# Patient Record
Sex: Female | Born: 1970
Health system: Southern US, Community
[De-identification: ages and names within clinical notes are randomized; demographics above are authoritative.]

## PROBLEM LIST (undated history)

## (undated) DIAGNOSIS — F32A Depression, unspecified: Secondary | ICD-10-CM

## (undated) DIAGNOSIS — E039 Hypothyroidism, unspecified: Secondary | ICD-10-CM

## (undated) DIAGNOSIS — N760 Acute vaginitis: Secondary | ICD-10-CM

## (undated) DIAGNOSIS — E079 Disorder of thyroid, unspecified: Secondary | ICD-10-CM

## (undated) DIAGNOSIS — Z803 Family history of malignant neoplasm of breast: Secondary | ICD-10-CM

## (undated) DIAGNOSIS — I10 Essential (primary) hypertension: Secondary | ICD-10-CM

## (undated) DIAGNOSIS — B9689 Other specified bacterial agents as the cause of diseases classified elsewhere: Secondary | ICD-10-CM

## (undated) DIAGNOSIS — T7840XA Allergy, unspecified, initial encounter: Secondary | ICD-10-CM

## (undated) DIAGNOSIS — G709 Myoneural disorder, unspecified: Secondary | ICD-10-CM

## (undated) DIAGNOSIS — K589 Irritable bowel syndrome without diarrhea: Secondary | ICD-10-CM

## (undated) DIAGNOSIS — E785 Hyperlipidemia, unspecified: Secondary | ICD-10-CM

## (undated) DIAGNOSIS — C801 Malignant (primary) neoplasm, unspecified: Principal | ICD-10-CM

## (undated) DIAGNOSIS — R011 Cardiac murmur, unspecified: Secondary | ICD-10-CM

## (undated) DIAGNOSIS — E119 Type 2 diabetes mellitus without complications: Secondary | ICD-10-CM

## (undated) DIAGNOSIS — F419 Anxiety disorder, unspecified: Secondary | ICD-10-CM

## (undated) DIAGNOSIS — M1990 Unspecified osteoarthritis, unspecified site: Secondary | ICD-10-CM

## (undated) DIAGNOSIS — H814 Vertigo of central origin: Secondary | ICD-10-CM

## (undated) HISTORY — DX: Acute vaginitis: B96.89

## (undated) HISTORY — DX: Allergy, unspecified, initial encounter: T78.40XA

## (undated) HISTORY — DX: Unspecified osteoarthritis, unspecified site: M19.90

## (undated) HISTORY — DX: Type 2 diabetes mellitus without complications: E11.9

## (undated) HISTORY — DX: Malignant (primary) neoplasm, unspecified: C80.1

## (undated) HISTORY — DX: Myoneural disorder, unspecified: G70.9

## (undated) HISTORY — PX: THYROIDECTOMY: SHX17

## (undated) HISTORY — DX: Cardiac murmur, unspecified: R01.1

## (undated) HISTORY — DX: Other specified bacterial agents as the cause of diseases classified elsewhere: B96.89

## (undated) HISTORY — DX: Essential (primary) hypertension: I10

## (undated) HISTORY — DX: Other specified bacterial agents as the cause of diseases classified elsewhere: N76.0

## (undated) HISTORY — PX: CARPAL TUNNEL RELEASE: SHX101

## (undated) HISTORY — DX: Family history of malignant neoplasm of breast: Z80.3

## (undated) HISTORY — DX: Hyperlipidemia, unspecified: E78.5

## (undated) HISTORY — PX: ABLATION: SHX5711

## (undated) HISTORY — DX: Disorder of thyroid, unspecified: E07.9

## (undated) HISTORY — DX: Irritable bowel syndrome, unspecified: K58.9

## (undated) HISTORY — PX: OTHER SURGICAL HISTORY: SHX169

## (undated) HISTORY — PX: WISDOM TOOTH EXTRACTION: SHX21

---

## 1998-07-09 ENCOUNTER — Emergency Department (HOSPITAL_COMMUNITY): Admission: EM | Admit: 1998-07-09 | Discharge: 1998-07-09 | Payer: Self-pay

## 2001-03-18 ENCOUNTER — Emergency Department (HOSPITAL_COMMUNITY): Admission: EM | Admit: 2001-03-18 | Discharge: 2001-03-18 | Payer: Self-pay | Admitting: Emergency Medicine

## 2003-02-11 ENCOUNTER — Encounter: Payer: Self-pay | Admitting: Family Medicine

## 2003-02-11 ENCOUNTER — Ambulatory Visit (HOSPITAL_COMMUNITY): Admission: RE | Admit: 2003-02-11 | Discharge: 2003-02-11 | Payer: Self-pay | Admitting: Family Medicine

## 2007-01-18 ENCOUNTER — Ambulatory Visit (HOSPITAL_COMMUNITY): Admission: AD | Admit: 2007-01-18 | Discharge: 2007-01-18 | Payer: Self-pay | Admitting: Obstetrics and Gynecology

## 2007-01-26 ENCOUNTER — Encounter (INDEPENDENT_AMBULATORY_CARE_PROVIDER_SITE_OTHER): Payer: Self-pay | Admitting: *Deleted

## 2007-01-26 ENCOUNTER — Inpatient Hospital Stay (HOSPITAL_COMMUNITY): Admission: RE | Admit: 2007-01-26 | Discharge: 2007-01-28 | Payer: Self-pay | Admitting: Obstetrics & Gynecology

## 2007-12-06 DIAGNOSIS — C801 Malignant (primary) neoplasm, unspecified: Secondary | ICD-10-CM

## 2007-12-06 HISTORY — DX: Malignant (primary) neoplasm, unspecified: C80.1

## 2007-12-31 ENCOUNTER — Encounter (HOSPITAL_COMMUNITY): Admission: RE | Admit: 2007-12-31 | Discharge: 2008-01-30 | Payer: Self-pay | Admitting: Internal Medicine

## 2008-01-08 ENCOUNTER — Ambulatory Visit (HOSPITAL_COMMUNITY): Admission: RE | Admit: 2008-01-08 | Discharge: 2008-01-08 | Payer: Self-pay | Admitting: Family Medicine

## 2008-01-09 ENCOUNTER — Encounter (INDEPENDENT_AMBULATORY_CARE_PROVIDER_SITE_OTHER): Payer: Self-pay | Admitting: Diagnostic Radiology

## 2008-05-01 ENCOUNTER — Other Ambulatory Visit: Admission: RE | Admit: 2008-05-01 | Discharge: 2008-05-01 | Payer: Self-pay | Admitting: Obstetrics & Gynecology

## 2008-05-23 ENCOUNTER — Ambulatory Visit (HOSPITAL_COMMUNITY): Admission: RE | Admit: 2008-05-23 | Discharge: 2008-05-23 | Payer: Self-pay | Admitting: Obstetrics & Gynecology

## 2008-05-23 ENCOUNTER — Encounter: Payer: Self-pay | Admitting: Obstetrics & Gynecology

## 2008-07-21 ENCOUNTER — Ambulatory Visit (HOSPITAL_COMMUNITY): Admission: RE | Admit: 2008-07-21 | Discharge: 2008-07-22 | Payer: Self-pay | Admitting: General Surgery

## 2008-07-21 ENCOUNTER — Encounter (HOSPITAL_BASED_OUTPATIENT_CLINIC_OR_DEPARTMENT_OTHER): Payer: Self-pay | Admitting: General Surgery

## 2008-09-16 ENCOUNTER — Encounter (HOSPITAL_BASED_OUTPATIENT_CLINIC_OR_DEPARTMENT_OTHER): Payer: Self-pay | Admitting: General Surgery

## 2008-09-16 ENCOUNTER — Ambulatory Visit (HOSPITAL_COMMUNITY): Admission: RE | Admit: 2008-09-16 | Discharge: 2008-09-17 | Payer: Self-pay | Admitting: General Surgery

## 2008-10-10 ENCOUNTER — Ambulatory Visit (HOSPITAL_COMMUNITY): Admission: RE | Admit: 2008-10-10 | Discharge: 2008-10-10 | Payer: Self-pay | Admitting: Endocrinology

## 2008-10-15 ENCOUNTER — Encounter (HOSPITAL_COMMUNITY): Admission: RE | Admit: 2008-10-15 | Discharge: 2008-12-02 | Payer: Self-pay | Admitting: Internal Medicine

## 2008-10-17 ENCOUNTER — Encounter (HOSPITAL_COMMUNITY): Admission: RE | Admit: 2008-10-17 | Discharge: 2008-12-02 | Payer: Self-pay | Admitting: Endocrinology

## 2009-04-16 ENCOUNTER — Ambulatory Visit (HOSPITAL_COMMUNITY): Admission: RE | Admit: 2009-04-16 | Discharge: 2009-04-16 | Payer: Self-pay | Admitting: Obstetrics & Gynecology

## 2009-05-07 ENCOUNTER — Other Ambulatory Visit: Admission: RE | Admit: 2009-05-07 | Discharge: 2009-05-07 | Payer: Self-pay | Admitting: Obstetrics & Gynecology

## 2009-09-14 ENCOUNTER — Ambulatory Visit (HOSPITAL_COMMUNITY): Admission: RE | Admit: 2009-09-14 | Discharge: 2009-09-14 | Payer: Self-pay | Admitting: Family Medicine

## 2009-12-21 ENCOUNTER — Encounter (HOSPITAL_COMMUNITY): Admission: RE | Admit: 2009-12-21 | Discharge: 2010-03-17 | Payer: Self-pay | Admitting: Endocrinology

## 2010-07-01 ENCOUNTER — Other Ambulatory Visit: Admission: RE | Admit: 2010-07-01 | Discharge: 2010-07-01 | Payer: Self-pay | Admitting: Obstetrics & Gynecology

## 2010-11-22 ENCOUNTER — Encounter (HOSPITAL_COMMUNITY)
Admission: RE | Admit: 2010-11-22 | Discharge: 2011-01-04 | Payer: Self-pay | Source: Home / Self Care | Attending: Endocrinology | Admitting: Endocrinology

## 2010-12-26 ENCOUNTER — Encounter: Payer: Self-pay | Admitting: Obstetrics and Gynecology

## 2010-12-26 ENCOUNTER — Encounter: Payer: Self-pay | Admitting: Internal Medicine

## 2011-01-10 ENCOUNTER — Other Ambulatory Visit (HOSPITAL_COMMUNITY): Payer: Self-pay | Admitting: Internal Medicine

## 2011-01-10 ENCOUNTER — Ambulatory Visit (HOSPITAL_COMMUNITY)
Admission: RE | Admit: 2011-01-10 | Discharge: 2011-01-10 | Disposition: A | Discharge status: Home / Self Care | Payer: 59 | Source: Ambulatory Visit | Attending: Internal Medicine | Admitting: Internal Medicine

## 2011-01-10 DIAGNOSIS — W19XXXA Unspecified fall, initial encounter: Secondary | ICD-10-CM | POA: Insufficient documentation

## 2011-01-10 DIAGNOSIS — M79644 Pain in right finger(s): Secondary | ICD-10-CM

## 2011-01-10 DIAGNOSIS — S6990XA Unspecified injury of unspecified wrist, hand and finger(s), initial encounter: Secondary | ICD-10-CM | POA: Insufficient documentation

## 2011-01-10 DIAGNOSIS — M79609 Pain in unspecified limb: Secondary | ICD-10-CM | POA: Insufficient documentation

## 2011-01-10 DIAGNOSIS — S6980XA Other specified injuries of unspecified wrist, hand and finger(s), initial encounter: Secondary | ICD-10-CM | POA: Insufficient documentation

## 2011-02-14 LAB — HCG, SERUM, QUALITATIVE

## 2011-03-31 ENCOUNTER — Other Ambulatory Visit: Payer: Self-pay | Admitting: Obstetrics & Gynecology

## 2011-03-31 DIAGNOSIS — Z139 Encounter for screening, unspecified: Secondary | ICD-10-CM

## 2011-04-07 ENCOUNTER — Ambulatory Visit (HOSPITAL_COMMUNITY)
Admission: RE | Admit: 2011-04-07 | Discharge: 2011-04-07 | Disposition: A | Discharge status: Home / Self Care | Payer: 59 | Source: Ambulatory Visit | Attending: Obstetrics & Gynecology | Admitting: Obstetrics & Gynecology

## 2011-04-07 DIAGNOSIS — Z1231 Encounter for screening mammogram for malignant neoplasm of breast: Secondary | ICD-10-CM | POA: Insufficient documentation

## 2011-04-07 DIAGNOSIS — Z139 Encounter for screening, unspecified: Secondary | ICD-10-CM

## 2011-04-19 NOTE — Op Note (Signed)
[name], [name]                 ACCOUNT NO.:  [id number]   MEDICAL RECORD NO.:  [id number]          PATIENT TYPE:  AMB   LOCATION:  DAY                          FACILITY:  [facility]   PHYSICIAN:  Leonie Man, M.D.   DATE OF BIRTH:  Oct 06, 1971   DATE OF PROCEDURE:  09/16/2008  DATE OF DISCHARGE:                               OPERATIVE REPORT   PREOPERATIVE DIAGNOSIS:  Papillary carcinoma of the thyroid.   POSTOPERATIVE DIAGNOSIS:  Papillary carcinoma of the thyroid.   PROCEDURE:  Completion left hemithyroidectomy.   SURGEON:  Leonie Man, M.D.   ASSISTANT:  Ollen Gross. Vernell Morgans, M.D.   ANESTHESIA:  General.   INDICATIONS:  The patient is a 40 year old female presenting originally  with a follicular lesion of the right thyroid gland.  This patient also  had a history of a hyperactive thyroid gland which had been controlled  on methimazole.  Associated with this hyperactive gland was a cold  nodule in the lower pole.  Biopsy of the nodule showed this to be a  follicular lesion with uniform cells and without any specific evidence  suggestive of carcinoma.  The patient consequently underwent right-sided  hemithyroidectomy.  The resulting pathology report showed this to be a  papillary carcinoma of the thyroid with a follicular variant with two  nodules within the right thyroid measuring 1.8 and 0.3 cm respectively.  It was noted that the tumor extended to the margins of resection.  Consequently the patient is now brought back to the operating room for  completion left-sided hemithyroidectomy.  The risks and potential  benefits of surgery have been fully discussed with her.  She understands  and gives her consent to same.   PROCEDURE IN DETAIL:  The patient is positioned supinely following  induction of satisfactory general anesthesia.  The head and neck were  hyperextended and the neck is prepped and draped to be included in a  sterile operative field.  The patient is identified  as [name] and  the procedure to be done as left hemithyroidectomy.   A transverse collar incision made through the old incision line was  deepened through the skin and subcutaneous tissues down to the platysma  muscles.  The platysma muscles are transected and a superior  myocutaneous flap was raised to the thyroid cartilage and an inferior  myocutaneous flap carried down to the sternal notch.  The strap muscles  are opened in their midline.  There was significant scarring under the  flaps.  However, on dissection of the strap muscles laterally onto the  left side there was significantly less scarring.  The left thyroid gland  was mobilized from the sulcus of the neck and dissection then carried up  toward the superior pole.  The superior pole of the thyroid was  isolated, mobilized and doubly tied with 2-0 silk.  A medium clip was  placed above this staying silk suture and the upper pole vessels were  transected using the Harmonic scalpel.  The remainder of the thyroid  gland was carefully dissected out of the neck.  I think  we identified  both the upper, the superior and inferior parathyroid glands and spared  these.  The recurrent laryngeal nerve was positively identified and  protected throughout the course of the dissection.  Dissection was  carried from lateral to medially and the dissection carried down to the  trachea where the thyroid gland was dissected free from the trachea  carrying it medially over and involving the isthmus.  This was removed  and forwarded for pathologic evaluation with a suture marking the region  of the superior pole of the left thyroid gland.  The neck was then  thoroughly irrigated with saline.  Additional bleeding points were  treated with electrocautery and sponge and instrument counts were  verified.  Surgicel pads were placed over all areas of dissection.  The  strap muscles were closed in the midline with interrupted 3-0 Vicryl  sutures and  the platysma muscle were closed with 3-0 Vicryl sutures and  the skin was closed with running 5-0 Monocryl suture.  This was  reinforced with Steri-Strips and a sterile dressing was applied.  The  anesthetic reversed.  The patient removed from the operating room to the  recovery room in stable condition.  She tolerated the procedure well.      Leonie Man, M.D.  Electronically Signed     PB/MEDQ  D:  09/16/2008  T:  09/16/2008  Job:  [id number]

## 2011-04-19 NOTE — Op Note (Signed)
NAMEFREDI, HURTADO                 ACCOUNT NO.:  [id number]   MEDICAL RECORD NO.:  [id number]          PATIENT TYPE:  AMB   LOCATION:  DAY                          FACILITY:  [facility]   PHYSICIAN:  Leonie Man, M.D.   DATE OF BIRTH:  20-Sep-1971   DATE OF PROCEDURE:  07/21/2008  DATE OF DISCHARGE:                               OPERATIVE REPORT   PREOPERATIVE DIAGNOSIS:  Follicular lesion right thyroid.   POSTOPERATIVE DIAGNOSIS:  Follicular lesion right thyroid.   PROCEDURE:  Right hemithyroidectomy.   SURGEON:  Leonie Man, M.D.   ASSISTANT:  Juanetta Gosling, MD   ANESTHESIA:  General.   SPECIMENS TO LAB:  Right thyroid lobe.   ESTIMATED BLOOD LOSS:  50 mL.   COMPLICATIONS:  None apparent.  The patient returned to the PACU in  excellent condition.   INDICATIONS FOR PROCEDURE:  The patient is a 41 year old female with a  hot nodule in the  right lower pole of the thyroid gland.  She had been  on Methimazole.  Biopsies of this lesion did not show any evidence  suggestive of carcinoma.  The patient comes to the operating room now  for right-sided thyroid lobectomy after risks and potential benefits of  surgery had been fully discussed with her.  These include the risks of  hypoparathyroidism and recurrent laryngeal nerve injury as well as  bleeding and infection.   DESCRIPTION OF PROCEDURE:  Following induction of satisfactory general  anesthesia, the patient was positioned supinely and her neck is prepped  and draped to be included in a sterile operative field.  Positive  identification of the patient and the procedure to be done as right  thyroid lobectomy..  The usual surgical precautions and all requirements  have been met and varified..  I then made an incision in the neck approximately two fingerbreadths  above the sternal notch, deepened this through skin and subcutaneous  tissues down across the platysma muscle.  The superior myocutaneous flap  was raised to  the thyroid cartilage and an inferior myocutaneous flap  raised to the sternal notch.  The midline strap muscles are divided  vertically and dissection carried over the right side; with isolation of  the thyroid nodule the right thyroid lobe is then dissected free up to  the upper pole and the superior pole vessels were taken between clamps  and secured with ties of 2-0 silk and with clips.  The right upper pole  parathyroid was not positively identified.  Dissection was carried down  keeping close to the thyroid capsule.  The lower pole parathyroid was  positively identified as was the recurrent laryngeal nerve.  All of  these were protected throughout the course of dissection.  Dissection  was carried medially across the anterior trachea.  The thyroid isthmus  was then transected with the harmonic scalpel and the right thyroid lobe  removed and forwarded for pathologic evaluation.  Superior pole was  marked with a stitch.  All areas of dissection were then checked for  hemostasis.  Sponge and instrument counts were verified.  Additional  bleeding  points treated with electrocautery.  All areas of dissection  were then covered with Surgicel gauze and the wound closed in layers as  follows.  Midline strap muscles closed with interrupted 3-0 Vicryl  sutures.  Platysma muscle closed with interrupted 3-0 Vicryl sutures.  Skin closed with running 5-0 Monocryl suture and reinforced with Steri-  Strips.  A sterile dressings applied.  The anesthetic reversed.  The  patient removed from the operating room to the recovery room in stable  condition.  She tolerated the procedure well.      Leonie Man, M.D.  Electronically Signed     PB/MEDQ  D:  07/21/2008  T:  07/21/2008  Job:  [id number]   cc:   Leonie Man, M.D.  [address]  [address]  [place]  [place] [address]

## 2011-04-19 NOTE — Op Note (Signed)
NAMECARMELA, [name]                 ACCOUNT NO.:  [id number]   MEDICAL RECORD NO.:  [id number]          PATIENT TYPE:  AMB   LOCATION:  DAY                           FACILITY:  [facility]   PHYSICIAN:  Lazaro Arms, M.D.   DATE OF BIRTH:  Oct 24, 1971   DATE OF PROCEDURE:  05/23/2008  DATE OF DISCHARGE:                               OPERATIVE REPORT   PREOPERATIVE DIAGNOSES:  1. Menometrorrhagia.  2. Dysmenorrhea.   POSTOPERATIVE DIAGNOSES:  1. Menometrorrhagia.  2. Dysmenorrhea.   PROCEDURE:  Hysteroscopy, D&C, and endometrial ablation.   SURGEON:  Lazaro Arms, MD   ANESTHESIA:  General endotracheal.   FINDINGS:  The patient had normal endometrial cavity.  No polyps, no  fibroids, no abnormalities.   DESCRIPTION OF OPERATION:  The patient was taken to the operating room,  placed in supine position where she underwent general endotracheal  tracheal anesthesia, placed in lithotomy position, prepped and draped in  usual sterile fashion.  A Graves speculum was placed, cervix was  grasped.  The cervix was dilated serially to allow passage of the  hysteroscope.  Hysteroscopy was performed and again there was no  fibroids.  No polyps, no endometrial abnormalities whatsoever.  She had  been on Megace preoperatively.  Vigorous uterine curettage was then  performed.  Again uterine cryo was obtained in all areas.  ThermaChoice  III endometrial ablation balloon was used, 19 mL of D5W was required to  maintain a pressure between 192 mmHg throughout the procedure.  It was  heated to 87 degrees Celsius.  Total therapy time was 13.5 minutes.  All  the fluid was returned at the end of procedure.  The patient tolerated  the procedure well.  She experienced minimal blood loss and was taken to  the recovery room in good/stable condition.  All counts were correct x3.      Lazaro Arms, M.D.  Electronically Signed     LHE/MEDQ  D:  05/23/2008  T:  05/23/2008  Job:  [id number]

## 2011-04-22 NOTE — Group Therapy Note (Signed)
NAMETORRA, PALA                 ACCOUNT NO.:  [id number]   MEDICAL RECORD NO.:  [id number]          PATIENT TYPE:  OIB   LOCATION:  LDR1                          FACILITY:  [facility]   PHYSICIAN:  Tilda Burrow, M.D. DATE OF BIRTH:  [birth year]   DATE OF PROCEDURE:  DATE OF DISCHARGE:                                 PROGRESS NOTE   [name] is about 38-and-a-half weeks pregnant with her second baby.  Came  in with complaints of prodromal contractions that she has been having  since yesterday, stating that they are a little bit more comfortable  today.  She was checked in the office yesterday and her outer os was 5  and the inner os was 2 (sic. -2 station).  She is pretty much the same  today.  She is having some mild irregular contractions so we will keep  her for a little while and observe her for labor.  Fetal heart rate is  reactive without decelerations.      [name], C.N.M.      Tilda Burrow, M.D.  Electronically Signed    FC/MEDQ  D:  01/18/2007  T:  01/18/2007  Job:  [id number]

## 2011-04-22 NOTE — Discharge Summary (Signed)
[name], [name]                 ACCOUNT NO.:  [id number]   MEDICAL RECORD NO.:  [id number]          PATIENT TYPE:  INP   LOCATION:  A413                          FACILITY:  [facility]   PHYSICIAN:  Lazaro Arms, M.D.   DATE OF BIRTH:  06-Sep-1971   DATE OF ADMISSION:  01/26/2007  DATE OF DISCHARGE:  02/24/2008LH                               DISCHARGE SUMMARY   DISCHARGE DIAGNOSES:  1. Status post a primary cesarean section with bilateral tubal      ligation.  2. Class A-II diabetes mellitus.  3. Unremarkable postoperative course.   PROCEDURE:  Primary cesarean section with bilateral tubal ligation.   SURGEON:  Lazaro Arms, M.D.   ANESTHESIA:  Spinal.   Please refer to the history and physical and antepartum chart for  details of admission to the hospital.   HOSPITAL COURSE:  Patient underwent a primary c-section because of the  fetal vertex being out of the pelvis, and she is class A-II diabetic,  and the estimated fetal weight was close to 4,000 grams.  Intraoperative  course was unremarkable.  Please see the op note for details.  Postoperatively, the patient did well.  She tolerated clear liquids and  a regular diet, voided without symptoms, was ambulatory.  She had  progression with normal flatus and tolerated __________  in her diet.  She has taken oral pain medicine.  Her incision is clean, dry, intact.  Hemoglobin and hematocrit are stable.  She is discharged to home on the  afternoon of postoperative day #2 in good stable condition.  Followup at  the office next week as scheduled for incision check.      Lazaro Arms, M.D.  Electronically Signed     LHE/MEDQ  D:  01/28/2007  T:  01/28/2007  Job:  [id number]

## 2011-04-22 NOTE — H&P (Signed)
[name], [name]                 ACCOUNT NO.:  [id number]   MEDICAL RECORD NO.:  [id number]          PATIENT TYPE:  AMB   LOCATION:  DAY                           FACILITY:  [facility]   PHYSICIAN:  Tilda Burrow, M.D. DATE OF BIRTH:  September 10, 1971   DATE OF ADMISSION:  DATE OF DISCHARGE:  LH                              HISTORY & PHYSICAL   ADMITTING DIAGNOSES:  1. Pregnancy, 40 weeks' gestation.  2. Gestational diabetes.  3. Suspected fetal macrosomia.   HISTORY OF PRESENT ILLNESS:  This 40 year old female, gravida 2, para 1,  Ab0, LMP Apr 22, 2006, has menstrual EDC of January 27, 2007.  She has  ultrasounds which correspond to that date, February 27 and February 18  at 7 and 20 weeks, respectively.  She is admitted for primary cesarean  section due to suspected fetal macrosomia after a pregnancy followed  through our office since July with excellent diabetic control with  hemoglobin A1c documented at 5.3 in November with blood sugars  acceptable on serial reviewed at office appointments.  Her most recent  insulin doses are 8 units of regular and 20 units of NPH q.a.m., 10  units of regular in the afternoon and 20 units of NPH at bedtime.  She  has a 43-cm fundal height and is admitted for primary cesarean section  by Dr. Turner Daniels.   PRENATAL LABORATORY DATA:  Blood type A positive.  UDS negative.  Rubella immune at present.  Hemoglobin 12, hematocrit 36.  Hepatitis,  HIV, RPR, GC and Chlamydia all negative.  MSAFP was declined.  Glucose  tolerance test was performed at 28 weeks and was 176 with abnormal 3-  hour glucose tolerance test and insulin was begun in November with 20  units of NPH and 10 of regular and has gradually been increased.  She  plans to breast-feed, desires circumcision and will take the baby to Dr.  Phillips Odor at [facility] after the hospital care.   PAST MEDICAL HISTORY:  Headaches.   SURGICAL HISTORY:  1. Carpal tunnel on the right.  2. Dental  extraction.   ALLERGIES:  SULFA and ASPIRIN.   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  Her  1st child was delivered by Dr. Gilford Silvius.   PHYSICAL EXAMINATION:  VITAL SIGNS:  Weight 228, which is a 14-pound  weight gain.  Blood pressure 160/90, increased over the last 2 visits.  Urine protein is negative.  ABDOMEN:  Fundal height is 43 cm; singleton vertex presentation is  confirmed.   PLAN:  Primary cesarean section by Dr. Turner Daniels on January 27, 2007 at  noon.      Tilda Burrow, M.D.  Electronically Signed     Tilda Burrow, M.D.  Electronically Signed    JVF/MEDQ  D:  01/25/2007  T:  01/26/2007  Job:  [id number]   cc:   Francoise Schaumann. Raynelle Highland  Fax: [phone]   Corrie Mckusick, M.D.  Fax: [phone]

## 2011-04-22 NOTE — Op Note (Signed)
NAMEMIZUKI, HOEL                 ACCOUNT NO.:  [id number]   MEDICAL RECORD NO.:  [id number]          PATIENT TYPE:  AMB   LOCATION:  DAY                           FACILITY:  [facility]   PHYSICIAN:  Lazaro Arms, M.D.   DATE OF BIRTH:  05/03/1971   DATE OF PROCEDURE:  01/26/2007  DATE OF DISCHARGE:                               OPERATIVE REPORT   PREOPERATIVE DIAGNOSIS:  1. Intrauterine pregnancy at [redacted] weeks gestation.  2. Class A2 diabetes mellitus.  3. Desires sterilization.  4. Fetal vertex floating out of the pelvis.  5. Left abdominal mole.   POSTOPERATIVE DIAGNOSIS:  1. Intrauterine pregnancy at [redacted] weeks gestation.  2. Class A2 diabetes mellitus.  3. Desires sterilization.  4. Fetal vertex floating out of the pelvis.  5. Left abdominal mole.   PROCEDURE:  1. Primary low transverse cesarean section with bilateral tubal      ligation.  2. Removal of abdominal mole.   SURGEON:  Lazaro Arms, M.D.   ANESTHESIA:  Spinal.   FINDINGS:  Over a low transverse hysterotomy incision was delivered a  viable female infant at 61 with Apgars of 8 and 9 weighing 8 pounds 12  ounces.  There was a 3 vessel cord.  Cord blood and cord gas were sent.  The placenta was sent routinely.  The uterus, tubes, and ovaries were  normal.   DESCRIPTION OF PROCEDURE:  The patient was taken to the operating room  and placed in the sitting position where she underwent a spinal  anesthetic.  She was  placed in a supine position with a roll under her  right hip.  She was prepped and draped in the usual sterile fashion.  A  Pfannenstiel skin incision was made and carried down sharply to the  rectus fascia which was scored in the midline and extended laterally.  The fascia was taken off the muscles superiorly and inferiorly without  difficulty.  The muscles were divided, the peritoneal cavity was  entered.  A large protractor was placed.  A vesicouterine serosal flap  was created.  A low transverse  hysterotomy incision was made.  Over this  incision was delivered a viable female infant at 69 with Apgars of 8 and  9.  There was a three vessel cord.  Cord blood and cord gas were sent.  The infant underwent routine neonatal resuscitation.  Dr. Milford Cage was  present.  The placenta was delivered spontaneously and sent to pathology  per routine.  The uterus was exteriorized and wiped clean with a clean  lap pad.  It was closed in two layers, the first being a running  interlocking layer, the second being an imbricating layer.  A modified  Pomeroy bilateral tubal ligation was performed in the usual fashion  using 2-0 plain gut suture.  An approximately 2 cm segment was removed  from either side and sent to pathology for evaluation.  There was good  hemostasis.  The uterus was replaced in the peritoneal cavity.  The  protractor was removed.  The peritoneal cavity was irrigated and  hemostasis was confirmed.  The muscles and peritoneum were  reapproximated loosely.  The fascia was closed using 0 Vicryl running,  subcutaneous tissues were made hemostatic and irrigated.  The  subcutaneous tissue was then reapproximated using 2-0 plain gut.  The  skin was closed using 3-0 Vicryl in a subcuticular fashion and Dermabond  was placed for additional skin reapproximated as well as infection  barrier.  A small mole was taken off of her left abdomen sharply with a  15 blade in an elliptical fashion. One single interrupted suture was  placed and Dermabond was then placed.  The patient tolerated the  procedure well.  She experienced 750 mL of blood loss.  She was taken to  the recovery room in good, stable condition.  All counts were correct  x3.      Lazaro Arms, M.D.  Electronically Signed     LHE/MEDQ  D:  01/26/2007  T:  01/26/2007  Job:  [id number]

## 2011-05-18 ENCOUNTER — Encounter (HOSPITAL_BASED_OUTPATIENT_CLINIC_OR_DEPARTMENT_OTHER)
Admission: RE | Admit: 2011-05-18 | Discharge: 2011-05-18 | Disposition: A | Discharge status: Home / Self Care | Payer: Worker's Compensation | Source: Ambulatory Visit | Attending: Orthopedic Surgery | Admitting: Orthopedic Surgery

## 2011-05-18 LAB — BASIC METABOLIC PANEL
CO2: 25 mEq/L (ref 19–32)
Calcium: 8.1 mg/dL — ABNORMAL LOW (ref 8.4–10.5)
Chloride: 105 mEq/L (ref 96–112)
Creatinine, Ser: 0.67 mg/dL (ref 0.4–1.2)
GFR calc non Af Amer: >60 mL/min (ref >60)
Glucose, Bld: 134 mg/dL — ABNORMAL HIGH (ref 70–99)
Potassium: 4 mEq/L (ref 3.5–5.1)

## 2011-05-20 ENCOUNTER — Ambulatory Visit (HOSPITAL_BASED_OUTPATIENT_CLINIC_OR_DEPARTMENT_OTHER)
Admission: RE | Admit: 2011-05-20 | Discharge: 2011-05-20 | Disposition: A | Discharge status: Home / Self Care | Payer: Worker's Compensation | Source: Ambulatory Visit | Attending: Orthopedic Surgery | Admitting: Orthopedic Surgery

## 2011-05-20 DIAGNOSIS — Z01812 Encounter for preprocedural laboratory examination: Secondary | ICD-10-CM | POA: Insufficient documentation

## 2011-05-20 DIAGNOSIS — G5610 Other lesions of median nerve, unspecified upper limb: Secondary | ICD-10-CM | POA: Insufficient documentation

## 2011-05-20 DIAGNOSIS — Z87891 Personal history of nicotine dependence: Secondary | ICD-10-CM | POA: Insufficient documentation

## 2011-05-20 DIAGNOSIS — E119 Type 2 diabetes mellitus without complications: Secondary | ICD-10-CM | POA: Insufficient documentation

## 2011-05-20 DIAGNOSIS — E669 Obesity, unspecified: Secondary | ICD-10-CM | POA: Insufficient documentation

## 2011-05-24 NOTE — Op Note (Signed)
NAMEYUNUEN, MORDAN                 ACCOUNT NO.:  [id number]  MEDICAL RECORD NO.:  [id number]  LOCATION:                                 FACILITY:  PHYSICIAN:  Katy Fitch. Nawaal Alling, M.D. DATE OF BIRTH:  1971/07/29  DATE OF PROCEDURE:  05/20/2011 DATE OF DISCHARGE:                              OPERATIVE REPORT   PREOPERATIVE DIAGNOSES:  Chronic neuropathic pain with residual median neuropathy, possible entrapment neuropathy symptoms right hand status post carpal tunnel release in 2006.  POSTOPERATIVE DIAGNOSES:  Chronic neuropathic pain with residual median neuropathy, possible entrapment neuropathy symptoms right hand status post carpal tunnel release in 2006.  OPERATION:  Re-exploration of carpal tunnel with external neurolysis of right median nerve at wrist and mid palm, hypothenar fat pedicle transfer for coverage of median nerve and prevention of recurrent neurodesis.  OPERATING SURGEON:  Katy Fitch. Evie Croston, MD  ASSISTANT:  Marveen Reeks Dasnoit, PA-C  ANESTHESIA:  General by LMA.  SUPERVISING ANESTHESIOLOGIST:  Achille Rich, MD  INDICATIONS:  [name] is a 40 year old right hand dominant woman referred for an independent medical evaluation several years ago following a complicated right carpal tunnel release.  She had undergone a release of her right transverse carpal ligament for median entrapment neuropathy symptoms at [facility] in October 2006.  Immediately following surgery her general sense of numbness improved, however, over time she began to experience neuropathic pain and recurrent episodes of numbness in the median distribution.  She was referred for an independent medical evaluation at which time we noted that she had a rather radial incision with her scar directly superficial to the location of the median nerve.  She appeared to have some nerve traction symptoms.  We have treated her conservatively for more than 2 years with the use of Neurontin and  nerve mobilization technique.  She was sent for followup electrodiagnostic study with Dr. Vela Prose, an independent neurologist, who made the diagnosis of recurrent carpal tunnel syndrome and probable neuropathic symptoms.  After a lengthy period of use of Neurontin and after detailed informed consent during which she had a third orthopedic opinion with Dr. Amanda Pea, she ultimately elected to proceed with re-exploration of her carpal canal and anticipated isolation of the nerve and placement of a hypothenar fat pad pedicle to prevent recurrent neurodesis.  She presents for that procedure at this time.  Preoperatively, her past medical history was updated.  She is allergic to SULFA and ASPIRIN.  She has a history of type 2 diabetes and is treated with metformin.  She has discontinued her Neurontin at this time.  She also has a history of hypothyroidism and is on Synthroid replacement therapy.  Preoperatively questions were invited and answered in detail with her husband present.  Preoperative glucose was noted to be 134 random.  PROCEDURE:  [name] was brought to room one of the [facility] and placed in supine position upon the operating table.  Following an anesthesia, informed consent with Dr. Chaney Malling, general anesthesia by LMA technique was recommended and accepted.  Under Dr. Seward Meth direct supervision general anesthesia by LMA technique was induced followed by routine Betadine scrub and paint of the right  upper extremity.  A pneumatic tourniquet was applied to the proximal right brachium.  Following exsanguination of the right arm with an Esmarch bandage, arterial tourniquet was inflated to 220 mmHg.  Routine surgical time-out was accomplished.  The procedure began with a careful resection of the previous surgical scar.  This was excised and passed off to be discarded.  The incision was extended ulnarly in the distal palmar crease creating a hypothenar  flap that could be elevated.  With great care, the common sensory branch of the median nerve were identified at mid palm and with the aid of a Penfield four elevator, the median nerve separated from the deep surface of the scarred transverse carpal ligament.  We released the scar on its ulnar aspect so as to not directly create an incision directly over the median nerve.  With great care, an external neurolysis of the median nerve was accomplished releasing dense adhesions to the radial/thenar flap of the prior transverse carpal ligament release followed by neurolysis of the median nerve into the distal forearm facilitated by use of a Senn retractor elevating the forearm skin.  The nerve was fully decompressed 4 cm above the wrist and wrist range of motion exercises were accomplished to ensure that the nerve had satisfactory gliding after complete external neurolysis.  The motor branch was inspected and found to be normal.  The common sensory branches were invested in scar, but otherwise normal.  No internal neurolysis nor release of the perineurium was accomplished.  Hemostasis was achieved with bipolar electrocautery followed by elevation of a hypothenar fat pad pedicle with undermining of the dermis, taking care to avoid the palmaris brevis muscle.  A pedicle approximately 2.5 cm in width was ultimately elevated, rotated to 180 degrees and sutured to the deep surface of the radial flap of the prior transverse carpal ligament scar.  This created a connective tissue and adipose tissue pad that sequestered the nerve from the overlying dermis.  Interrupted sutures were used to tack this in position followed by running suture to the more superficial aspect of the ligament to ensure that did not shift.  The skin was then repaired with subcutaneous 4-0 Vicryl and intradermal 3-0 Prolene segmental sutures.  There were no apparent complications.  For aftercare, 2% lidocaine was  infiltrated into the margin of the wound as well as over the proximal aspect of the median nerve at the distal wrist.  The wound was dressed with Steri-Strips, sterile gauze, sterile Webril, and a volar plaster splint maintaining the wrist in 20 degrees of dorsiflexion.  There were apparent complications.  [name] tolerated surgery and anesthesia well.  She was transferred to the recovery room with stable signs.  She will be discharged to the care of her husband with prescription for Percocet 5 mg one p.o. q.4-6 h. p.r.n. pain, also due to her history of diabetes and a second procedure, she is placed on Keflex 500 mg one p.o. q.8 h. x3 days as prophylactic antibiotic.     Katy Fitch [name], M.D.     RVS/MEDQ  D:  05/20/2011  T:  05/20/2011  Job:  [id number]  Electronically Signed by Josephine Igo M.D. on 05/24/2011 08:29:32 AM

## 2011-08-22 ENCOUNTER — Other Ambulatory Visit: Payer: Self-pay | Admitting: Obstetrics & Gynecology

## 2011-08-22 ENCOUNTER — Other Ambulatory Visit (HOSPITAL_COMMUNITY)
Admission: RE | Admit: 2011-08-22 | Discharge: 2011-08-22 | Disposition: A | Discharge status: Home / Self Care | Payer: 59 | Source: Ambulatory Visit | Attending: Obstetrics & Gynecology | Admitting: Obstetrics & Gynecology

## 2011-08-22 DIAGNOSIS — Z01419 Encounter for gynecological examination (general) (routine) without abnormal findings: Secondary | ICD-10-CM | POA: Insufficient documentation

## 2011-09-01 LAB — URINALYSIS, ROUTINE W REFLEX MICROSCOPIC
Hgb urine dipstick: NEGATIVE
Specific Gravity, Urine: >1.03 — ABNORMAL HIGH
Urobilinogen, UA: 0.2

## 2011-09-01 LAB — COMPREHENSIVE METABOLIC PANEL
Albumin: 4.4
BUN: 12
Chloride: 111
Creatinine, Ser: 0.97
Glucose, Bld: 144 — ABNORMAL HIGH
Total Bilirubin: 0.6
Total Protein: 7.1

## 2011-09-01 LAB — HCG, QUANTITATIVE, PREGNANCY: hCG, Beta Chain, Quant, S: <2

## 2011-09-01 LAB — CBC
HCT: 38
MCV: 86.6
Platelets: 186
RDW: 14.6

## 2011-09-02 LAB — CBC
Hemoglobin: 13.8
MCHC: 34.4
Platelets: 170
RDW: 13.9

## 2011-09-02 LAB — DIFFERENTIAL
Basophils Relative: 1
Eosinophils Absolute: 0.2
Lymphs Abs: 2.4
Monocytes Absolute: 0.5
Monocytes Relative: 6

## 2011-09-02 LAB — URINALYSIS, ROUTINE W REFLEX MICROSCOPIC
Bilirubin Urine: NEGATIVE
Ketones, ur: NEGATIVE
Nitrite: NEGATIVE
Protein, ur: NEGATIVE
Urobilinogen, UA: 0.2
pH: 5.5

## 2011-09-02 LAB — COMPREHENSIVE METABOLIC PANEL
ALT: 26
Albumin: 4.1
Alkaline Phosphatase: 54
Calcium: 9.9
GFR calc Af Amer: >60
Potassium: 4.1
Sodium: 141
Total Protein: 6.8

## 2011-09-02 LAB — PROTIME-INR: INR: 1

## 2011-09-06 LAB — CALCIUM
Calcium: 7.9 — ABNORMAL LOW
Calcium: 8.2 — ABNORMAL LOW

## 2011-09-06 LAB — HCG, SERUM, QUALITATIVE: Preg, Serum: NEGATIVE

## 2011-09-06 LAB — HEMOGLOBIN AND HEMATOCRIT, BLOOD: HCT: 37.6

## 2011-09-06 LAB — PREGNANCY, URINE: Preg Test, Ur: NEGATIVE

## 2012-04-17 ENCOUNTER — Other Ambulatory Visit: Payer: Self-pay | Admitting: Obstetrics & Gynecology

## 2012-04-17 DIAGNOSIS — Z139 Encounter for screening, unspecified: Secondary | ICD-10-CM

## 2012-04-23 ENCOUNTER — Ambulatory Visit (HOSPITAL_COMMUNITY)
Admission: RE | Admit: 2012-04-23 | Discharge: 2012-04-23 | Disposition: A | Discharge status: Home / Self Care | Payer: 59 | Source: Ambulatory Visit | Attending: Obstetrics & Gynecology | Admitting: Obstetrics & Gynecology

## 2012-04-23 DIAGNOSIS — Z139 Encounter for screening, unspecified: Secondary | ICD-10-CM

## 2012-04-23 DIAGNOSIS — Z1231 Encounter for screening mammogram for malignant neoplasm of breast: Secondary | ICD-10-CM | POA: Insufficient documentation

## 2012-05-24 ENCOUNTER — Ambulatory Visit (HOSPITAL_COMMUNITY)
Admission: RE | Admit: 2012-05-24 | Discharge: 2012-05-24 | Disposition: A | Discharge status: Home / Self Care | Payer: 59 | Source: Ambulatory Visit | Attending: Physician Assistant | Admitting: Physician Assistant

## 2012-05-24 ENCOUNTER — Other Ambulatory Visit (HOSPITAL_COMMUNITY): Payer: Self-pay | Admitting: Physician Assistant

## 2012-05-24 DIAGNOSIS — M7750 Other enthesopathy of unspecified foot: Secondary | ICD-10-CM | POA: Insufficient documentation

## 2012-05-24 DIAGNOSIS — M19079 Primary osteoarthritis, unspecified ankle and foot: Secondary | ICD-10-CM | POA: Insufficient documentation

## 2012-05-24 DIAGNOSIS — M7730 Calcaneal spur, unspecified foot: Secondary | ICD-10-CM | POA: Insufficient documentation

## 2012-05-24 DIAGNOSIS — M79609 Pain in unspecified limb: Secondary | ICD-10-CM | POA: Insufficient documentation

## 2012-10-23 ENCOUNTER — Other Ambulatory Visit: Payer: Self-pay | Admitting: Obstetrics & Gynecology

## 2012-10-23 ENCOUNTER — Other Ambulatory Visit (HOSPITAL_COMMUNITY)
Admission: RE | Admit: 2012-10-23 | Discharge: 2012-10-23 | Disposition: A | Discharge status: Home / Self Care | Payer: 59 | Source: Ambulatory Visit | Attending: Obstetrics & Gynecology | Admitting: Obstetrics & Gynecology

## 2012-10-23 DIAGNOSIS — Z1151 Encounter for screening for human papillomavirus (HPV): Secondary | ICD-10-CM | POA: Insufficient documentation

## 2012-10-23 DIAGNOSIS — Z01419 Encounter for gynecological examination (general) (routine) without abnormal findings: Secondary | ICD-10-CM | POA: Insufficient documentation

## 2013-06-26 ENCOUNTER — Other Ambulatory Visit: Payer: Self-pay | Admitting: Obstetrics & Gynecology

## 2013-06-26 NOTE — Telephone Encounter (Signed)
Rx  for Fioricet didn't go through [facility]. Rx printed off, and Dr got gone before Rx was signed. I called in Rx to [facility] in [place], Fioricet 50-325 40mg  #30 0 refills ok per Dr. Despina Hidden. JSY

## 2013-09-19 ENCOUNTER — Telehealth: Payer: Self-pay | Admitting: *Deleted

## 2013-09-19 ENCOUNTER — Other Ambulatory Visit: Payer: Self-pay | Admitting: Obstetrics & Gynecology

## 2013-09-19 NOTE — Telephone Encounter (Signed)
Pt aware she has to come by office and pick up Fioricet Rx. JSY

## 2013-09-19 NOTE — Telephone Encounter (Signed)
Pt will have to come to office to pick up due to drug type

## 2013-10-01 ENCOUNTER — Ambulatory Visit (INDEPENDENT_AMBULATORY_CARE_PROVIDER_SITE_OTHER): Payer: BC Managed Care – PPO | Admitting: Obstetrics & Gynecology

## 2013-10-01 ENCOUNTER — Encounter (INDEPENDENT_AMBULATORY_CARE_PROVIDER_SITE_OTHER): Payer: Self-pay

## 2013-10-01 ENCOUNTER — Encounter: Payer: Self-pay | Admitting: Obstetrics & Gynecology

## 2013-10-01 VITALS — BP 128/80 | Ht 64.0 in | Wt 220.0 lb

## 2013-10-01 DIAGNOSIS — N760 Acute vaginitis: Secondary | ICD-10-CM

## 2013-10-01 MED ORDER — METRONIDAZOLE 0.75 % VA GEL: VAGINAL | Status: DC

## 2013-10-01 NOTE — Progress Notes (Signed)
Patient ID: [name], female   DOB: 03-26-1971, 42 y.o.   MRN: [id number] Pt with vaginal irritation symptoms x 2 weeks Used otc miconazole without symptom relief  Exam Moderate yellowish discharge no odor noted  Wet prep +BV no WBC no yeast no trichomonas  Metro Gel x 5 days Yearly in 2 weeks

## 2013-10-18 ENCOUNTER — Other Ambulatory Visit (HOSPITAL_COMMUNITY)
Admission: RE | Admit: 2013-10-18 | Discharge: 2013-10-18 | Disposition: A | Discharge status: Home / Self Care | Payer: BC Managed Care – PPO | Source: Ambulatory Visit | Attending: Obstetrics & Gynecology | Admitting: Obstetrics & Gynecology

## 2013-10-18 ENCOUNTER — Encounter: Payer: Self-pay | Admitting: Obstetrics & Gynecology

## 2013-10-18 ENCOUNTER — Ambulatory Visit (INDEPENDENT_AMBULATORY_CARE_PROVIDER_SITE_OTHER): Payer: BC Managed Care – PPO | Admitting: Obstetrics & Gynecology

## 2013-10-18 VITALS — BP 120/70 | Ht 65.0 in | Wt 222.0 lb

## 2013-10-18 DIAGNOSIS — Z01419 Encounter for gynecological examination (general) (routine) without abnormal findings: Secondary | ICD-10-CM

## 2013-10-18 MED ORDER — FLUCONAZOLE 150 MG PO TABS: 150.0000 mg | ORAL_TABLET | Freq: Once | ORAL | Status: DC

## 2013-10-18 NOTE — Progress Notes (Signed)
Patient ID: [name], female   DOB: 04-Jul-1971, 42 y.o.   MRN: [id number] Subjective:     [name] is a 42 y.o. female here for a routine exam.  No LMP recorded. Patient has had an ablation. No obstetric history on file. Current complaints: none,?yeast. .   Gynecologic History No LMP recorded. Patient has had an ablation. Contraception: tubal ligation Last Pap: 2013. Results were: normal Last mammogram: 2013 Results were: normal  Past Medical History  Diagnosis Date  . BV (bacterial vaginosis)     BV    Past Surgical History  Procedure Laterality Date  . Neck and chest lesion      OB History   Grav Para Term Preterm Abortions TAB SAB Ect Mult Living                  History   Social History  . Marital Status: Married    Spouse Name: N/A    Number of Children: N/A  . Years of Education: N/A   Social History Main Topics  . Smoking status: Former Games developer  . Smokeless tobacco: None  . Alcohol Use: None  . Drug Use: None  . Sexual Activity: None   Other Topics Concern  . None   Social History Narrative  . None    Family History  Problem Relation Age of Onset  . Breast cancer Mother   . Mental illness Mother   . Cancer Mother   . Hypertension Father   . Hyperlipidemia Father   . Diabetes Father      Review of Systems  Review of Systems  Constitutional: Negative for fever, chills, weight loss, malaise/fatigue and diaphoresis.  HENT: Negative for hearing loss, ear pain, nosebleeds, congestion, sore throat, neck pain, tinnitus and ear discharge.   Eyes: Negative for blurred vision, double vision, photophobia, pain, discharge and redness.  Respiratory: Negative for cough, hemoptysis, sputum production, shortness of breath, wheezing and stridor.   Cardiovascular: Negative for chest pain, palpitations, orthopnea, claudication, leg swelling and PND.  Gastrointestinal: negative for abdominal pain. Negative for heartburn, nausea, vomiting, diarrhea,  constipation, blood in stool and melena.  Genitourinary: Negative for dysuria, urgency, frequency, hematuria and flank pain.  Musculoskeletal: Negative for myalgias, back pain, joint pain and falls.  Skin: Negative for itching and rash.  Neurological: Negative for dizziness, tingling, tremors, sensory change, speech change, focal weakness, seizures, loss of consciousness, weakness and headaches.  Endo/Heme/Allergies: Negative for environmental allergies and polydipsia. Does not bruise/bleed easily.  Psychiatric/Behavioral: Negative for depression, suicidal ideas, hallucinations, memory loss and substance abuse. The patient is not nervous/anxious and does not have insomnia.        Objective:    Physical Exam  Vitals reviewed. Constitutional: She is oriented to person, place, and time. She appears well-developed and well-nourished.  HENT:  Head: Normocephalic and atraumatic.        Right Ear: External ear normal.  Left Ear: External ear normal.  Nose: Nose normal.  Mouth/Throat: Oropharynx is clear and moist.  Eyes: Conjunctivae and EOM are normal. Pupils are equal, round, and reactive to light. Right eye exhibits no discharge. Left eye exhibits no discharge. No scleral icterus.  Neck: Normal range of motion. Neck supple. No tracheal deviation present. No thyromegaly present.  Cardiovascular: Normal rate, regular rhythm, normal heart sounds and intact distal pulses.  Exam reveals no gallop and no friction rub.   No murmur heard. Respiratory: Effort normal and breath sounds normal. No respiratory distress. She  has no wheezes. She has no rales. She exhibits no tenderness.  GI: Soft. Bowel sounds are normal. She exhibits no distension and no mass. There is no tenderness. There is no rebound and no guarding.  Genitourinary:  Breasts no masses skin changes or nipple changes bilaterally      Vulva is normal without lesions Vagina is pink moist without discharge Cervix normal in appearance and  pap is done Uterus is normal size shape and contour Adnexa is negative with normal sized ovaries   Musculoskeletal: Normal range of motion. She exhibits no edema and no tenderness.  Neurological: She is alert and oriented to person, place, and time. She has normal reflexes. She displays normal reflexes. No cranial nerve deficit. She exhibits normal muscle tone. Coordination normal.  Skin: Skin is warm and dry. No rash noted. No erythema. No pallor.  Psychiatric: She has a normal mood and affect. Her behavior is normal. Judgment and thought content normal.       Assessment:    Healthy female exam.    Plan:    Mammogram ordered. Follow up in: 1 year.

## 2013-10-18 NOTE — Addendum Note (Signed)
Addended by: Richardson Chiquito on: 10/18/2013 12:31 PM   Modules accepted: Orders

## 2013-10-18 NOTE — Addendum Note (Signed)
Addended by: Lazaro Arms on: 10/18/2013 11:08 AM   Modules accepted: Orders

## 2013-10-19 LAB — TSH: TSH: 2.639 u[IU]/mL (ref 0.350–4.500)

## 2013-11-14 ENCOUNTER — Telehealth: Payer: Self-pay | Admitting: Obstetrics & Gynecology

## 2013-11-14 NOTE — Telephone Encounter (Signed)
Pt requesting RX for Fioricet. Pt states will pick up RX.

## 2013-11-15 MED ORDER — BUTALBITAL-APAP-CAFFEINE 50-325-40 MG PO TABS: ORAL_TABLET | ORAL | Status: DC

## 2013-11-19 NOTE — Telephone Encounter (Signed)
Pt informed RX for Fioricet left at front desk for pt to pick up.

## 2014-03-04 ENCOUNTER — Other Ambulatory Visit: Payer: Self-pay | Admitting: Obstetrics & Gynecology

## 2014-03-05 ENCOUNTER — Telehealth: Payer: Self-pay

## 2014-03-05 ENCOUNTER — Other Ambulatory Visit: Payer: Self-pay | Admitting: Obstetrics & Gynecology

## 2014-03-05 MED ORDER — BUTALBITAL-APAP-CAFFEINE 50-325-40 MG PO TABS: ORAL_TABLET | ORAL | Status: DC

## 2014-03-05 NOTE — Telephone Encounter (Signed)
Requesting refill on Fioricet.

## 2014-03-05 NOTE — Telephone Encounter (Signed)
I received no refill request prior to this, but have now refilled

## 2014-03-05 NOTE — Telephone Encounter (Signed)
Pt notified [place] faxed to pharmacy

## 2014-04-03 ENCOUNTER — Other Ambulatory Visit: Payer: Self-pay | Admitting: Obstetrics & Gynecology

## 2014-05-28 ENCOUNTER — Other Ambulatory Visit: Payer: Self-pay | Admitting: Obstetrics & Gynecology

## 2014-07-31 ENCOUNTER — Other Ambulatory Visit: Payer: Self-pay | Admitting: Obstetrics & Gynecology

## 2014-08-13 ENCOUNTER — Other Ambulatory Visit: Payer: Self-pay | Admitting: Obstetrics & Gynecology

## 2014-08-26 ENCOUNTER — Other Ambulatory Visit: Payer: Self-pay | Admitting: Obstetrics & Gynecology

## 2014-08-26 DIAGNOSIS — Z139 Encounter for screening, unspecified: Secondary | ICD-10-CM

## 2014-09-11 ENCOUNTER — Other Ambulatory Visit: Payer: Self-pay | Admitting: Obstetrics & Gynecology

## 2014-10-15 ENCOUNTER — Ambulatory Visit (HOSPITAL_COMMUNITY)
Admission: RE | Admit: 2014-10-15 | Discharge: 2014-10-15 | Disposition: A | Discharge status: Home / Self Care | Payer: BC Managed Care – PPO | Source: Ambulatory Visit | Attending: Obstetrics & Gynecology | Admitting: Obstetrics & Gynecology

## 2014-10-15 DIAGNOSIS — R928 Other abnormal and inconclusive findings on diagnostic imaging of breast: Secondary | ICD-10-CM | POA: Diagnosis not present

## 2014-10-15 DIAGNOSIS — Z1231 Encounter for screening mammogram for malignant neoplasm of breast: Secondary | ICD-10-CM | POA: Insufficient documentation

## 2014-10-15 DIAGNOSIS — Z139 Encounter for screening, unspecified: Secondary | ICD-10-CM

## 2014-10-17 ENCOUNTER — Other Ambulatory Visit: Payer: Self-pay | Admitting: Obstetrics & Gynecology

## 2014-10-17 DIAGNOSIS — R928 Other abnormal and inconclusive findings on diagnostic imaging of breast: Secondary | ICD-10-CM

## 2014-10-23 ENCOUNTER — Ambulatory Visit (INDEPENDENT_AMBULATORY_CARE_PROVIDER_SITE_OTHER): Payer: BC Managed Care – PPO | Admitting: Obstetrics & Gynecology

## 2014-10-23 ENCOUNTER — Other Ambulatory Visit (HOSPITAL_COMMUNITY)
Admission: RE | Admit: 2014-10-23 | Discharge: 2014-10-23 | Disposition: A | Discharge status: Home / Self Care | Payer: BC Managed Care – PPO | Source: Ambulatory Visit | Attending: Obstetrics & Gynecology | Admitting: Obstetrics & Gynecology

## 2014-10-23 ENCOUNTER — Encounter: Payer: Self-pay | Admitting: Obstetrics & Gynecology

## 2014-10-23 VITALS — BP 100/60 | Ht 64.2 in | Wt 196.0 lb

## 2014-10-23 DIAGNOSIS — Z01419 Encounter for gynecological examination (general) (routine) without abnormal findings: Secondary | ICD-10-CM | POA: Insufficient documentation

## 2014-10-23 MED ORDER — PROGESTERONE MICRONIZED 200 MG PO CAPS: ORAL_CAPSULE | ORAL | Status: DC

## 2014-10-23 MED ORDER — BUTALBITAL-APAP-CAFFEINE 50-325-40 MG PO TABS: ORAL_TABLET | ORAL | Status: DC

## 2014-10-23 NOTE — Progress Notes (Signed)
Patient ID: [name], female   DOB: July 16, 1971, 43 y.o.   MRN: [id number] Subjective:     [name] is a 43 y.o. female here for a routine exam.  No LMP recorded. Patient has had an ablation. No obstetric history on file. Birth Control Method:  BTL Menstrual Calendar(currently): 2-3 per year  Current complaints: none.   Current acute medical issues:  none   Recent Gynecologic History No LMP recorded. Patient has had an ablation. Last Pap: 2014,  normal Last mammogram: 2015,  Pending diagnostic  Past Medical History  Diagnosis Date  . BV (bacterial vaginosis)     BV    Past Surgical History  Procedure Laterality Date  . Neck and chest lesion      OB History    No data available      History   Social History  . Marital Status: Married    Spouse Name: N/A    Number of Children: N/A  . Years of Education: N/A   Social History Main Topics  . Smoking status: Former Research scientist (life sciences)  . Smokeless tobacco: None  . Alcohol Use: None  . Drug Use: None  . Sexual Activity: None   Other Topics Concern  . None   Social History Narrative    Family History  Problem Relation Age of Onset  . Breast cancer Mother   . Mental illness Mother   . Cancer Mother   . Hypertension Father   . Hyperlipidemia Father   . Diabetes Father     Current outpatient prescriptions: butalbital-acetaminophen-caffeine (FIORICET, ESGIC) 50-325-40 MG per tablet, TAKE 1 TO 2 TABLETS BY MOUTH EVERY 8 HOURS AS NEEDED FOR PAIN., Disp: 30 tablet, Rfl: 0;  hydrOXYzine (VISTARIL) 25 MG capsule, Take 25 mg by mouth 3 (three) times daily as needed for itching., Disp: , Rfl: ;  levothyroxine (SYNTHROID, LEVOTHROID) 300 MCG tablet, Take 300 mcg by mouth daily before breakfast., Disp: , Rfl:  fluconazole (DIFLUCAN) 150 MG tablet, Take 1 tablet (150 mg total) by mouth once. Take the second tablet 3 days after the first one., Disp: 2 tablet, Rfl: 0;  metroNIDAZOLE (METROGEL VAGINAL) 0.75 % vaginal gel, Nightly x 5  nights, Disp: 70 g, Rfl: 0  Review of Systems  Review of Systems  Constitutional: Negative for fever, chills, weight loss, malaise/fatigue and diaphoresis.  HENT: Negative for hearing loss, ear pain, nosebleeds, congestion, sore throat, neck pain, tinnitus and ear discharge.   Eyes: Negative for blurred vision, double vision, photophobia, pain, discharge and redness.  Respiratory: Negative for cough, hemoptysis, sputum production, shortness of breath, wheezing and stridor.   Cardiovascular: Negative for chest pain, palpitations, orthopnea, claudication, leg swelling and PND.  Gastrointestinal: negative for abdominal pain. Negative for heartburn, nausea, vomiting, diarrhea, constipation, blood in stool and melena.  Genitourinary: Negative for dysuria, urgency, frequency, hematuria and flank pain.  Musculoskeletal: Negative for myalgias, back pain, joint pain and falls.  Skin: Negative for itching and rash.  Neurological: Negative for dizziness, tingling, tremors, sensory change, speech change, focal weakness, seizures, loss of consciousness, weakness and headaches.  Endo/Heme/Allergies: Negative for environmental allergies and polydipsia. Does not bruise/bleed easily.  Psychiatric/Behavioral: Negative for depression, suicidal ideas, hallucinations, memory loss and substance abuse. The patient is not nervous/anxious and does not have insomnia.        Objective:  Blood pressure 100/60, height 5' 4.2" (1.631 m), weight 196 lb (88.905 kg).   Physical Exam  Vitals reviewed. Constitutional: She is oriented to person, place,  and time. She appears well-developed and well-nourished.  HENT:  Head: Normocephalic and atraumatic.        Right Ear: External ear normal.  Left Ear: External ear normal.  Nose: Nose normal.  Mouth/Throat: Oropharynx is clear and moist.  Eyes: Conjunctivae and EOM are normal. Pupils are equal, round, and reactive to light. Right eye exhibits no discharge. Left eye  exhibits no discharge. No scleral icterus.  Neck: Normal range of motion. Neck supple. No tracheal deviation present. No thyromegaly present.  Cardiovascular: Normal rate, regular rhythm, normal heart sounds and intact distal pulses.  Exam reveals no gallop and no friction rub.   No murmur heard. Respiratory: Effort normal and breath sounds normal. No respiratory distress. She has no wheezes. She has no rales. She exhibits no tenderness.  GI: Soft. Bowel sounds are normal. She exhibits no distension and no mass. There is no tenderness. There is no rebound and no guarding.  Genitourinary:  Breasts no masses skin changes or nipple changes bilaterally      Vulva is normal without lesions Vagina is pink moist without discharge Cervix normal in appearance and pap is done Uterus is normal size shape and contour Adnexa is negative with normal sized ovaries    Musculoskeletal: Normal range of motion. She exhibits no edema and no tenderness.  Neurological: She is alert and oriented to person, place, and time. She has normal reflexes. She displays normal reflexes. No cranial nerve deficit. She exhibits normal muscle tone. Coordination normal.  Skin: Skin is warm and dry. No rash noted. No erythema. No pallor.  Psychiatric: She has a normal mood and affect. Her behavior is normal. Judgment and thought content normal.       Assessment:    Healthy female exam.    Plan:    Follow up in: 1 year. prometrium 200 qhs to contro bleeding episodes

## 2014-10-27 LAB — CYTOLOGY - PAP

## 2014-11-11 ENCOUNTER — Ambulatory Visit (HOSPITAL_COMMUNITY)
Admission: RE | Admit: 2014-11-11 | Discharge: 2014-11-11 | Disposition: A | Discharge status: Home / Self Care | Payer: BC Managed Care – PPO | Source: Ambulatory Visit | Attending: Internal Medicine | Admitting: Internal Medicine

## 2014-11-11 ENCOUNTER — Ambulatory Visit (HOSPITAL_COMMUNITY)
Admission: RE | Admit: 2014-11-11 | Discharge: 2014-11-11 | Disposition: A | Discharge status: Home / Self Care | Payer: BC Managed Care – PPO | Source: Ambulatory Visit | Attending: Obstetrics & Gynecology | Admitting: Obstetrics & Gynecology

## 2014-11-11 ENCOUNTER — Other Ambulatory Visit (HOSPITAL_COMMUNITY): Payer: Self-pay | Admitting: Internal Medicine

## 2014-11-11 DIAGNOSIS — N6310 Unspecified lump in the right breast, unspecified quadrant: Secondary | ICD-10-CM

## 2014-11-11 DIAGNOSIS — R928 Other abnormal and inconclusive findings on diagnostic imaging of breast: Secondary | ICD-10-CM | POA: Insufficient documentation

## 2014-12-31 ENCOUNTER — Other Ambulatory Visit: Payer: Self-pay | Admitting: Obstetrics & Gynecology

## 2015-03-05 ENCOUNTER — Other Ambulatory Visit: Payer: Self-pay | Admitting: Obstetrics & Gynecology

## 2015-06-18 ENCOUNTER — Other Ambulatory Visit: Payer: Self-pay | Admitting: Obstetrics & Gynecology

## 2015-07-23 ENCOUNTER — Other Ambulatory Visit: Payer: Self-pay | Admitting: Obstetrics & Gynecology

## 2015-08-27 ENCOUNTER — Other Ambulatory Visit: Payer: Self-pay | Admitting: Obstetrics & Gynecology

## 2015-09-21 ENCOUNTER — Ambulatory Visit: Payer: Self-pay | Admitting: Obstetrics & Gynecology

## 2015-10-13 ENCOUNTER — Encounter (HOSPITAL_COMMUNITY): Payer: Self-pay

## 2015-10-26 ENCOUNTER — Other Ambulatory Visit: Payer: Self-pay | Admitting: Obstetrics & Gynecology

## 2015-11-13 ENCOUNTER — Other Ambulatory Visit: Payer: Self-pay | Admitting: Obstetrics & Gynecology

## 2015-11-20 ENCOUNTER — Other Ambulatory Visit (HOSPITAL_COMMUNITY)
Admission: RE | Admit: 2015-11-20 | Discharge: 2015-11-20 | Disposition: A | Discharge status: Home / Self Care | Payer: BLUE CROSS/BLUE SHIELD | Source: Ambulatory Visit | Attending: Obstetrics & Gynecology | Admitting: Obstetrics & Gynecology

## 2015-11-20 ENCOUNTER — Ambulatory Visit (INDEPENDENT_AMBULATORY_CARE_PROVIDER_SITE_OTHER): Payer: BLUE CROSS/BLUE SHIELD | Admitting: Obstetrics & Gynecology

## 2015-11-20 ENCOUNTER — Encounter: Payer: Self-pay | Admitting: Obstetrics & Gynecology

## 2015-11-20 VITALS — BP 140/90 | HR 74 | Ht 64.2 in | Wt 211.4 lb

## 2015-11-20 DIAGNOSIS — Z01419 Encounter for gynecological examination (general) (routine) without abnormal findings: Secondary | ICD-10-CM

## 2015-11-20 DIAGNOSIS — Z1151 Encounter for screening for human papillomavirus (HPV): Secondary | ICD-10-CM | POA: Insufficient documentation

## 2015-11-20 MED ORDER — LEVOTHYROXINE SODIUM 125 MCG PO TABS: ORAL_TABLET | ORAL | Status: DC

## 2015-11-20 MED ORDER — MEDROXYPROGESTERONE ACETATE 10 MG PO TABS: 10.0000 mg | ORAL_TABLET | Freq: Every day | ORAL | Status: DC

## 2015-11-20 NOTE — Progress Notes (Signed)
Patient ID: [name], female   DOB: Apr 26, 1971, 44 y.o.   MRN: [id number] Subjective:     [name] is a 44 y.o. female here for a routine exam.  No LMP recorded. Patient has had an ablation. No obstetric history on file. Birth Control Method:  Tubal ligation Menstrual Calendar(currently): none for 6 months  Current complaints: no gyn, some stress incontinence.   Current acute medical issues:  DM, hypothyroid   Recent Gynecologic History No LMP recorded. Patient has had an ablation. Last Pap: 2015,  normal Last mammogram: 2015,  abnormal  Past Medical History  Diagnosis Date  . BV (bacterial vaginosis)     BV  . Diabetes mellitus without complication [facility])     Past Surgical History  Procedure Laterality Date  . Neck and chest lesion    . Ablation    . Cesarean section    . Carpal tunnel release      OB History    No data available      Social History   Social History  . Marital Status: Married    Spouse Name: N/A  . Number of Children: N/A  . Years of Education: N/A   Social History Main Topics  . Smoking status: Former Research scientist (life sciences)  . Smokeless tobacco: None  . Alcohol Use: None  . Drug Use: None  . Sexual Activity: Not Asked   Other Topics Concern  . None   Social History Narrative    Family History  Problem Relation Age of Onset  . Breast cancer Mother   . Mental illness Mother   . Cancer Mother   . Hypertension Father   . Hyperlipidemia Father   . Diabetes Father      Current outpatient prescriptions:  .  butalbital-acetaminophen-caffeine (FIORICET, ESGIC) 50-325-40 MG tablet, TAKE 1 TO 2 TABLETS BY MOUTH EVERY 8 HOURS AS NEEDED FOR PAIN., Disp: 30 tablet, Rfl: 2 .  hydrOXYzine (VISTARIL) 25 MG capsule, Take 25 mg by mouth 3 (three) times daily as needed for itching., Disp: , Rfl:  .  levothyroxine (SYNTHROID, LEVOTHROID) 200 MCG tablet, Take 200 mcg by mouth daily before breakfast., Disp: , Rfl:  .  sitaGLIPtin (JANUVIA) 100 MG tablet,  Take 100 mg by mouth daily., Disp: , Rfl:  .  fluconazole (DIFLUCAN) 150 MG tablet, Take 1 tablet (150 mg total) by mouth once. Take the second tablet 3 days after the first one., Disp: 2 tablet, Rfl: 0  Review of Systems  Review of Systems  Constitutional: Negative for fever, chills, weight loss, malaise/fatigue and diaphoresis.  HENT: Negative for hearing loss, ear pain, nosebleeds, congestion, sore throat, neck pain, tinnitus and ear discharge.   Eyes: Negative for blurred vision, double vision, photophobia, pain, discharge and redness.  Respiratory: Negative for cough, hemoptysis, sputum production, shortness of breath, wheezing and stridor.   Cardiovascular: Negative for chest pain, palpitations, orthopnea, claudication, leg swelling and PND.  Gastrointestinal: negative for abdominal pain. Negative for heartburn, nausea, vomiting, diarrhea, constipation, blood in stool and melena.  Genitourinary: Negative for dysuria, urgency, frequency, hematuria and flank pain.  Musculoskeletal: Negative for myalgias, back pain, joint pain and falls.  Skin: Negative for itching and rash.  Neurological: Negative for dizziness, tingling, tremors, sensory change, speech change, focal weakness, seizures, loss of consciousness, weakness and headaches.  Endo/Heme/Allergies: Negative for environmental allergies and polydipsia. Does not bruise/bleed easily.  Psychiatric/Behavioral: Negative for depression, suicidal ideas, hallucinations, memory loss and substance abuse. The patient is not nervous/anxious and  does not have insomnia.        Objective:  Blood pressure 140/90, pulse 74, height 5' 4.2" (1.631 m), weight 211 lb 6.4 oz (95.89 kg).   Physical Exam  Vitals reviewed. Constitutional: She is oriented to person, place, and time. She appears well-developed and well-nourished.  HENT:  Head: Normocephalic and atraumatic.        Right Ear: External ear normal.  Left Ear: External ear normal.  Nose: Nose  normal.  Mouth/Throat: Oropharynx is clear and moist.  Eyes: Conjunctivae and EOM are normal. Pupils are equal, round, and reactive to light. Right eye exhibits no discharge. Left eye exhibits no discharge. No scleral icterus.  Neck: Normal range of motion. Neck supple. No tracheal deviation present. No thyromegaly present.  Cardiovascular: Normal rate, regular rhythm, normal heart sounds and intact distal pulses.  Exam reveals no gallop and no friction rub.   No murmur heard. Respiratory: Effort normal and breath sounds normal. No respiratory distress. She has no wheezes. She has no rales. She exhibits no tenderness.  GI: Soft. Bowel sounds are normal. She exhibits no distension and no mass. There is no tenderness. There is no rebound and no guarding.  Genitourinary:  Breasts no masses skin changes or nipple changes bilaterally      Vulva is normal without lesions Vagina is pink moist without discharge Cervix normal in appearance and pap is done Uterus is normal size shape and contour Adnexa is negative with normal sized ovaries   Musculoskeletal: Normal range of motion. She exhibits no edema and no tenderness.  Neurological: She is alert and oriented to person, place, and time. She has normal reflexes. She displays normal reflexes. No cranial nerve deficit. She exhibits normal muscle tone. Coordination normal.  Skin: Skin is warm and dry. No rash noted. No erythema. No pallor.  Psychiatric: She has a normal mood and affect. Her behavior is normal. Judgment and thought content normal.       Assessment:     normal GYN exam .    Plan:    Contraception: tubal ligation. Mammogram ordered. Follow up in: 1 year.    Provera 10 mg daily x 10 days every 3 months

## 2015-11-24 LAB — CYTOLOGY - PAP

## 2016-02-02 ENCOUNTER — Other Ambulatory Visit: Payer: Self-pay | Admitting: Obstetrics & Gynecology

## 2016-05-05 ENCOUNTER — Other Ambulatory Visit: Payer: Self-pay | Admitting: Obstetrics & Gynecology

## 2016-07-28 ENCOUNTER — Other Ambulatory Visit: Payer: Self-pay | Admitting: Obstetrics & Gynecology

## 2016-08-11 ENCOUNTER — Other Ambulatory Visit: Payer: Self-pay | Admitting: Obstetrics & Gynecology

## 2016-08-11 DIAGNOSIS — Z1231 Encounter for screening mammogram for malignant neoplasm of breast: Secondary | ICD-10-CM

## 2016-08-16 ENCOUNTER — Other Ambulatory Visit: Payer: Self-pay | Admitting: Obstetrics & Gynecology

## 2016-08-30 ENCOUNTER — Ambulatory Visit
Admission: RE | Admit: 2016-08-30 | Discharge: 2016-08-30 | Disposition: A | Discharge status: Home / Self Care | Payer: Managed Care, Other (non HMO) | Source: Ambulatory Visit | Attending: Obstetrics & Gynecology | Admitting: Obstetrics & Gynecology

## 2016-08-30 DIAGNOSIS — Z1231 Encounter for screening mammogram for malignant neoplasm of breast: Secondary | ICD-10-CM

## 2016-09-20 ENCOUNTER — Other Ambulatory Visit: Payer: Self-pay | Admitting: Obstetrics & Gynecology

## 2016-10-07 ENCOUNTER — Other Ambulatory Visit: Payer: Self-pay | Admitting: Obstetrics & Gynecology

## 2016-11-04 ENCOUNTER — Other Ambulatory Visit: Payer: Self-pay | Admitting: Obstetrics & Gynecology

## 2016-11-24 ENCOUNTER — Other Ambulatory Visit: Payer: Self-pay | Admitting: Obstetrics & Gynecology

## 2016-11-25 ENCOUNTER — Ambulatory Visit: Payer: BLUE CROSS/BLUE SHIELD | Admitting: Obstetrics & Gynecology

## 2016-11-25 ENCOUNTER — Other Ambulatory Visit: Payer: BLUE CROSS/BLUE SHIELD | Admitting: Obstetrics & Gynecology

## 2016-11-30 ENCOUNTER — Telehealth: Payer: Self-pay | Admitting: Obstetrics & Gynecology

## 2016-11-30 NOTE — Telephone Encounter (Signed)
Patient would like refill on generic Fioricet. Please advise.

## 2016-12-01 ENCOUNTER — Other Ambulatory Visit: Payer: Self-pay | Admitting: Obstetrics & Gynecology

## 2016-12-01 MED ORDER — BUTALBITAL-APAP-CAFFEINE 50-325-40 MG PO TABS: 1.0000 | ORAL_TABLET | Freq: Three times a day (TID) | ORAL | 0 refills | Status: DC | PRN

## 2016-12-01 NOTE — Telephone Encounter (Signed)
Left message on VM that prescription was refilled and sent to pharmacy.

## 2016-12-23 ENCOUNTER — Telehealth: Payer: Self-pay | Admitting: Obstetrics & Gynecology

## 2016-12-23 NOTE — Telephone Encounter (Signed)
Pt would like a call back from the nurse regarding a test and to see if we could pre authorize this test. Please contact pt

## 2016-12-23 NOTE — Telephone Encounter (Signed)
Spoke with pt. Pt wonders if Dr. Elonda Husky will order BRCA test. Pt has family history of breast cancer. She is scheduled to see Dr. Elonda Husky 01/02/17. Pt states insurance requires a prior auth for this test. Please advise. Thanks!! [place]

## 2016-12-26 ENCOUNTER — Other Ambulatory Visit: Payer: Self-pay | Admitting: Obstetrics & Gynecology

## 2016-12-26 NOTE — Telephone Encounter (Signed)
We can get it done at her scheduled appt

## 2016-12-26 NOTE — Telephone Encounter (Signed)
Pt needs to make an appointment before another refill is given, I will refill this time but needs to be seen prior to her next refill

## 2016-12-26 NOTE — Telephone Encounter (Signed)
Informed patient Fioricet was sent to pharmacy.

## 2016-12-27 ENCOUNTER — Telehealth: Payer: Self-pay | Admitting: *Deleted

## 2016-12-27 NOTE — Telephone Encounter (Signed)
Informed patient that note was sent to Amy at the [facility] to schedule an appointment. I will contact patient if further questions arise.

## 2016-12-28 ENCOUNTER — Encounter: Payer: Self-pay | Admitting: Genetic Counselor

## 2016-12-28 ENCOUNTER — Telehealth: Payer: Self-pay | Admitting: *Deleted

## 2016-12-28 NOTE — Telephone Encounter (Signed)
Called patient to inform her of information from Amy at the cancer screening. Patient states she wants to proceed with the genetic process. I sent Amy Nance a message letting her know to contact patient.

## 2017-01-02 ENCOUNTER — Ambulatory Visit (INDEPENDENT_AMBULATORY_CARE_PROVIDER_SITE_OTHER): Payer: Managed Care, Other (non HMO) | Admitting: Obstetrics & Gynecology

## 2017-01-02 ENCOUNTER — Other Ambulatory Visit (HOSPITAL_COMMUNITY)
Admission: RE | Admit: 2017-01-02 | Discharge: 2017-01-02 | Disposition: A | Discharge status: Home / Self Care | Payer: Managed Care, Other (non HMO) | Source: Ambulatory Visit | Attending: Obstetrics & Gynecology | Admitting: Obstetrics & Gynecology

## 2017-01-02 ENCOUNTER — Encounter: Payer: Self-pay | Admitting: Obstetrics & Gynecology

## 2017-01-02 VITALS — BP 140/80 | HR 72 | Wt 207.0 lb

## 2017-01-02 DIAGNOSIS — Z1151 Encounter for screening for human papillomavirus (HPV): Secondary | ICD-10-CM | POA: Insufficient documentation

## 2017-01-02 DIAGNOSIS — Z01419 Encounter for gynecological examination (general) (routine) without abnormal findings: Secondary | ICD-10-CM | POA: Diagnosis present

## 2017-01-02 MED ORDER — TOPIRAMATE 25 MG PO TABS: 25.0000 mg | ORAL_TABLET | Freq: Two times a day (BID) | ORAL | 11 refills | Status: DC

## 2017-01-02 NOTE — Progress Notes (Signed)
Subjective:     [name] is a 46 y.o. female here for a routine exam.  No LMP recorded. Patient has had an ablation. No obstetric history on file. Birth Control Method:  Tubal ligation and endometrial ablation Menstrual Calendar(currently): amenorrheic  Current complaints: continued unilateral headaches now, not responding to fioricet.   Current acute medical issues:  diabetes   Recent Gynecologic History No LMP recorded. Patient has had an ablation. Last Pap: 2017,  normal Last mammogram: 2017,  normal  Past Medical History:  Diagnosis Date  . BV (bacterial vaginosis)    BV  . Diabetes mellitus without complication ([place])   . IBS (irritable bowel syndrome)     Past Surgical History:  Procedure Laterality Date  . ABLATION    . CARPAL TUNNEL RELEASE    . CESAREAN SECTION    . NECK AND CHEST LESION      OB History    No data available      Social History   Social History  . Marital status: Married    Spouse name: N/A  . Number of children: N/A  . Years of education: N/A   Social History Main Topics  . Smoking status: Former Research scientist (life sciences)  . Smokeless tobacco: Former Systems developer  . Alcohol use None  . Drug use: Unknown  . Sexual activity: Not Asked   Other Topics Concern  . None   Social History Narrative  . None    Family History  Problem Relation Age of Onset  . Breast cancer Mother   . Mental illness Mother   . Cancer Mother   . Hypertension Father   . Hyperlipidemia Father   . Diabetes Father      Current Outpatient Prescriptions:  .  butalbital-acetaminophen-caffeine (FIORICET, ESGIC) 50-325-40 MG tablet, TAKE 1 TO 2 TABLETS BY MOUTH EVERY 8 HOURS AS NEEDED FOR PAIN., Disp: 30 tablet, Rfl: 0 .  canagliflozin (INVOKANA) 100 MG TABS tablet, Take by mouth daily before breakfast., Disp: , Rfl:  .  fenofibrate micronized (LOFIBRA) 200 MG capsule, Take 200 mg by mouth daily before breakfast., Disp: , Rfl:  .  hydrOXYzine (VISTARIL) 25 MG capsule, Take 25 mg  by mouth 3 (three) times daily as needed for itching., Disp: , Rfl:  .  levothyroxine (SYNTHROID, LEVOTHROID) 125 MCG tablet, Take 2 before breakfast, Disp: 60 tablet, Rfl: 11 .  medroxyPROGESTERone (PROVERA) 10 MG tablet, Take 1 tablet (10 mg total) by mouth daily., Disp: 30 tablet, Rfl: 1  Review of Systems  Review of Systems  Constitutional: Negative for fever, chills, weight loss, malaise/fatigue and diaphoresis.  HENT: Negative for hearing loss, ear pain, nosebleeds, congestion, sore throat, neck pain, tinnitus and ear discharge.   Eyes: Negative for blurred vision, double vision, photophobia, pain, discharge and redness.  Respiratory: Negative for cough, hemoptysis, sputum production, shortness of breath, wheezing and stridor.   Cardiovascular: Negative for chest pain, palpitations, orthopnea, claudication, leg swelling and PND.  Gastrointestinal: negative for abdominal pain. Negative for heartburn, nausea, vomiting, diarrhea, constipation, blood in stool and melena.  Genitourinary: Negative for dysuria, urgency, frequency, hematuria and flank pain.  Musculoskeletal: Negative for myalgias, back pain, joint pain and falls.  Skin: Negative for itching and rash.  Neurological: Negative for dizziness, tingling, tremors, sensory change, speech change, focal weakness, seizures, loss of consciousness, weakness and headaches.  Endo/Heme/Allergies: Negative for environmental allergies and polydipsia. Does not bruise/bleed easily.  Psychiatric/Behavioral: Negative for depression, suicidal ideas, hallucinations, memory loss and substance abuse. The patient  is not nervous/anxious and does not have insomnia.        Objective:  Blood pressure 140/80, pulse 72, weight 207 lb (93.9 kg).   Physical Exam  Vitals reviewed. Constitutional: She is oriented to person, place, and time. She appears well-developed and well-nourished.  HENT:  Head: Normocephalic and atraumatic.        Right Ear: External  ear normal.  Left Ear: External ear normal.  Nose: Nose normal.  Mouth/Throat: Oropharynx is clear and moist.  Eyes: Conjunctivae and EOM are normal. Pupils are equal, round, and reactive to light. Right eye exhibits no discharge. Left eye exhibits no discharge. No scleral icterus.  Neck: Normal range of motion. Neck supple. No tracheal deviation present. No thyromegaly present.  Cardiovascular: Normal rate, regular rhythm, normal heart sounds and intact distal pulses.  Exam reveals no gallop and no friction rub.   No murmur heard. Respiratory: Effort normal and breath sounds normal. No respiratory distress. She has no wheezes. She has no rales. She exhibits no tenderness.  GI: Soft. Bowel sounds are normal. She exhibits no distension and no mass. There is no tenderness. There is no rebound and no guarding.  Genitourinary:  Breasts no masses skin changes or nipple changes bilaterally      Vulva is normal without lesions Vagina is pink moist without discharge Cervix normal in appearance and pap is done Uterus is normal size shape and contour Adnexa is negative with normal sized ovaries   Musculoskeletal: Normal range of motion. She exhibits no edema and no tenderness.  Neurological: She is alert and oriented to person, place, and time. She has normal reflexes. She displays normal reflexes. No cranial nerve deficit. She exhibits normal muscle tone. Coordination normal.  Skin: Skin is warm and dry. No rash noted. No erythema. No pallor.  Psychiatric: She has a normal mood and affect. Her behavior is normal. Judgment and thought content normal.       Medications Ordered at today's visit: Meds ordered this encounter  Medications  . canagliflozin (INVOKANA) 100 MG TABS tablet    Sig: Take by mouth daily before breakfast.  . fenofibrate micronized (LOFIBRA) 200 MG capsule    Sig: Take 200 mg by mouth daily before breakfast.    Other orders placed at today's visit: No orders of the defined  types were placed in this encounter.     Assessment:    Healthy female exam.    Plan:    Mammogram ordered. Follow up in: 3 weeks. check on response to topamax     No Follow-up on file.

## 2017-01-05 LAB — CYTOLOGY - PAP
Diagnosis: NEGATIVE
HPV: NOT DETECTED

## 2017-01-23 ENCOUNTER — Ambulatory Visit: Payer: Managed Care, Other (non HMO) | Admitting: Obstetrics & Gynecology

## 2017-01-26 ENCOUNTER — Ambulatory Visit (INDEPENDENT_AMBULATORY_CARE_PROVIDER_SITE_OTHER): Payer: Managed Care, Other (non HMO) | Admitting: Obstetrics & Gynecology

## 2017-01-26 ENCOUNTER — Encounter: Payer: Self-pay | Admitting: Obstetrics & Gynecology

## 2017-01-26 VITALS — BP 130/80 | HR 78 | Temp 98.6°F | Wt 205.4 lb

## 2017-01-26 DIAGNOSIS — R519 Headache, unspecified: Secondary | ICD-10-CM

## 2017-01-26 DIAGNOSIS — H6501 Acute serous otitis media, right ear: Secondary | ICD-10-CM | POA: Diagnosis not present

## 2017-01-26 DIAGNOSIS — R51 Headache: Secondary | ICD-10-CM | POA: Diagnosis not present

## 2017-01-26 MED ORDER — AMOXICILLIN-POT CLAVULANATE 875-125 MG PO TABS: 1.0000 | ORAL_TABLET | Freq: Two times a day (BID) | ORAL | 0 refills | Status: DC

## 2017-01-26 NOTE — Progress Notes (Signed)
Chief Complaint  Patient presents with  . Follow-up    c/o loe grade fever/ sore throat and pressure behind lt ear. room# 10    Blood pressure 130/80, pulse 78, temperature 98.6 F (37 C), weight 205 lb 6.4 oz (93.2 kg).  46 y.o. No obstetric history on file. No LMP recorded. Patient has had an ablation. The current method of family planning is tubal ligation.  Outpatient Encounter Prescriptions as of 01/26/2017  Medication Sig  . canagliflozin (INVOKANA) 100 MG TABS tablet Take by mouth daily before breakfast.  . fenofibrate micronized (LOFIBRA) 200 MG capsule Take 200 mg by mouth daily before breakfast.  . hydrOXYzine (VISTARIL) 25 MG capsule Take 25 mg by mouth 3 (three) times daily as needed for itching.  . levothyroxine (SYNTHROID, LEVOTHROID) 125 MCG tablet Take 2 before breakfast  . medroxyPROGESTERone (PROVERA) 10 MG tablet Take 1 tablet (10 mg total) by mouth daily.  Marland Kitchen topiramate (TOPAMAX) 25 MG tablet Take 1 tablet (25 mg total) by mouth 2 (two) times daily.  Marland Kitchen amoxicillin-clavulanate (AUGMENTIN) 875-125 MG tablet Take 1 tablet by mouth 2 (two) times daily.  . [DISCONTINUED] butalbital-acetaminophen-caffeine (FIORICET, ESGIC) 50-325-40 MG tablet TAKE 1 TO 2 TABLETS BY MOUTH EVERY 8 HOURS AS NEEDED FOR PAIN.   No facility-administered encounter medications on file as of 01/26/2017.     Subjective Pt states she is having significant right ear pain congestion some cough and low grade fevers, coughing is definitely not a dominant part of her symptoms   Additionally pt is 50% better with her headaches Objective Lungs clear Throat clear Right TM is injected and bulging  Left TM is clear Bilateral lymphadenopathy  Pertinent ROS No burning with urination, frequency or urgency No nausea, vomiting or diarrhea Nor fever chills or other constitutional symptoms   Labs or studies     Impression Diagnoses this Encounter::   ICD-9-CM ICD-10-CM   1. Right acute  serous otitis media, recurrence not specified 381.01 H65.01   2. Headache disorder 784.0 R51     Established relevant diagnosis(es):   Plan/Recommendations: Meds ordered this encounter  Medications  . amoxicillin-clavulanate (AUGMENTIN) 875-125 MG tablet    Sig: Take 1 tablet by mouth 2 (two) times daily.    Dispense:  20 tablet    Refill:  0    Labs or Scans Ordered: No orders of the defined types were placed in this encounter.   Management:: Will [facility] has decent response to topamax augmentin for 10 days for OM  Follow up Return if symptoms worsen or fail to improve.        All questions were answered.  Past Medical History:  Diagnosis Date  . BV (bacterial vaginosis)    BV  . Diabetes mellitus without complication ([place])   . IBS (irritable bowel syndrome)     Past Surgical History:  Procedure Laterality Date  . ABLATION    . CARPAL TUNNEL RELEASE    . CESAREAN SECTION    . NECK AND CHEST LESION      OB History    No data available      Allergies  Allergen Reactions  . Asa [Aspirin]     Up set stomach  . Sulfur Hives    Social History   Social History  . Marital status: Married    Spouse name: N/A  . Number of children: N/A  . Years of education: N/A   Social History Main Topics  .  Smoking status: Former Research scientist (life sciences)  . Smokeless tobacco: Former Systems developer  . Alcohol use None  . Drug use: Unknown  . Sexual activity: Not Asked   Other Topics Concern  . None   Social History Narrative  . None    Family History  Problem Relation Age of Onset  . Breast cancer Mother   . Mental illness Mother   . Cancer Mother   . Hypertension Father   . Hyperlipidemia Father   . Diabetes Father

## 2017-02-03 ENCOUNTER — Other Ambulatory Visit: Payer: Self-pay | Admitting: *Deleted

## 2017-02-03 MED ORDER — TOPIRAMATE 25 MG PO TABS: 25.0000 mg | ORAL_TABLET | Freq: Two times a day (BID) | ORAL | 4 refills | Status: DC

## 2017-02-03 NOTE — Telephone Encounter (Signed)
Needs 90 day supply per insurance

## 2017-02-06 ENCOUNTER — Encounter: Payer: Self-pay | Admitting: Genetic Counselor

## 2017-02-09 ENCOUNTER — Encounter (HOSPITAL_COMMUNITY): Payer: Managed Care, Other (non HMO)

## 2017-02-09 ENCOUNTER — Encounter (HOSPITAL_COMMUNITY): Payer: Managed Care, Other (non HMO) | Attending: Genetic Counselor | Admitting: Genetic Counselor

## 2017-02-09 ENCOUNTER — Encounter (HOSPITAL_COMMUNITY): Payer: Self-pay | Admitting: Genetic Counselor

## 2017-02-09 DIAGNOSIS — Z803 Family history of malignant neoplasm of breast: Secondary | ICD-10-CM | POA: Diagnosis not present

## 2017-02-09 DIAGNOSIS — Z85850 Personal history of malignant neoplasm of thyroid: Secondary | ICD-10-CM

## 2017-02-09 DIAGNOSIS — Z801 Family history of malignant neoplasm of trachea, bronchus and lung: Secondary | ICD-10-CM

## 2017-02-09 DIAGNOSIS — Z315 Encounter for genetic counseling: Secondary | ICD-10-CM

## 2017-02-09 DIAGNOSIS — C801 Malignant (primary) neoplasm, unspecified: Secondary | ICD-10-CM

## 2017-02-09 NOTE — Progress Notes (Signed)
REFERRING PROVIDER: Redmond School, MD [place], [place] [address]   Tania Ade, MD  PRIMARY PROVIDER:  Glo Herring., MD  PRIMARY REASON FOR VISIT:  1. Cancer ([place])   2. Family history of breast cancer      HISTORY OF PRESENT ILLNESS:   [name], a 46 y.o. female, was seen for a [facility] cancer genetics consultation at the request of Dr. Elonda Husky due to a personal and family history of cancer.  [name] presents to clinic today to discuss the possibility of a hereditary predisposition to cancer, genetic testing, and to further clarify her future cancer risks, as well as potential cancer risks for family members.   In 2009, at the age of 36, [name] was diagnosed with Papillary thyroid cancer.  She has had mammograms that have revealed dense breast tissue, category C.  Based on this and her maternal family history of breast cancer, [name] is interested in pursuing genetic testing.     CANCER HISTORY:   No history exists.     HORMONAL RISK FACTORS:  Menarche was at age 66.  First live birth at age 41.  OCP use for approximately 3-4 years.  Ovaries intact: yes.  Hysterectomy: no.  Menopausal status: premenopausal.  HRT use: 0 years. Colonoscopy: no; not examined. Mammogram within the last year: yes. Number of breast biopsies: 0. Up to date with pelvic exams:  yes. Any excessive radiation exposure in the past:  no  Past Medical History:  Diagnosis Date  . BV (bacterial vaginosis)    BV  . Cancer [facility]) 2009   papillary thyroid cancer  . Diabetes mellitus without complication ([place])   . Family history of breast cancer   . IBS (irritable bowel syndrome)     Past Surgical History:  Procedure Laterality Date  . ABLATION    . CARPAL TUNNEL RELEASE    . CESAREAN SECTION    . NECK AND CHEST LESION      Social History   Social History  . Marital status: Married    Spouse name: N/A  . Number of children: N/A  . Years of education: N/A    Social History Main Topics  . Smoking status: Former Research scientist (life sciences)  . Smokeless tobacco: Former Systems developer  . Alcohol use None  . Drug use: Unknown  . Sexual activity: Not Asked   Other Topics Concern  . None   Social History Narrative  . None     FAMILY HISTORY:  We obtained a detailed, 4-generation family history.  Significant diagnoses are listed below: Family History  Problem Relation Age of Onset  . Breast cancer Mother     died at age 43  . Mental illness Mother   . Hypertension Father   . Hyperlipidemia Father   . Diabetes Father   . Breast cancer Maternal Grandmother     died in her late 51s  . Lung cancer Maternal Uncle     The patient has two children who are cancer free.  She has a paternal half brother who is healthy and cancer free.  Her father is alive and has never had cancer.  He has three brothers and a sister who do not have cancer. One brother has a son who had leukemia at age 21. There is no reported family history of cancer on the paternal side.    The patient was estranged from her mother as a child.  She knows that her mother had breast cancer diagnosed in  her early 14's and died of unrelated causes at age 58.  She had two brothers, one who died of a heart attack and the other who was a smoker and now has lung cancer.  Her maternal grandmother was diagnosed with breast cancer at 54 and died of CHF at 67.  There is no other known family history of cancer.  [name] is unaware of previous family history of genetic testing for hereditary cancer risks. Patient's maternal ancestors are of Caucasian descent, and paternal ancestors are of Caucasian descent. There is no reported Ashkenazi Jewish ancestry. There is no known consanguinity.  GENETIC COUNSELING ASSESSMENT: [name] is a 46 y.o. female with a personal and family history of cancer which is somewhat suggestive of a hereditary cancer syndrome and predisposition to cancer. We, therefore, discussed and recommended  the following at today's visit.   DISCUSSION: We reviewed thyroid cancer, and that about 80% of cases of thyroid cancer are papillary, about 36-46% are follicular and the remaining cases are due to more rare forms of thyroid cancer.  Medullary thyroid cancer and follicular thyroid cancer are most commonly associated with hereditary cancer syndromes.  The vast majority of papillary thyroid cancer are sporadic and not associated with hereditary cancer syndromes.  We disucssed that about 5-10% of breast cancer is hereditary with most cases due to BRCA mutations. Other genes associated with hereditary breast cancer syndromes include ATM, CHEK2 and PALB2. We reviewed the characteristics, features and inheritance patterns of hereditary cancer syndromes. We also discussed genetic testing, including the appropriate family members to test, the process of testing, insurance coverage and turn-around-time for results. We discussed the implications of a negative, positive and/or variant of uncertain significant result. We recommended [name] pursue genetic testing for the Common Hereditary cancer gene panel. The Hereditary Gene Panel offered by [facility] includes sequencing and/or deletion duplication testing of the following 43 genes: APC, ATM, AXIN2, BARD1, BMPR1A, BRCA1, BRCA2, BRIP1, CDH1, CDKN2A (p14ARF), CDKN2A (p16INK4a), CHEK2, DICER1, EPCAM (Deletion/duplication testing only), GREM1 (promoter region deletion/duplication testing only), KIT, MEN1, MLH1, MSH2, MSH6, MUTYH, NBN, NF1, PALB2, PDGFRA, PMS2, POLD1, POLE, PTEN, RAD50, RAD51C, RAD51D, SDHB, SDHC, SDHD, SMAD4, SMARCA4. STK11, TP53, TSC1, TSC2, and VHL.  The following gene was evaluated for sequence changes only: SDHA and HOXB13 c.251G>A variant only.   Based on [name]'s personal and family history of cancer, she meets medical criteria for genetic testing. Despite that she meets criteria, she may still have an out of pocket cost. We discussed that if her  out of pocket cost for testing is over $100, the laboratory will call and confirm whether she wants to proceed with testing.  If the out of pocket cost of testing is less than $100 she will be billed by the genetic testing laboratory.   In order to estimate her chance of having a BRCA mutation, we used statistical models (BRACAPro, [employer] and [employer]) and laboratory data that take into account her personal medical history, family history and ancestry.  Because each model is different, there can be a lot of variability in the risks they give.  Therefore, these numbers must be considered a rough range and not a precise risk of having a BRCA mutation.  These models estimate that she has approximately a 1.48-7% chance of having a mutation.   Based on the patient's personal and family history, statistical models (Tyrer Cusik and [place])  and literature data were used to estimate her risk of developing breast cancer.  These estimate her lifetime risk of developing breast cancer to be approximately 10% to 38.8%. This estimation does not take into account any genetic testing results.  The patient's lifetime breast cancer risk is a preliminary estimate based on available information using one of several models endorsed by the [place] (ACS). The ACS recommends consideration of breast MRI screening as an adjunct to mammography for patients at high risk (defined as 20% or greater lifetime risk). A more detailed breast cancer risk assessment can be considered, if clinically indicated.   [name] has been determined to be at high risk for breast cancer.  Therefore, we recommend that annual screening with mammography and breast MRI begin at age 3, or 10 years prior to the age of breast cancer diagnosis in a relative (whichever is earlier).  We discussed that [name] should discuss her individual situation with her referring physician and determine a breast cancer screening plan with which they are  both comfortable.    PLAN: After considering the risks, benefits, and limitations, [name]  provided informed consent to pursue genetic testing and the blood sample was sent to [facility] for analysis of the Common Hereditary cancer panel. Results should be available within approximately 2-3 weeks' time, at which point they will be disclosed by telephone to [name], as will any additional recommendations warranted by these results. [name] will receive a summary of her genetic counseling visit and a copy of her results once available. This information will also be available in Epic. We encouraged [name] to remain in contact with cancer genetics annually so that we can continuously update the family history and inform her of any changes in cancer genetics and testing that may be of benefit for her family. [name] questions were answered to her satisfaction today. Our contact information was provided should additional questions or concerns arise.  Lastly, we encouraged [name] to remain in contact with cancer genetics annually so that we can continuously update the family history and inform her of any changes in cancer genetics and testing that may be of benefit for this family.   Ms.  [name] questions were answered to her satisfaction today. Our contact information was provided should additional questions or concerns arise. Thank you for the referral and allowing [place] to share in the care of your patient.   Diar Berkel P. Florene Glen, [place], [facility] Certified Genetic Counselor [email or web address] phone: [phone]  The patient was seen for a total of 60 minutes in face-to-face genetic counseling.  This patient was discussed with Drs. Magrinat, Lindi Adie and/or Burr Medico who agrees with the above.    _______________________________________________________________________ For Office Staff:  Number of people involved in session: 1 Was an Intern/ student involved with case: no

## 2017-02-21 ENCOUNTER — Encounter: Payer: Self-pay | Admitting: Genetic Counselor

## 2017-02-21 ENCOUNTER — Telehealth: Payer: Self-pay | Admitting: Genetic Counselor

## 2017-02-21 DIAGNOSIS — Z1379 Encounter for other screening for genetic and chromosomal anomalies: Secondary | ICD-10-CM | POA: Insufficient documentation

## 2017-02-21 NOTE — Telephone Encounter (Signed)
Revealed negative genetic testing.  Discussed that we do not know why she has a family history of breast cancer. It could be due to a different gene that we are not testing, or maybe our current technology may not be able to pick something up.  It will be important for her to keep in contact with genetics to keep up with whether additional testing may be needed.

## 2017-02-22 ENCOUNTER — Ambulatory Visit: Payer: Self-pay | Admitting: Genetic Counselor

## 2017-02-22 DIAGNOSIS — Z1379 Encounter for other screening for genetic and chromosomal anomalies: Secondary | ICD-10-CM

## 2017-02-22 DIAGNOSIS — Z803 Family history of malignant neoplasm of breast: Secondary | ICD-10-CM

## 2017-02-22 NOTE — Progress Notes (Signed)
HPI: Ms. Magner was previously seen in the [place] clinic due to a personal and family history of cancer and concerns regarding a hereditary predisposition to cancer. Please refer to our prior cancer genetics clinic note for more information regarding Ms. Rathel's medical, social and family histories, and our assessment and recommendations, at the time. Ms. Decoursey recent genetic test results were disclosed to her, as were recommendations warranted by these results. These results and recommendations are discussed in more detail below.  CANCER HISTORY:    Cancer ([place])   12/06/2007 Initial Diagnosis    Cancer ([place])     02/18/2017 Genetic Testing    Negative genetic testing on the Common Hereditary Cancer panel.  The Hereditary Gene Panel offered by [facility] includes sequencing and/or deletion duplication testing of the following 43 genes: APC, ATM, AXIN2, BARD1, BMPR1A, BRCA1, BRCA2, BRIP1, CDH1, CDKN2A (p14ARF), CDKN2A (p16INK4a), CHEK2, DICER1, EPCAM (Deletion/duplication testing only), GREM1 (promoter region deletion/duplication testing only), KIT, MEN1, MLH1, MSH2, MSH6, MUTYH, NBN, NF1, PALB2, PDGFRA, PMS2, POLD1, POLE, PTEN, RAD50, RAD51C, RAD51D, SDHB, SDHC, SDHD, SMAD4, SMARCA4. STK11, TP53, TSC1, TSC2, and VHL.  The following gene was evaluated for sequence changes only: SDHA and HOXB13 c.251G>A variant only. The report date is February 18, 2017.        FAMILY HISTORY:  We obtained a detailed, 4-generation family history.  Significant diagnoses are listed below: Family History  Problem Relation Age of Onset  . Breast cancer Mother     died at age 68  . Mental illness Mother   . Hypertension Father   . Hyperlipidemia Father   . Diabetes Father   . Breast cancer Maternal Grandmother     died in her late 66s  . Lung cancer Maternal Uncle     The patient has two children who are cancer free.  She has a paternal half brother who is healthy and cancer free.  Her father is  alive and has never had cancer.  He has three brothers and a sister who do not have cancer. One brother has a son who had leukemia at age 79. There is no reported family history of cancer on the paternal side.    The patient was estranged from her mother as a child.  She knows that her mother had breast cancer diagnosed in her early 84's and died of unrelated causes at age 59.  She had two brothers, one who died of a heart attack and the other who was a smoker and now has lung cancer.  Her maternal grandmother was diagnosed with breast cancer at 38 and died of CHF at 66.  There is no other known family history of cancer.  Ms. Candy is unaware of previous family history of genetic testing for hereditary cancer risks. Patient's maternal ancestors are of Caucasian descent, and paternal ancestors are of Caucasian descent. There is no reported Ashkenazi Jewish ancestry. There is no known consanguinity.  GENETIC TEST RESULTS: Genetic testing reported out on February 18, 2017 through the Common Hereditary cancer panel found no deleterious mutations.  The Hereditary Gene Panel offered by [facility] includes sequencing and/or deletion duplication testing of the following 43 genes: APC, ATM, AXIN2, BARD1, BMPR1A, BRCA1, BRCA2, BRIP1, CDH1, CDKN2A (p14ARF), CDKN2A (p16INK4a), CHEK2, DICER1, EPCAM (Deletion/duplication testing only), GREM1 (promoter region deletion/duplication testing only), KIT, MEN1, MLH1, MSH2, MSH6, MUTYH, NBN, NF1, PALB2, PDGFRA, PMS2, POLD1, POLE, PTEN, RAD50, RAD51C, RAD51D, SDHB, SDHC, SDHD, SMAD4, SMARCA4. STK11, TP53, TSC1, TSC2, and VHL.  The  following gene was evaluated for sequence changes only: SDHA and HOXB13 c.251G>A variant only.  The test report has been scanned into EPIC and is located under the Molecular Pathology section of the Results Review tab.   We discussed with Ms. Burgin that since the current genetic testing is not perfect, it is possible there may be a gene mutation in one of  these genes that current testing cannot detect, but that chance is small. We also discussed, that it is possible that another gene that has not yet been discovered, or that we have not yet tested, is responsible for the cancer diagnoses in the family, and it is, therefore, important to remain in touch with cancer genetics in the future so that we can continue to offer Ms. Balik the most up to date genetic testing.   CANCER SCREENING RECOMMENDATIONS:  This result is reassuring and indicates that Ms. Santillano likely does not have an increased risk for a future cancer due to a mutation in one of these genes. This normal test also suggests that Ms. Epler's cancer was most likely not due to an inherited predisposition associated with one of these genes.  Most cancers happen by chance and this negative test suggests that her cancer falls into this category.  We, therefore, recommended she continue to follow the cancer management and screening guidelines provided by her oncology and primary healthcare provider.   RECOMMENDATIONS FOR FAMILY MEMBERS: Women in this family might be at some increased risk of developing cancer, over the general population risk, simply due to the family history of cancer. We recommended women in this family have a yearly mammogram beginning at age 65, or 44 years younger than the earliest onset of cancer, an annual clinical breast exam, and perform monthly breast self-exams. Women in this family should also have a gynecological exam as recommended by their primary provider. All family members should have a colonoscopy by age 22.  FOLLOW-UP: Lastly, we discussed with Ms. Eisler that cancer genetics is a rapidly advancing field and it is possible that new genetic tests will be appropriate for her and/or her family members in the future. We encouraged her to remain in contact with cancer genetics on an annual basis so we can update her personal and family histories and let her know of advances  in cancer genetics that may benefit this family.   Our contact number was provided. Ms. Gaulin questions were answered to her satisfaction, and she knows she is welcome to call us at anytime with additional questions or concerns.   Roma Kayser, MS, [facility] Certified Genetic Counselor [email or web address]

## 2017-03-08 ENCOUNTER — Encounter (HOSPITAL_COMMUNITY): Payer: Self-pay

## 2017-03-16 ENCOUNTER — Telehealth: Payer: Self-pay | Admitting: Obstetrics & Gynecology

## 2017-03-20 NOTE — Telephone Encounter (Signed)
LMOM regarding pt not needing a referral to headache center. Advised her to call back if that wasn't the case.

## 2017-03-20 NOTE — Telephone Encounter (Signed)
There must have been a misunderstanding I recommended the headache center in [place] for evaluation and management, she can call them directly, as far as I know she does not need a referral, if you find out differently please let me now

## 2017-10-17 ENCOUNTER — Other Ambulatory Visit: Payer: Self-pay | Admitting: Obstetrics & Gynecology

## 2018-10-04 ENCOUNTER — Other Ambulatory Visit: Payer: Managed Care, Other (non HMO) | Admitting: Obstetrics & Gynecology

## 2018-10-22 ENCOUNTER — Ambulatory Visit (HOSPITAL_COMMUNITY)
Admission: RE | Admit: 2018-10-22 | Discharge: 2018-10-22 | Disposition: A | Discharge status: Home / Self Care | Payer: Managed Care, Other (non HMO) | Source: Ambulatory Visit | Attending: Internal Medicine | Admitting: Internal Medicine

## 2018-10-22 ENCOUNTER — Other Ambulatory Visit (HOSPITAL_COMMUNITY): Payer: Self-pay | Admitting: Internal Medicine

## 2018-10-22 DIAGNOSIS — Z1231 Encounter for screening mammogram for malignant neoplasm of breast: Secondary | ICD-10-CM

## 2018-10-26 ENCOUNTER — Encounter: Payer: Self-pay | Admitting: Obstetrics & Gynecology

## 2018-10-26 ENCOUNTER — Ambulatory Visit (INDEPENDENT_AMBULATORY_CARE_PROVIDER_SITE_OTHER): Payer: Managed Care, Other (non HMO) | Admitting: Obstetrics & Gynecology

## 2018-10-26 VITALS — Ht 64.5 in | Wt 212.5 lb

## 2018-10-26 DIAGNOSIS — Z01419 Encounter for gynecological examination (general) (routine) without abnormal findings: Secondary | ICD-10-CM

## 2018-10-26 DIAGNOSIS — R739 Hyperglycemia, unspecified: Secondary | ICD-10-CM

## 2018-10-26 NOTE — Progress Notes (Signed)
Subjective:     [name] is a 47 y.o. female here for a routine exam.  No LMP recorded. Patient has had an ablation. [id number] Birth Control Method:  Tubal ligation Menstrual Calendar(currently):   Current complaints: none.   Current acute medical issues:  hyperglycemia   Recent Gynecologic History No LMP recorded. Patient has had an ablation. Last Pap: 2018,  normal Last mammogram: 2019,  normal  Past Medical History:  Diagnosis Date  . BV (bacterial vaginosis)    BV  . Cancer [facility]) 2009   papillary thyroid cancer  . Diabetes mellitus without complication ([place])   . Family history of breast cancer   . IBS (irritable bowel syndrome)     Past Surgical History:  Procedure Laterality Date  . ABLATION    . CARPAL TUNNEL RELEASE    . CESAREAN SECTION    . NECK AND CHEST LESION      OB History    Gravida  2   Para  2   Term  2   Preterm      AB      Living  2     SAB      TAB      Ectopic      Multiple      Live Births  2           Social History   Socioeconomic History  . Marital status: Married    Spouse name: Not on file  . Number of children: Not on file  . Years of education: Not on file  . Highest education level: Not on file  Occupational History  . Not on file  Social Needs  . Financial resource strain: Not on file  . Food insecurity:    Worry: Not on file    Inability: Not on file  . Transportation needs:    Medical: Not on file    Non-medical: Not on file  Tobacco Use  . Smoking status: Former Research scientist (life sciences)  . Smokeless tobacco: Never Used  Substance and Sexual Activity  . Alcohol use: Yes    Comment: occ  . Drug use: Never  . Sexual activity: Yes    Comment: ablation  Lifestyle  . Physical activity:    Days per week: Not on file    Minutes per session: Not on file  . Stress: Not on file  Relationships  . Social connections:    Talks on phone: Not on file    Gets together: Not on file    Attends religious service: Not on  file    Active member of club or organization: Not on file    Attends meetings of clubs or organizations: Not on file    Relationship status: Not on file  Other Topics Concern  . Not on file  Social History Narrative  . Not on file    Family History  Problem Relation Age of Onset  . Breast cancer Mother        died at age 74  . Mental illness Mother   . Hypertension Father   . Hyperlipidemia Father   . Diabetes Father   . Breast cancer Maternal Grandmother        died in her late 60s  . Lung cancer Maternal Uncle      Current Outpatient Medications:  .  citalopram (CELEXA) 20 MG tablet, Take 20 mg by mouth daily. , Disp: , Rfl:  .  fenofibrate micronized (LOFIBRA) 200 MG capsule,  Take 200 mg by mouth daily before breakfast., Disp: , Rfl:  .  hydrOXYzine (ATARAX/VISTARIL) 25 MG tablet, Take 25 mg by mouth at bedtime. , Disp: , Rfl:  .  SYNTHROID 175 MCG tablet, Take 175 mcg by mouth daily before breakfast. , Disp: , Rfl:  .  topiramate (TOPAMAX) 25 MG tablet, TAKE 1 TABLET TWICE A DAY, Disp: 180 tablet, Rfl: 4  Review of Systems  Review of Systems  Constitutional: Negative for fever, chills, weight loss, malaise/fatigue and diaphoresis.  HENT: Negative for hearing loss, ear pain, nosebleeds, congestion, sore throat, neck pain, tinnitus and ear discharge.   Eyes: Negative for blurred vision, double vision, photophobia, pain, discharge and redness.  Respiratory: Negative for cough, hemoptysis, sputum production, shortness of breath, wheezing and stridor.   Cardiovascular: Negative for chest pain, palpitations, orthopnea, claudication, leg swelling and PND.  Gastrointestinal: negative for abdominal pain. Negative for heartburn, nausea, vomiting, diarrhea, constipation, blood in stool and melena.  Genitourinary: Negative for dysuria, urgency, frequency, hematuria and flank pain.  Musculoskeletal: Negative for myalgias, back pain, joint pain and falls.  Skin: Negative for itching  and rash.  Neurological: Negative for dizziness, tingling, tremors, sensory change, speech change, focal weakness, seizures, loss of consciousness, weakness and headaches.  Endo/Heme/Allergies: Negative for environmental allergies and polydipsia. Does not bruise/bleed easily.  Psychiatric/Behavioral: Negative for depression, suicidal ideas, hallucinations, memory loss and substance abuse. The patient is not nervous/anxious and does not have insomnia.        Objective:  Height 5' 4.5" (1.638 m), weight 212 lb 8 oz (96.4 kg).   Physical Exam  Vitals reviewed. Constitutional: She is oriented to person, place, and time. She appears well-developed and well-nourished.  HENT:  Head: Normocephalic and atraumatic.        Right Ear: External ear normal.  Left Ear: External ear normal.  Nose: Nose normal.  Mouth/Throat: Oropharynx is clear and moist.  Eyes: Conjunctivae and EOM are normal. Pupils are equal, round, and reactive to light. Right eye exhibits no discharge. Left eye exhibits no discharge. No scleral icterus.  Neck: Normal range of motion. Neck supple. No tracheal deviation present. No thyromegaly present.  Cardiovascular: Normal rate, regular rhythm, normal heart sounds and intact distal pulses.  Exam reveals no gallop and no friction rub.   No murmur heard. Respiratory: Effort normal and breath sounds normal. No respiratory distress. She has no wheezes. She has no rales. She exhibits no tenderness.  GI: Soft. Bowel sounds are normal. She exhibits no distension and no mass. There is no tenderness. There is no rebound and no guarding.  Genitourinary:  Breasts no masses skin changes or nipple changes bilaterally      Vulva is normal without lesions Vagina is pink moist without discharge Cervix normal in appearance and pap is done Uterus is normal size shape and contour Adnexa is negative with normal sized ovaries   Musculoskeletal: Normal range of motion. She exhibits no edema and no  tenderness.  Neurological: She is alert and oriented to person, place, and time. She has normal reflexes. She displays normal reflexes. No cranial nerve deficit. She exhibits normal muscle tone. Coordination normal.  Skin: Skin is warm and dry. No rash noted. No erythema. No pallor.  Psychiatric: She has a normal mood and affect. Her behavior is normal. Judgment and thought content normal.       Medications Ordered at today's visit: No orders of the defined types were placed in this encounter.   Other orders placed  at today's visit: Orders Placed This Encounter  Procedures  . Hemoglobin A1C      Assessment:    Healthy female exam.    Plan:    Contraception: tubal ligation. Mammogram ordered. Follow up in: 1 year.   "The End of Diabetes" is recommended and pulled up for pt on [place]  Return in about 1 year (around 10/27/2019) for yearly.

## 2018-10-27 LAB — HEMOGLOBIN A1C
Est. average glucose Bld gHb Est-mCnc: 229 mg/dL
Hgb A1c MFr Bld: 9.6 % — ABNORMAL HIGH (ref 4.8–5.6)

## 2018-10-28 ENCOUNTER — Encounter: Payer: Self-pay | Admitting: Obstetrics & Gynecology

## 2018-12-19 ENCOUNTER — Other Ambulatory Visit: Payer: Self-pay | Admitting: *Deleted

## 2018-12-19 MED ORDER — TOPIRAMATE 25 MG PO TABS: 25.0000 mg | ORAL_TABLET | Freq: Two times a day (BID) | ORAL | 4 refills | Status: DC

## 2019-03-28 DIAGNOSIS — L0100 Impetigo, unspecified: Secondary | ICD-10-CM | POA: Diagnosis not present

## 2019-03-28 DIAGNOSIS — L080 Pyoderma: Secondary | ICD-10-CM | POA: Diagnosis not present

## 2019-03-28 DIAGNOSIS — D485 Neoplasm of uncertain behavior of skin: Secondary | ICD-10-CM | POA: Diagnosis not present

## 2019-05-01 DIAGNOSIS — L723 Sebaceous cyst: Secondary | ICD-10-CM | POA: Diagnosis not present

## 2019-05-06 DIAGNOSIS — E890 Postprocedural hypothyroidism: Secondary | ICD-10-CM | POA: Diagnosis not present

## 2019-05-06 DIAGNOSIS — E1165 Type 2 diabetes mellitus with hyperglycemia: Secondary | ICD-10-CM | POA: Diagnosis not present

## 2019-05-09 DIAGNOSIS — E1165 Type 2 diabetes mellitus with hyperglycemia: Secondary | ICD-10-CM | POA: Diagnosis not present

## 2019-05-09 DIAGNOSIS — E890 Postprocedural hypothyroidism: Secondary | ICD-10-CM | POA: Diagnosis not present

## 2019-05-09 DIAGNOSIS — C73 Malignant neoplasm of thyroid gland: Secondary | ICD-10-CM | POA: Diagnosis not present

## 2019-05-09 DIAGNOSIS — E7800 Pure hypercholesterolemia, unspecified: Secondary | ICD-10-CM | POA: Diagnosis not present

## 2019-05-16 DIAGNOSIS — L720 Epidermal cyst: Secondary | ICD-10-CM | POA: Diagnosis not present

## 2019-05-16 DIAGNOSIS — L905 Scar conditions and fibrosis of skin: Secondary | ICD-10-CM | POA: Diagnosis not present

## 2019-05-16 DIAGNOSIS — D485 Neoplasm of uncertain behavior of skin: Secondary | ICD-10-CM | POA: Diagnosis not present

## 2019-06-17 DIAGNOSIS — K589 Irritable bowel syndrome without diarrhea: Secondary | ICD-10-CM | POA: Diagnosis not present

## 2019-06-17 DIAGNOSIS — J309 Allergic rhinitis, unspecified: Secondary | ICD-10-CM | POA: Diagnosis not present

## 2019-06-17 DIAGNOSIS — Z1389 Encounter for screening for other disorder: Secondary | ICD-10-CM | POA: Diagnosis not present

## 2019-06-17 DIAGNOSIS — Z6832 Body mass index (BMI) 32.0-32.9, adult: Secondary | ICD-10-CM | POA: Diagnosis not present

## 2019-06-17 DIAGNOSIS — E6609 Other obesity due to excess calories: Secondary | ICD-10-CM | POA: Diagnosis not present

## 2019-06-17 DIAGNOSIS — E063 Autoimmune thyroiditis: Secondary | ICD-10-CM | POA: Diagnosis not present

## 2019-06-17 DIAGNOSIS — E1165 Type 2 diabetes mellitus with hyperglycemia: Secondary | ICD-10-CM | POA: Diagnosis not present

## 2019-06-17 DIAGNOSIS — E669 Obesity, unspecified: Secondary | ICD-10-CM | POA: Diagnosis not present

## 2019-06-17 DIAGNOSIS — Z0001 Encounter for general adult medical examination with abnormal findings: Secondary | ICD-10-CM | POA: Diagnosis not present

## 2019-06-17 DIAGNOSIS — E039 Hypothyroidism, unspecified: Secondary | ICD-10-CM | POA: Diagnosis not present

## 2019-06-17 DIAGNOSIS — C73 Malignant neoplasm of thyroid gland: Secondary | ICD-10-CM | POA: Diagnosis not present

## 2019-08-20 DIAGNOSIS — J329 Chronic sinusitis, unspecified: Secondary | ICD-10-CM | POA: Diagnosis not present

## 2019-08-20 DIAGNOSIS — R42 Dizziness and giddiness: Secondary | ICD-10-CM | POA: Diagnosis not present

## 2019-08-20 DIAGNOSIS — H8113 Benign paroxysmal vertigo, bilateral: Secondary | ICD-10-CM | POA: Diagnosis not present

## 2019-08-20 DIAGNOSIS — Z681 Body mass index (BMI) 19 or less, adult: Secondary | ICD-10-CM | POA: Diagnosis not present

## 2019-10-24 ENCOUNTER — Other Ambulatory Visit (HOSPITAL_COMMUNITY): Payer: Self-pay | Admitting: Obstetrics & Gynecology

## 2019-10-24 DIAGNOSIS — Z1231 Encounter for screening mammogram for malignant neoplasm of breast: Secondary | ICD-10-CM

## 2019-10-30 ENCOUNTER — Ambulatory Visit (HOSPITAL_COMMUNITY)
Admission: RE | Admit: 2019-10-30 | Discharge: 2019-10-30 | Disposition: A | Discharge status: Home / Self Care | Payer: BC Managed Care – PPO | Source: Ambulatory Visit | Attending: Obstetrics & Gynecology | Admitting: Obstetrics & Gynecology

## 2019-10-30 ENCOUNTER — Other Ambulatory Visit: Payer: Self-pay

## 2019-10-30 DIAGNOSIS — Z1231 Encounter for screening mammogram for malignant neoplasm of breast: Secondary | ICD-10-CM | POA: Diagnosis not present

## 2019-11-04 ENCOUNTER — Other Ambulatory Visit (HOSPITAL_COMMUNITY): Payer: Self-pay | Admitting: Obstetrics & Gynecology

## 2019-11-04 DIAGNOSIS — R928 Other abnormal and inconclusive findings on diagnostic imaging of breast: Secondary | ICD-10-CM

## 2019-11-12 ENCOUNTER — Ambulatory Visit (HOSPITAL_COMMUNITY)
Admission: RE | Admit: 2019-11-12 | Discharge: 2019-11-12 | Disposition: A | Discharge status: Home / Self Care | Payer: BC Managed Care – PPO | Source: Ambulatory Visit | Attending: Obstetrics & Gynecology | Admitting: Obstetrics & Gynecology

## 2019-11-12 ENCOUNTER — Other Ambulatory Visit: Payer: Self-pay

## 2019-11-12 ENCOUNTER — Ambulatory Visit (HOSPITAL_COMMUNITY): Admission: RE | Admit: 2019-11-12 | Payer: BC Managed Care – PPO | Source: Ambulatory Visit

## 2019-11-12 DIAGNOSIS — R928 Other abnormal and inconclusive findings on diagnostic imaging of breast: Secondary | ICD-10-CM | POA: Insufficient documentation

## 2019-11-26 DIAGNOSIS — E6609 Other obesity due to excess calories: Secondary | ICD-10-CM | POA: Diagnosis not present

## 2019-11-26 DIAGNOSIS — R5383 Other fatigue: Secondary | ICD-10-CM | POA: Diagnosis not present

## 2019-11-26 DIAGNOSIS — Z6833 Body mass index (BMI) 33.0-33.9, adult: Secondary | ICD-10-CM | POA: Diagnosis not present

## 2019-11-26 DIAGNOSIS — E1165 Type 2 diabetes mellitus with hyperglycemia: Secondary | ICD-10-CM | POA: Diagnosis not present

## 2019-11-26 DIAGNOSIS — E7849 Other hyperlipidemia: Secondary | ICD-10-CM | POA: Diagnosis not present

## 2019-11-26 DIAGNOSIS — E039 Hypothyroidism, unspecified: Secondary | ICD-10-CM | POA: Diagnosis not present

## 2020-01-31 ENCOUNTER — Encounter: Payer: Self-pay | Admitting: Internal Medicine

## 2020-03-05 ENCOUNTER — Other Ambulatory Visit: Payer: Self-pay | Admitting: Obstetrics & Gynecology

## 2020-03-26 DIAGNOSIS — E6609 Other obesity due to excess calories: Secondary | ICD-10-CM | POA: Diagnosis not present

## 2020-03-26 DIAGNOSIS — E119 Type 2 diabetes mellitus without complications: Secondary | ICD-10-CM | POA: Diagnosis not present

## 2020-03-26 DIAGNOSIS — E063 Autoimmune thyroiditis: Secondary | ICD-10-CM | POA: Diagnosis not present

## 2020-03-26 DIAGNOSIS — Z6832 Body mass index (BMI) 32.0-32.9, adult: Secondary | ICD-10-CM | POA: Diagnosis not present

## 2020-03-26 DIAGNOSIS — Z1389 Encounter for screening for other disorder: Secondary | ICD-10-CM | POA: Diagnosis not present

## 2020-03-26 DIAGNOSIS — E1165 Type 2 diabetes mellitus with hyperglycemia: Secondary | ICD-10-CM | POA: Diagnosis not present

## 2020-08-13 DIAGNOSIS — E063 Autoimmune thyroiditis: Secondary | ICD-10-CM | POA: Diagnosis not present

## 2020-08-13 DIAGNOSIS — J329 Chronic sinusitis, unspecified: Secondary | ICD-10-CM | POA: Diagnosis not present

## 2020-08-13 DIAGNOSIS — Z681 Body mass index (BMI) 19 or less, adult: Secondary | ICD-10-CM | POA: Diagnosis not present

## 2020-08-27 ENCOUNTER — Other Ambulatory Visit (HOSPITAL_COMMUNITY): Payer: Self-pay | Admitting: Internal Medicine

## 2020-08-27 ENCOUNTER — Other Ambulatory Visit: Payer: Self-pay | Admitting: Internal Medicine

## 2020-08-27 DIAGNOSIS — R42 Dizziness and giddiness: Secondary | ICD-10-CM

## 2020-08-27 DIAGNOSIS — R519 Headache, unspecified: Secondary | ICD-10-CM

## 2020-09-10 DIAGNOSIS — G43909 Migraine, unspecified, not intractable, without status migrainosus: Secondary | ICD-10-CM | POA: Diagnosis not present

## 2020-09-10 DIAGNOSIS — R269 Unspecified abnormalities of gait and mobility: Secondary | ICD-10-CM | POA: Diagnosis not present

## 2020-09-10 DIAGNOSIS — R42 Dizziness and giddiness: Secondary | ICD-10-CM | POA: Diagnosis not present

## 2020-09-10 DIAGNOSIS — I10 Essential (primary) hypertension: Secondary | ICD-10-CM | POA: Diagnosis not present

## 2020-09-11 ENCOUNTER — Other Ambulatory Visit: Payer: Self-pay | Admitting: Neurology

## 2020-09-11 ENCOUNTER — Other Ambulatory Visit (HOSPITAL_COMMUNITY): Payer: Self-pay | Admitting: Neurology

## 2020-09-11 DIAGNOSIS — R269 Unspecified abnormalities of gait and mobility: Secondary | ICD-10-CM

## 2020-09-18 ENCOUNTER — Ambulatory Visit (HOSPITAL_COMMUNITY)
Admission: RE | Admit: 2020-09-18 | Discharge: 2020-09-18 | Disposition: A | Discharge status: Home / Self Care | Payer: BC Managed Care – PPO | Source: Ambulatory Visit | Attending: Neurology | Admitting: Neurology

## 2020-09-18 ENCOUNTER — Other Ambulatory Visit: Payer: Self-pay

## 2020-09-18 DIAGNOSIS — I6782 Cerebral ischemia: Secondary | ICD-10-CM | POA: Diagnosis not present

## 2020-09-18 DIAGNOSIS — R269 Unspecified abnormalities of gait and mobility: Secondary | ICD-10-CM | POA: Insufficient documentation

## 2020-09-18 DIAGNOSIS — J341 Cyst and mucocele of nose and nasal sinus: Secondary | ICD-10-CM | POA: Diagnosis not present

## 2020-09-18 DIAGNOSIS — R9082 White matter disease, unspecified: Secondary | ICD-10-CM | POA: Diagnosis not present

## 2020-10-08 DIAGNOSIS — R42 Dizziness and giddiness: Secondary | ICD-10-CM | POA: Diagnosis not present

## 2020-10-08 DIAGNOSIS — R269 Unspecified abnormalities of gait and mobility: Secondary | ICD-10-CM | POA: Diagnosis not present

## 2020-10-08 DIAGNOSIS — I10 Essential (primary) hypertension: Secondary | ICD-10-CM | POA: Diagnosis not present

## 2020-10-08 DIAGNOSIS — G43909 Migraine, unspecified, not intractable, without status migrainosus: Secondary | ICD-10-CM | POA: Diagnosis not present

## 2020-10-15 DIAGNOSIS — H8102 Meniere's disease, left ear: Secondary | ICD-10-CM | POA: Diagnosis not present

## 2020-10-15 DIAGNOSIS — H9042 Sensorineural hearing loss, unilateral, left ear, with unrestricted hearing on the contralateral side: Secondary | ICD-10-CM | POA: Diagnosis not present

## 2020-10-15 DIAGNOSIS — R42 Dizziness and giddiness: Secondary | ICD-10-CM | POA: Diagnosis not present

## 2020-10-21 DIAGNOSIS — R42 Dizziness and giddiness: Secondary | ICD-10-CM | POA: Diagnosis not present

## 2020-10-26 DIAGNOSIS — H9042 Sensorineural hearing loss, unilateral, left ear, with unrestricted hearing on the contralateral side: Secondary | ICD-10-CM | POA: Diagnosis not present

## 2020-10-26 DIAGNOSIS — R42 Dizziness and giddiness: Secondary | ICD-10-CM | POA: Diagnosis not present

## 2020-11-11 ENCOUNTER — Other Ambulatory Visit (HOSPITAL_COMMUNITY): Payer: Self-pay | Admitting: Neurology

## 2020-11-11 ENCOUNTER — Other Ambulatory Visit: Payer: Self-pay | Admitting: Neurology

## 2020-11-11 DIAGNOSIS — R42 Dizziness and giddiness: Secondary | ICD-10-CM | POA: Diagnosis not present

## 2020-11-11 DIAGNOSIS — R269 Unspecified abnormalities of gait and mobility: Secondary | ICD-10-CM | POA: Diagnosis not present

## 2020-11-11 DIAGNOSIS — G932 Benign intracranial hypertension: Secondary | ICD-10-CM | POA: Diagnosis not present

## 2020-11-11 DIAGNOSIS — G43909 Migraine, unspecified, not intractable, without status migrainosus: Secondary | ICD-10-CM | POA: Diagnosis not present

## 2020-11-20 ENCOUNTER — Encounter (HOSPITAL_COMMUNITY): Payer: Self-pay

## 2020-11-20 ENCOUNTER — Ambulatory Visit (HOSPITAL_COMMUNITY)
Admission: RE | Admit: 2020-11-20 | Discharge: 2020-11-20 | Disposition: A | Discharge status: Home / Self Care | Payer: BC Managed Care – PPO | Source: Ambulatory Visit | Attending: Neurology | Admitting: Neurology

## 2020-11-20 ENCOUNTER — Other Ambulatory Visit: Payer: Self-pay

## 2020-11-20 DIAGNOSIS — G932 Benign intracranial hypertension: Secondary | ICD-10-CM

## 2020-11-20 LAB — CSF CELL COUNT WITH DIFFERENTIAL
RBC Count, CSF: 68 /mm3 — ABNORMAL HIGH
Tube #: 1
WBC, CSF: 2 /mm3 (ref 0–5)

## 2020-11-20 LAB — GLUCOSE, CSF: Glucose, CSF: 202 mg/dL — ABNORMAL HIGH (ref 40–70)

## 2020-11-20 LAB — PROTEIN, CSF: Total  Protein, CSF: 52 mg/dL — ABNORMAL HIGH (ref 15–45)

## 2020-11-20 MED ORDER — ACETAMINOPHEN 325 MG PO TABS
ORAL_TABLET | ORAL | Status: AC
  Filled 2020-11-20: qty 2

## 2020-11-20 MED ORDER — ACETAMINOPHEN 325 MG PO TABS
650.0000 mg | ORAL_TABLET | ORAL | Status: DC | PRN
  Administered 2020-11-20: 650 mg via ORAL

## 2020-11-20 NOTE — Progress Notes (Signed)
Patient arrived from Radiology via stretcher lying supine, flat.

## 2020-11-20 NOTE — Progress Notes (Signed)
Patient ambulated to bathroom with assistance without difficulty. Patient able to start and stop urine stream without difficulty.

## 2020-11-20 NOTE — Progress Notes (Signed)
Patient discharged to home with husband driving, patient ambulating without difficulty.

## 2020-11-20 NOTE — Discharge Instructions (Signed)
Please remain flat for the remainder of the day as much as possible.  Follow up with your referring physician for further recommendations.    Lumbar Puncture, Care After This sheet gives you information about how to care for yourself after your procedure. Your health care provider may also give you more specific instructions. If you have problems or questions, contact your health care provider. What can I expect after the procedure? After the procedure, it is common to have:  Mild discomfort or pain at the puncture site.  A mild headache that is relieved with pain medicines. Follow these instructions at home: Activity   Lie down flat or rest for as long as directed by your health care provider.  Return to your normal activities as told by your health care provider. Ask your health care provider what activities are safe for you.  Avoid lifting anything heavier than 10 lb (4.5 kg) for at least 12 hours after the procedure.  Do not drive for 24 hours if you were given a medicine to help you relax (sedative) during your procedure.  Do not drive or use heavy machinery while taking prescription pain medicine. Puncture site care  Remove or change your bandage (dressing) as told by your health care provider.  Check your puncture area every day for signs of infection. Check for: ? More pain. ? Redness or swelling. ? Fluid or blood leaking from the puncture site. ? Warmth. ? Pus or a bad smell. General instructions  Take over-the-counter and prescription medicines only as told by your health care provider.  Drink enough fluids to keep your urine clear or pale yellow. Your health care provider may recommend drinking caffeine to prevent a headache.  Keep all follow-up visits as told by your health care provider. This is important. Contact a health care provider if:  You have fever or chills.  You have nausea or vomiting.  You have a headache that lasts for more than 2 days or  does not get better with medicine. Get help right away if:  You develop any of the following in your legs: ? Weakness. ? Numbness. ? Tingling.  You are unable to control when you urinate or have a bowel movement (incontinence).  You have signs of infection around your puncture site, such as: ? More pain. ? Redness or swelling. ? Fluid or blood leakage. ? Warmth. ? Pus or a bad smell.  You are dizzy or you feel like you might faint.  You have a severe headache, especially when you sit or stand. Summary  A lumbar puncture is a procedure in which a small needle is inserted into the lower back to remove fluid that surrounds the brain and spinal cord.  After this procedure, it is common to have a headache and pain around the needle insertion area.  Lying flat, staying hydrated, and drinking caffeine can help prevent headaches.  Monitor your needle insertion site for signs of infection, including warmth, fluid, or more pain.  Get help right away if you develop leg weakness, leg numbness, incontinence, or severe headaches. This information is not intended to replace advice given to you by your health care provider. Make sure you discuss any questions you have with your health care provider. Document Revised: 01/04/2017 Document Reviewed: 01/04/2017 Elsevier Patient Education  2020 [employer].

## 2020-11-20 NOTE — Progress Notes (Signed)
Husband at bedside and patient tolerating lunch brought in from husband without difficulty.  Pressure in back is "better lying flat on my side"

## 2020-11-20 NOTE — Discharge Instructions (Signed)
Lumbar Puncture, Care After This sheet gives you information about how to care for yourself after your procedure. Your health care provider may also give you more specific instructions. If you have problems or questions, contact your health care provider. What can I expect after the procedure? After the procedure, it is common to have:  Mild discomfort or pain at the puncture site.  A mild headache that is relieved with pain medicines. Follow these instructions at home: Activity   Lie down flat or rest for as long as directed by your health care provider.  Return to your normal activities as told by your health care provider. Ask your health care provider what activities are safe for you.  Avoid lifting anything heavier than 10 lb (4.5 kg) for at least 12 hours after the procedure.  Do not drive for 24 hours if you were given a medicine to help you relax (sedative) during your procedure.  Do not drive or use heavy machinery while taking prescription pain medicine. Puncture site care  Remove or change your bandage (dressing) as told by your health care provider.  Check your puncture area every day for signs of infection. Check for: ? More pain. ? Redness or swelling. ? Fluid or blood leaking from the puncture site. ? Warmth. ? Pus or a bad smell. General instructions  Take over-the-counter and prescription medicines only as told by your health care provider.  Drink enough fluids to keep your urine clear or pale yellow. Your health care provider may recommend drinking caffeine to prevent a headache.  Keep all follow-up visits as told by your health care provider. This is important. Contact a health care provider if:  You have fever or chills.  You have nausea or vomiting.  You have a headache that lasts for more than 2 days or does not get better with medicine. Get help right away if:  You develop any of the following in your  legs: ? Weakness. ? Numbness. ? Tingling.  You are unable to control when you urinate or have a bowel movement (incontinence).  You have signs of infection around your puncture site, such as: ? More pain. ? Redness or swelling. ? Fluid or blood leakage. ? Warmth. ? Pus or a bad smell.  You are dizzy or you feel like you might faint.  You have a severe headache, especially when you sit or stand. Summary  A lumbar puncture is a procedure in which a small needle is inserted into the lower back to remove fluid that surrounds the brain and spinal cord.  After this procedure, it is common to have a headache and pain around the needle insertion area.  Lying flat, staying hydrated, and drinking caffeine can help prevent headaches.  Monitor your needle insertion site for signs of infection, including warmth, fluid, or more pain.  Get help right away if you develop leg weakness, leg numbness, incontinence, or severe headaches. This information is not intended to replace advice given to you by your health care provider. Make sure you discuss any questions you have with your health care provider. Document Revised: 01/04/2017 Document Reviewed: 01/04/2017 Elsevier Patient Education  2020 [employer] Inc.  

## 2020-11-30 DIAGNOSIS — E1142 Type 2 diabetes mellitus with diabetic polyneuropathy: Secondary | ICD-10-CM | POA: Insufficient documentation

## 2020-12-03 DIAGNOSIS — E1140 Type 2 diabetes mellitus with diabetic neuropathy, unspecified: Secondary | ICD-10-CM | POA: Diagnosis not present

## 2020-12-03 DIAGNOSIS — I951 Orthostatic hypotension: Secondary | ICD-10-CM | POA: Diagnosis not present

## 2020-12-03 DIAGNOSIS — R42 Dizziness and giddiness: Secondary | ICD-10-CM | POA: Diagnosis not present

## 2020-12-03 DIAGNOSIS — G43909 Migraine, unspecified, not intractable, without status migrainosus: Secondary | ICD-10-CM | POA: Diagnosis not present

## 2020-12-04 DIAGNOSIS — I951 Orthostatic hypotension: Secondary | ICD-10-CM | POA: Insufficient documentation

## 2020-12-10 ENCOUNTER — Ambulatory Visit (HOSPITAL_COMMUNITY): Payer: BC Managed Care – PPO | Attending: Otolaryngology | Admitting: Physical Therapy

## 2020-12-10 ENCOUNTER — Other Ambulatory Visit: Payer: Self-pay

## 2020-12-10 ENCOUNTER — Encounter (HOSPITAL_COMMUNITY): Payer: Self-pay | Admitting: Physical Therapy

## 2020-12-10 DIAGNOSIS — R42 Dizziness and giddiness: Secondary | ICD-10-CM

## 2020-12-10 NOTE — Patient Instructions (Signed)
  Eye Motion    Stand with hands out in front of head. Move eyes from one hand to the other as quickly as possible. Stop if you become dizzy or nauseous. Repeat _10___ times. Do _4-6___ sessions per day. Repeat looking up and down as well as diagonal. Upper Cervical Rotation    Rotate head slowly from side to side as if saying "no". Do not turn head completely to either side. Keep motion small. Repeat __10__ times per set. Do __1__ sets per session. Do _3___ sessions per day.  http://orth.exer.us/375   Copyright  VHI. All rights reserved.    http://gt2.exer.us/530   Copyright  VHI. All rights reserved.  Gaze Stabilization: Tip Card  1.Target must remain in focus, not blurry, and appear stationary while head is in motion. 2.Perform exercises with small head movements (45 to either side of midline). 3.Increase speed of head motion so long as target is in focus. 4.If you wear eyeglasses, be sure you can see target through lens (therapist will give specific instructions for bifocal / progressive lenses). 5.These exercises may provoke dizziness or nausea. Work through these symptoms. If too dizzy, slow head movement slightly. Rest between each exercise. 6.Exercises demand concentration; avoid distractions. 7.For safety, perform standing exercises close to a counter, wall, corner, or next to someone.  Copyright  VHI. All rights reserved.

## 2020-12-10 NOTE — Therapy (Signed)
[facility] [address] [place], [place], [address] Phone: [phone]   Fax:  [phone]  Physical Therapy Evaluation  Patient Details  Name: [name] MRN: [id number] Date of Birth: 1971-11-29 Referring Provider (PT): Newman Pies   Encounter Date: 12/10/2020   PT End of Session - 12/10/20 1715    Number of Visits 8    Date for PT Re-Evaluation 01/09/21    Authorization Type BCBS    Progress Note Due on Visit 8    PT Start Time 1615    PT Stop Time 1655    PT Time Calculation (min) 40 min    Activity Tolerance Patient tolerated treatment well    Behavior During Therapy [facility] for tasks assessed/performed           Past Medical History:  Diagnosis Date  . BV (bacterial vaginosis)    BV  . Cancer [facility]) 2009   papillary thyroid cancer  . Diabetes mellitus without complication (HCC)   . Family history of breast cancer   . IBS (irritable bowel syndrome)     Past Surgical History:  Procedure Laterality Date  . ABLATION    . CARPAL TUNNEL RELEASE    . CESAREAN SECTION    . NECK AND CHEST LESION      There were no vitals filed for this visit.        [facility] PT Assessment - 12/10/20 0001      Assessment   Medical Diagnosis Dizziness    Referring Provider (PT) Su Teoh    Prior Therapy none      Precautions   Precautions None      Restrictions   Weight Bearing Restrictions No      Balance Screen   Has the patient fallen in the past 6 months Yes    How many times? 2    Has the patient had a decrease in activity level because of a fear of falling?  Yes    Is the patient reluctant to leave their home because of a fear of falling?  No      Prior Function   Level of Independence Independent      Cognition   Overall Cognitive Status Within Functional Limits for tasks assessed      Functional Tests   Functional tests Single leg stance      Single Leg Stance   Comments Rt: 0; Lt 10seconds      ROM / Strength    AROM / PROM / Strength AROM      AROM   AROM Assessment Site Cervical    Cervical - Right Rotation 45    Cervical - Left Rotation 30                  Vestibular Assessment - 12/10/20 0001      Symptom Behavior   Subjective history of current problem [name] states that she has had a lot of dizziness; she has dizziness every time she stands, when she looks up and going from sit to stand.  Some days are better than others.  This has been going on since September.  She was dx with central vestibular issues and is now being referred to skilled therapy.    Type of Dizziness  Imbalance;Unsteady with head/body turns    Frequency of Dizziness 20-30 x a day    Duration of Dizziness normally last for an hour.    Symptom Nature Motion provoked  Aggravating Factors Activity in general    Relieving Factors Lying supine    Progression of Symptoms Worse    History of similar episodes none      Oculomotor Exam   Head shaking Horizontal R beating nystagmus   moderate sx   Smooth Pursuits Comment   pt begins to feel nauseated with this activity     Vestibulo-Ocular Reflex   VOR 1 Head Only (x 1 viewing) double vision with Rt      Positional Testing   Dix-Hallpike Dix-Hallpike Right;Dix-Hallpike Left      Dix-Hallpike Right   Dix-Hallpike Right Duration 30 seconds    Dix-Hallpike Right Symptoms Upbeat, right rotatory nystagmus      Dix-Hallpike Left   Dix-Hallpike Left Symptoms No nystagmus      Cognition   Cognition Orientation Level Appropriate for developmental age      Positional Sensitivities   Right Hallpike Mild dizziness    Up from Right Hallpike Lightheadedness    Up from Left Hallpike Mild dizziness              Objective measurements completed on examination: See above findings.       [facility] PT Treatment/Exercise - 12/10/20 0001      Exercises   Exercises Neck      Neck Exercises: Seated   Cervical Rotation Both;5 reps    Other Seated Exercise  eye exercises all direction x 5 each           Vestibular Treatment/Exercise - 12/10/20 0001      Vestibular Treatment/Exercise   Vestibular Treatment Provided Canalith Repositioning    Canalith Repositioning Epley Manuever Right       EPLEY MANUEVER RIGHT   Number of Reps  1                 PT Education - 12/10/20 1637    Education Details HEP    Person(s) Educated Patient    Methods Explanation;Handout    Comprehension Verbalized understanding;Returned demonstration            PT Short Term Goals - 12/10/20 1709      PT SHORT TERM GOAL #1   Title Pt to be dizzy 10 or less times a day    Time 2    Period Weeks    Status New    Target Date 12/24/20      PT SHORT TERM GOAL #2   Title Pt cervical rotation to increase to 55 degrees B to assist in safer driving    Time 2    Period Weeks    Status New             PT Long Term Goals - 12/10/20 1711      PT LONG TERM GOAL #1   Title Pt to be having 5 or less bouts of dizziness a day    Time 4    Period Weeks    Status New    Target Date 01/07/21      PT LONG TERM GOAL #2   Title Pt to be able to turn her head 65 degrees in both directions for safer driving    Time 4    Period Weeks      PT LONG TERM GOAL #3   Title Pt to be I in her HEP and confident in continuing them on her own until no dizziness    Time 4    Status New  PT LONG TERM GOAL #4   Title PT to be able to single leg stance for 30 seconds B for decrease risk of falling    Time 4    Period Weeks    Status New                  Plan - 12/10/20 1659    Clinical Impression Statement [name] is a 50 yo female who states that she has been dizzy since September.  The dizziness was progressing, however, the medication that her MD gave her seems to have helped a slight bit.  She has been referred to skilled PT.  Evaluation demonstrates positive Rt Dixhalpike; decreased cervical ROM, decreased balance and decreased activity  tolerance.  [name] will benefit from skilled PT to address these issues and improve her functional ability and decrease her risk of falling    Personal Factors and Comorbidities Past/Current Experience;Time since onset of injury/illness/exacerbation    Examination-Activity Limitations Bed Mobility;Bend;Bathing;Carry;Dressing;Locomotion Level;Stairs;Stand    Examination-Participation Restrictions Cleaning;Community Activity;Driving;Laundry;Meal Prep;Occupation    Stability/Clinical Decision Making Evolving/Moderate complexity    Clinical Decision Making Moderate    Rehab Potential Good    PT Frequency 2x / week    PT Duration 4 weeks    PT Treatment/Interventions Manual techniques;Manual lymph drainage;Patient/family education;Therapeutic exercise;Balance training;Neuromuscular re-education    PT Next Visit Plan Complete Eply manuever, try nose to knee habituation,    PT Home Exercise Plan cervical ROM and eye exercises.           Patient will benefit from skilled therapeutic intervention in order to improve the following deficits and impairments:  Decreased activity tolerance,Decreased balance,Decreased range of motion,Dizziness,Postural dysfunction  Visit Diagnosis: Dizziness and giddiness     Problem List Patient Active Problem List   Diagnosis Date Noted  . Genetic testing 02/21/2017  . Family history of breast cancer   . Cancer [facility]) 12/06/2007    [name], PT CLT [phone] 12/10/2020, 5:18 PM  [place] [address] [place], [place], [address] Phone: [phone]   Fax:  [phone]  Name: [name] MRN: [id number] Date of Birth: 12-25-1970

## 2020-12-11 ENCOUNTER — Ambulatory Visit (HOSPITAL_COMMUNITY): Payer: BC Managed Care – PPO

## 2020-12-11 ENCOUNTER — Telehealth (HOSPITAL_COMMUNITY): Payer: Self-pay

## 2020-12-11 NOTE — Telephone Encounter (Signed)
pt cancelled appt for today because she does not have a ride

## 2020-12-15 ENCOUNTER — Encounter (HOSPITAL_COMMUNITY): Payer: Self-pay | Admitting: Physical Therapy

## 2020-12-15 ENCOUNTER — Ambulatory Visit (HOSPITAL_COMMUNITY): Payer: BC Managed Care – PPO | Admitting: Physical Therapy

## 2020-12-15 ENCOUNTER — Other Ambulatory Visit: Payer: Self-pay

## 2020-12-15 DIAGNOSIS — R42 Dizziness and giddiness: Secondary | ICD-10-CM

## 2020-12-15 NOTE — Therapy (Signed)
[place], [place], [address] Phone: [phone]   Fax:  [phone]  Physical Therapy Treatment  Patient Details  Name: ANUJA MANKA MRN: [id number] Date of Birth: 1971-04-06 Referring Provider (PT): Leta Baptist   Encounter Date: 12/15/2020   PT End of Session - 12/15/20 1519    Visit Number 2    Number of Visits 8    Date for PT Re-Evaluation 01/09/21    Authorization Type BCBS    Progress Note Due on Visit 8    PT Start Time 1430    PT Stop Time 1515    PT Time Calculation (min) 45 min    Activity Tolerance Patient tolerated treatment well    Behavior During Therapy [facility] for tasks assessed/performed           Past Medical History:  Diagnosis Date  . BV (bacterial vaginosis)    BV  . Cancer [facility]) 2009   papillary thyroid cancer  . Diabetes mellitus without complication ([place])   . Family history of breast cancer   . IBS (irritable bowel syndrome)     Past Surgical History:  Procedure Laterality Date  . ABLATION    . CARPAL TUNNEL RELEASE    . CESAREAN SECTION    . NECK AND CHEST LESION      There were no vitals filed for this visit.   Subjective Assessment - 12/15/20 1517    Subjective Patient says she has been compliant with HEP. Still has bouts of dizziness lasting the better part of the day.    Currently in Pain? No/denies                              Vestibular Treatment/Exercise - 12/15/20 0001      Vestibular Treatment/Exercise   Canalith Repositioning Epley Manuever Right    Gaze Exercises X1 Viewing Horizontal   2 x 10      EPLEY MANUEVER RIGHT   Number of Reps  1    Response Details  Increased "swimmy headedness" no vertigo, no nystagmus, did note diplopia in supine with head rotated toward LT, resolved when moved from position to sidelying                   PT Short Term Goals - 12/10/20 1709      PT SHORT TERM GOAL #1   Title Pt to be dizzy 10  or less times a day    Time 2    Period Weeks    Status New    Target Date 12/24/20      PT SHORT TERM GOAL #2   Title Pt cervical rotation to increase to 55 degrees B to assist in safer driving    Time 2    Period Weeks    Status New             PT Long Term Goals - 12/10/20 1711      PT LONG TERM GOAL #1   Title Pt to be having 5 or less bouts of dizziness a day    Time 4    Period Weeks    Status New    Target Date 01/07/21      PT LONG TERM GOAL #2   Title Pt to be able to turn her head 65 degrees in both directions for safer driving    Time 4    Period  Weeks      PT LONG TERM GOAL #3   Title Pt to be I in her HEP and confident in continuing them on her own until no dizziness    Time 4    Status New      PT LONG TERM GOAL #4   Title PT to be able to single leg stance for 30 seconds B for decrease risk of falling    Time 4    Period Weeks    Status New                 Plan - 12/15/20 1602    Clinical Impression Statement Performed further assessment and patient education today. Patient does not describe symptoms as spinning in nature. She reports dizziness is often lightheadedness or feeling "swimmy headed". Patient does report recently being diagnosed with orthostatic hypotension. Performed Eppley maneuver as continuation of plan from last session. Patient with no visible nystagmus or report of vertigo, but does describe "swimmy headed" feeling and transient diplopia. Symptoms resolved once sitting upright and patient returned to baseline status. Assessed and added VOR to HEP. Patient reports similar onset of dizzy symptoms with about 10 reps. She required cued for proper cadence. Educated patient on what BPPV is and findings from evaluation that prompted Eppley maneuver today. Discussed purpose of VOR and issued HEP handout.    Personal Factors and Comorbidities Past/Current Experience;Time since onset of injury/illness/exacerbation    Examination-Activity  Limitations Bed Mobility;Bend;Bathing;Carry;Dressing;Locomotion Level;Stairs;Stand    Examination-Participation Restrictions Cleaning;Community Activity;Driving;Laundry;Meal Prep;Occupation    Stability/Clinical Decision Making Evolving/Moderate complexity    Rehab Potential Good    PT Frequency 2x / week    PT Duration 4 weeks    PT Treatment/Interventions Manual techniques;Manual lymph drainage;Patient/family education;Therapeutic exercise;Balance training;Neuromuscular re-education    PT Next Visit Plan Continue to progress habituation exercises as tolerated. Sit to supine, knee to nose. Try standing head turns and nods when ready    PT Home Exercise Plan cervical ROM and eye exercises, 12/15/20: VOR           Patient will benefit from skilled therapeutic intervention in order to improve the following deficits and impairments:  Decreased activity tolerance,Decreased balance,Decreased range of motion,Dizziness,Postural dysfunction  Visit Diagnosis: Dizziness and giddiness     Problem List Patient Active Problem List   Diagnosis Date Noted  . Genetic testing 02/21/2017  . Family history of breast cancer   . Cancer ([place]) 12/06/2007    4:12 PM, 12/15/20 Josue Hector PT DPT  Physical Therapist with [facility]  [phone] [place] [address] [place], [place], [address] Phone: [phone]   Fax:  [phone]  Name: LOETTA CONNELLEY MRN: [id number] Date of Birth: May 17, 1971

## 2020-12-15 NOTE — Patient Instructions (Signed)
Access Code: [email or web address] URL: [email or web address] Date: 12/15/2020 Prepared by: Josue Hector  Exercises Seated Gaze Stabilization with Head Rotation - 4 x daily - 7 x weekly - 2 sets - 10 reps

## 2020-12-18 ENCOUNTER — Ambulatory Visit (HOSPITAL_COMMUNITY): Payer: BC Managed Care – PPO

## 2020-12-18 ENCOUNTER — Other Ambulatory Visit: Payer: Self-pay

## 2020-12-18 ENCOUNTER — Encounter (HOSPITAL_COMMUNITY): Payer: Self-pay

## 2020-12-18 DIAGNOSIS — R42 Dizziness and giddiness: Secondary | ICD-10-CM

## 2020-12-18 NOTE — Therapy (Signed)
[place], [place], [address] Phone: [phone]   Fax:  [phone]  Physical Therapy Treatment  Patient Details  Name: [name] MRN: [id number] Date of Birth: Mar 24, 1971 Referring Provider (PT): Leta Baptist   Encounter Date: 12/18/2020   PT End of Session - 12/18/20 0838    Visit Number 3    Number of Visits 8    Date for PT Re-Evaluation 01/09/21    Authorization Type BCBS    Progress Note Due on Visit 8    PT Start Time 0832    PT Stop Time 0913    PT Time Calculation (min) 41 min    Activity Tolerance Patient tolerated treatment well   swimmy headedness with transition of position   Behavior During Therapy [facility] for tasks assessed/performed           Past Medical History:  Diagnosis Date  . BV (bacterial vaginosis)    BV  . Cancer [facility]) 2009   papillary thyroid cancer  . Diabetes mellitus without complication ([place])   . Family history of breast cancer   . IBS (irritable bowel syndrome)     Past Surgical History:  Procedure Laterality Date  . ABLATION    . CARPAL TUNNEL RELEASE    . CESAREAN SECTION    . NECK AND CHEST LESION      Vitals:     Subjective Assessment - 12/18/20 0835    Subjective Pt reports dizziness worse at night vs morning.  Reports swimmy headed.  Stated dizziness resolves in 20-30".  Reports she attended a funeal yesterday and difficult transition from concrete to grass, had to stand by husband for stability.              Vitals:  Supine: 159/102 mmHg 89 bpm 70minutes Sitting: 142/98 mmHg 90 bpm 2 minutes Standing: 161/124 mmHg 99bpm     Vestibular Assessment - 12/18/20 0001      Positional Sensitivities   Supine to Sitting Severe dizziness   5 reps to Lt then Rt; 23" to resolve   Nose to Right Knee Severe dizziness   dizziness resolved in 40"   Nose to Left Knee Moderate dizziness   dizziness resolved in 40"                    Vestibular  Treatment/Exercise - 12/18/20 0001      Vestibular Treatment/Exercise   Habituation Exercises --   nose to knee; sit to supine 5x   Gaze Exercises Eye/Head Exercise Horizontal;Eye/Head Exercise Vertical   Standing WBOS with posterior support.  Reports mild symptoms.  Increased swimmyheadedness when looks to the Rt and up                  PT Short Term Goals - 12/10/20 1709      PT SHORT TERM GOAL #1   Title Pt to be dizzy 10 or less times a day    Time 2    Period Weeks    Status New    Target Date 12/24/20      PT SHORT TERM GOAL #2   Title Pt cervical rotation to increase to 55 degrees B to assist in safer driving    Time 2    Period Weeks    Status New             PT Long Term Goals - 12/10/20 1711      PT LONG TERM  GOAL #1   Title Pt to be having 5 or less bouts of dizziness a day    Time 4    Period Weeks    Status New    Target Date 01/07/21      PT LONG TERM GOAL #2   Title Pt to be able to turn her head 65 degrees in both directions for safer driving    Time 4    Period Weeks      PT LONG TERM GOAL #3   Title Pt to be I in her HEP and confident in continuing them on her own until no dizziness    Time 4    Status New      PT LONG TERM GOAL #4   Title PT to be able to single leg stance for 30 seconds B for decrease risk of falling    Time 4    Period Weeks    Status New                 Plan - 12/18/20 0900    Clinical Impression Statement Assessed vitals with orthostatic hypotension, noted high BP encouraged pt to discuss with MD.  Session focus with habitution and gazing exercises.  Pt reports mild to moderate symptoms that increase when looks to Rt.  No nystagmus noted through session.  Progressed gazing exercises to eye then head movements with mild to moderate symptoms.    Personal Factors and Comorbidities Past/Current Experience;Time since onset of injury/illness/exacerbation    Examination-Activity Limitations Bed  Mobility;Bend;Bathing;Carry;Dressing;Locomotion Level;Stairs;Stand    Examination-Participation Restrictions Cleaning;Community Activity;Driving;Laundry;Meal Prep;Occupation    Stability/Clinical Decision Making Evolving/Moderate complexity    Clinical Decision Making Moderate    Rehab Potential Good    PT Frequency 2x / week    PT Duration 4 weeks    PT Treatment/Interventions Manual techniques;Manual lymph drainage;Patient/family education;Therapeutic exercise;Balance training;Neuromuscular re-education    PT Next Visit Plan Continue to progress habituation exercises as tolerated. Sit to supine, knee to nose. Try standing head turns and nods when ready    PT Home Exercise Plan cervical ROM and eye exercises, 12/15/20: VOR           Patient will benefit from skilled therapeutic intervention in order to improve the following deficits and impairments:  Decreased activity tolerance,Decreased balance,Decreased range of motion,Dizziness,Postural dysfunction  Visit Diagnosis: Dizziness and giddiness     Problem List Patient Active Problem List   Diagnosis Date Noted  . Genetic testing 02/21/2017  . Family history of breast cancer   . Cancer [facility]) 12/06/2007   [name], LPTA/CLT; CBIS [phone]  [name] 12/18/2020, 9:20 AM  [facility] [place] [address] [place], [place], [address] Phone: [phone]   Fax:  [phone]  Name: [name] MRN: [id number] Date of Birth: February 11, 1971

## 2020-12-21 ENCOUNTER — Ambulatory Visit (HOSPITAL_COMMUNITY): Payer: BC Managed Care – PPO | Admitting: Physical Therapy

## 2020-12-23 ENCOUNTER — Ambulatory Visit (HOSPITAL_COMMUNITY): Payer: BC Managed Care – PPO | Admitting: Physical Therapy

## 2020-12-23 ENCOUNTER — Telehealth (HOSPITAL_COMMUNITY): Payer: Self-pay | Admitting: Physical Therapy

## 2020-12-23 NOTE — Telephone Encounter (Signed)
pt cancelled appt because she is not feeling well today °

## 2020-12-25 ENCOUNTER — Ambulatory Visit (HOSPITAL_COMMUNITY): Payer: BC Managed Care – PPO

## 2020-12-25 ENCOUNTER — Other Ambulatory Visit: Payer: Self-pay

## 2020-12-25 VITALS — BP 137/85 | HR 85

## 2020-12-25 DIAGNOSIS — R42 Dizziness and giddiness: Secondary | ICD-10-CM | POA: Diagnosis not present

## 2020-12-25 NOTE — Therapy (Signed)
[place], [place], [address] Phone: [phone]   Fax:  [phone]  Physical Therapy Treatment  Patient Details  Name: [name] MRN: [id number] Date of Birth: 03-09-1971 Referring Provider (PT): Leta Baptist   Encounter Date: 12/25/2020   PT End of Session - 12/25/20 1119    Visit Number 4    Number of Visits 8    Date for PT Re-Evaluation 01/09/21    Authorization Type BCBS    Progress Note Due on Visit 8    PT Start Time 1045    PT Stop Time 1125    PT Time Calculation (min) 40 min    Activity Tolerance Patient tolerated treatment well   c/o mild to moderate dizziness, nausea during gaze exercises   Behavior During Therapy [facility] for tasks assessed/performed           Past Medical History:  Diagnosis Date  . BV (bacterial vaginosis)    BV  . Cancer [facility]) 2009   papillary thyroid cancer  . Diabetes mellitus without complication ([place])   . Family history of breast cancer   . IBS (irritable bowel syndrome)     Past Surgical History:  Procedure Laterality Date  . ABLATION    . CARPAL TUNNEL RELEASE    . CESAREAN SECTION    . NECK AND CHEST LESION      Vitals:   12/25/20 1101 12/25/20 1111  BP: (!) 155/79 137/85  Pulse: 90 85     Subjective Assessment - 12/25/20 1101    Subjective Pt reports day 3 of migraine behind Rt eye, reports dizziness every day.  Dizzy scale 5/10 intermittent today.  Reports increased blood pressure medication    Currently in Pain? Yes    Pain Score 5     Pain Location --   dizziness                  Vestibular Assessment - 12/25/20 0001      Oculomotor Exam   Head shaking Horizontal L beating nystagmus   in supine with head rotation to the left     Vestibulo-Ocular Reflex   VOR 1 Head Only (x 1 viewing) double vision with Rt   in supine     Visual Acuity   Static gaze stabilization with head rotation left, right, flexed, extended x 30 sec each  position      Positional Testing   Dix-Hallpike Dix-Hallpike Right;Dix-Hallpike Left      Dix-Hallpike Right   Dix-Hallpike Right Duration 30 seconds    Dix-Hallpike Right Symptoms No nystagmus                     Vestibular Treatment/Exercise - 12/25/20 0001      Vestibular Treatment/Exercise   Gaze Exercises Eye/Head Exercise Horizontal;Eye/Head Exercise Vertical;X2 Viewing Horizontal;X2 Viewing Vertical       EPLEY MANUEVER RIGHT   Number of Reps  1    Response Details  Increased "swimmy headedness" no vertigo, no nystagmus, did note diplopia in supine with head rotated toward LT, resolved when moved from position to sidelying      X2 Viewing Horizontal   Foot Position NBOS      X2 Viewing Vertical   Foot Position NBOS      Eye/Head Exercise Horizontal   Foot Position NBOS      Eye/Head Exercise Vertical   Foot Position NBOS  PT Education - 12/25/20 1127    Education Details Educated importance of gazing habituation exercises, anatomy with semi-circular canal and purpose with Elpey maneuver.    Person(s) Educated Patient    Methods Explanation    Comprehension Verbalized understanding            PT Short Term Goals - 12/10/20 1709      PT SHORT TERM GOAL #1   Title Pt to be dizzy 10 or less times a day    Time 2    Period Weeks    Status New    Target Date 12/24/20      PT SHORT TERM GOAL #2   Title Pt cervical rotation to increase to 55 degrees B to assist in safer driving    Time 2    Period Weeks    Status New             PT Long Term Goals - 12/10/20 1711      PT LONG TERM GOAL #1   Title Pt to be having 5 or less bouts of dizziness a day    Time 4    Period Weeks    Status New    Target Date 01/07/21      PT LONG TERM GOAL #2   Title Pt to be able to turn her head 65 degrees in both directions for safer driving    Time 4    Period Weeks      PT LONG TERM GOAL #3   Title Pt to be I in her HEP and  confident in continuing them on her own until no dizziness    Time 4    Status New      PT LONG TERM GOAL #4   Title PT to be able to single leg stance for 30 seconds B for decrease risk of falling    Time 4    Period Weeks    Status New                 Plan - 12/25/20 1123    Clinical Impression Statement Session began by Pt with vitals assessed seated and supine.  He noted nystalgus Lt horizontal beating in supine position, epley maneuver complete with wiht no nystalgus noted during maneuver.  Continued gazing and habitation exercises, pt reports mild to moderate dizziness with room spinning and nausea.  Dizziness resolved from 20-75" this session.    Personal Factors and Comorbidities Past/Current Experience;Time since onset of injury/illness/exacerbation    Examination-Activity Limitations Bed Mobility;Bend;Bathing;Carry;Dressing;Locomotion Level;Stairs;Stand    Examination-Participation Restrictions Cleaning;Community Activity;Driving;Laundry;Meal Prep;Occupation    Stability/Clinical Decision Making Evolving/Moderate complexity    Clinical Decision Making Moderate    Rehab Potential Good    PT Frequency 2x / week    PT Duration 4 weeks    PT Treatment/Interventions Manual techniques;Manual lymph drainage;Patient/family education;Therapeutic exercise;Balance training;Neuromuscular re-education    PT Next Visit Plan Continue to progress habituation exercises as tolerated. Sit to supine, knee to nose. Try standing head turns and nods when ready    PT Home Exercise Plan cervical ROM and eye exercises, 12/15/20: VOR; 1/21: nose to knee, gaze exercise           Patient will benefit from skilled therapeutic intervention in order to improve the following deficits and impairments:  Decreased activity tolerance,Decreased balance,Decreased range of motion,Dizziness,Postural dysfunction  Visit Diagnosis: Dizziness and giddiness     Problem List Patient Active Problem List    Diagnosis Date Noted  .  Genetic testing 02/21/2017  . Family history of breast cancer   . Cancer [facility]) 12/06/2007   Ihor Austin, LPTA/CLT; CBIS [phone]  Aldona Lento 12/25/2020, 11:40 AM  Gifford Shave, PT, DPT 12/25/2020, 11:40 AM [place] [address] [place], [place], [address] Phone: [phone]   Fax:  [phone]  Name: [name] MRN: [id number] Date of Birth: 06-25-1971

## 2020-12-29 ENCOUNTER — Ambulatory Visit (HOSPITAL_COMMUNITY): Payer: BC Managed Care – PPO | Admitting: Physical Therapy

## 2020-12-29 ENCOUNTER — Telehealth (HOSPITAL_COMMUNITY): Payer: Self-pay | Admitting: Physical Therapy

## 2020-12-29 NOTE — Telephone Encounter (Signed)
pt cancelled appt for this week because she has COVID exposure

## 2020-12-31 ENCOUNTER — Encounter (HOSPITAL_COMMUNITY): Payer: BC Managed Care – PPO

## 2021-01-06 ENCOUNTER — Ambulatory Visit (HOSPITAL_COMMUNITY): Payer: BC Managed Care – PPO | Attending: Otolaryngology | Admitting: Physical Therapy

## 2021-01-06 ENCOUNTER — Other Ambulatory Visit: Payer: Self-pay

## 2021-01-06 ENCOUNTER — Encounter (HOSPITAL_COMMUNITY): Payer: Self-pay | Admitting: Physical Therapy

## 2021-01-06 DIAGNOSIS — R42 Dizziness and giddiness: Secondary | ICD-10-CM | POA: Diagnosis not present

## 2021-01-06 NOTE — Therapy (Signed)
[place], [place], [address] Phone: [phone]   Fax:  [phone]  Physical Therapy Treatment  Patient Details  Name: [name] MRN: [id number] Date of Birth: [birth year] Referring Provider (PT): Leta Baptist   Encounter Date: 01/06/2021   PT End of Session - 01/06/21 1552    Visit Number 5    Number of Visits 8    Date for PT Re-Evaluation 01/09/21    Authorization Type BCBS    Progress Note Due on Visit 8    PT Start Time 1537    PT Stop Time 1607    PT Time Calculation (min) 30 min    Activity Tolerance Patient tolerated treatment well   c/o mild to moderate dizziness, nausea during gaze exercises   Behavior During Therapy [facility] for tasks assessed/performed           Past Medical History:  Diagnosis Date  . BV (bacterial vaginosis)    BV  . Cancer [facility]) 2009   papillary thyroid cancer  . Diabetes mellitus without complication ([place])   . Family history of breast cancer   . IBS (irritable bowel syndrome)     Past Surgical History:  Procedure Laterality Date  . ABLATION    . CARPAL TUNNEL RELEASE    . CESAREAN SECTION    . NECK AND CHEST LESION      There were no vitals filed for this visit.   Subjective Assessment - 01/06/21 1537    Subjective [name] states that she continues to have dizziness everyday; it occurs sit to stand, stand to sit and getting out of bed.    Currently in Pain? No/denies                   Vestibular Assessment - 01/06/21 0001      Dix-Hallpike Right   Dix-Hallpike Right Duration 30 seconds    Dix-Hallpike Right Symptoms No nystagmus;Upbeat, right rotatory nystagmus      Dix-Hallpike Left   Dix-Hallpike Left Symptoms No nystagmus      Positional Sensitivities   Right Hallpike Moderate dizziness    Up from Right Hallpike Moderate dizziness    Up from Left Hallpike Moderate dizziness    Positional Sensitivities Comments Down Lt hallpike minimal  comming up swimmy headed                    [facility] Adult PT Treatment/Exercise - 01/06/21 0001      Manual Therapy   Manual Therapy Soft tissue mobilization    Manual therapy comments efflurage/pettrisage to improve mm spastm    Soft tissue mobilization decrease tightness improve ROM           Vestibular Treatment/Exercise - 01/06/21 0001      Vestibular Treatment/Exercise   Canalith Repositioning Epley Manuever Right       EPLEY MANUEVER RIGHT   Number of Reps  2    Response Details  improved                   PT Short Term Goals - 12/10/20 1709      PT SHORT TERM GOAL #1   Title Pt to be dizzy 10 or less times a day    Time 2    Period Weeks    Status New    Target Date 12/24/20      PT SHORT TERM GOAL #2   Title Pt cervical rotation to increase  to 55 degrees B to assist in safer driving    Time 2    Period Weeks    Status New             PT Long Term Goals - 12/10/20 1711      PT LONG TERM GOAL #1   Title Pt to be having 5 or less bouts of dizziness a day    Time 4    Period Weeks    Status New    Target Date 01/07/21      PT LONG TERM GOAL #2   Title Pt to be able to turn her head 65 degrees in both directions for safer driving    Time 4    Period Weeks      PT LONG TERM GOAL #3   Title Pt to be I in her HEP and confident in continuing them on her own until no dizziness    Time 4    Status New      PT LONG TERM GOAL #4   Title PT to be able to single leg stance for 30 seconds B for decrease risk of falling    Time 4    Period Weeks    Status New                 Plan - 01/06/21 1607    Clinical Impression Statement Completed Eply's x 2 to right followed by soft tissue massage with noted mm  spasms and decreased cervical ROM.  Pt instructed in cervical ROM as well as upper trap stretches    Personal Factors and Comorbidities Past/Current Experience;Time since onset of injury/illness/exacerbation     Examination-Activity Limitations Bed Mobility;Bend;Bathing;Carry;Dressing;Locomotion Level;Stairs;Stand    Examination-Participation Restrictions Cleaning;Community Activity;Driving;Laundry;Meal Prep;Occupation    Stability/Clinical Decision Making Evolving/Moderate complexity    Rehab Potential Good    PT Frequency 2x / week    PT Duration 4 weeks    PT Treatment/Interventions Manual techniques;Manual lymph drainage;Patient/family education;Therapeutic exercise;Balance training;Neuromuscular re-education    PT Next Visit Plan Assess if cervical stretches helps sx of dizziness. Continue to progress habituation exercises as tolerated. Sit to supine, knee to nose. Try standing head turns and nods when ready    PT Home Exercise Plan cervical ROM and eye exercises, 12/15/20: VOR; 1/21: nose to knee, gaze exercise;2/2 upper trap stretch           Patient will benefit from skilled therapeutic intervention in order to improve the following deficits and impairments:  Decreased activity tolerance,Decreased balance,Decreased range of motion,Dizziness,Postural dysfunction  Visit Diagnosis: Dizziness and giddiness     Problem List Patient Active Problem List   Diagnosis Date Noted  . Genetic testing 02/21/2017  . Family history of breast cancer   . Cancer [facility]) 12/06/2007   [name], PT CLT [phone] 01/06/2021, 4:11 PM  [place] [address] [place], [place], [address] Phone: [phone]   Fax:  [phone]  Name: [name] MRN: [id number] Date of Birth: 09/29/1971

## 2021-01-08 ENCOUNTER — Encounter (HOSPITAL_COMMUNITY): Payer: Self-pay | Admitting: Physical Therapy

## 2021-01-08 ENCOUNTER — Ambulatory Visit (HOSPITAL_COMMUNITY): Payer: BC Managed Care – PPO | Admitting: Physical Therapy

## 2021-01-08 ENCOUNTER — Other Ambulatory Visit: Payer: Self-pay

## 2021-01-08 DIAGNOSIS — R42 Dizziness and giddiness: Secondary | ICD-10-CM

## 2021-01-08 NOTE — Therapy (Signed)
[place], [place], [address] Phone: [phone]   Fax:  [phone]  Physical Therapy Treatment  Patient Details  Name: [name] MRN: [id number] Date of Birth: [birth year] Referring Provider (PT): Leta Baptist   Encounter Date: 01/08/2021   PT End of Session - 01/08/21 1224    Visit Number 6    Number of Visits 10    Date for PT Re-Evaluation 01/22/21    Authorization Type BCBS    Progress Note Due on Visit 10    PT Start Time 1123    PT Stop Time 1220    PT Time Calculation (min) 57 min    Activity Tolerance Patient tolerated treatment well   c/o mild to moderate dizziness, nausea during gaze exercises   Behavior During Therapy [facility] for tasks assessed/performed           Past Medical History:  Diagnosis Date  . BV (bacterial vaginosis)    BV  . Cancer [facility]) 2009   papillary thyroid cancer  . Diabetes mellitus without complication ([place])   . Family history of breast cancer   . IBS (irritable bowel syndrome)     Past Surgical History:  Procedure Laterality Date  . ABLATION    . CARPAL TUNNEL RELEASE    . CESAREAN SECTION    . NECK AND CHEST LESION      There were no vitals filed for this visit.   Subjective Assessment - 01/08/21 1140    Subjective Ms. Starke sees the neurologist mid month but does not see Dr. Benjamine Mola until March.    Currently in Pain? No/denies              [facility] PT Assessment - 01/08/21 0001      Assessment   Medical Diagnosis Dizziness    Referring Provider (PT) Su Teoh    Prior Therapy none      Precautions   Precautions None      Restrictions   Weight Bearing Restrictions No      Prior Function   Level of Independence Independent      Cognition   Overall Cognitive Status Within Functional Limits for tasks assessed      Functional Tests   Functional tests Single leg stance      Single Leg Stance   Comments Rt 10: Lt 15   Rt was 0 LT 10     AROM    Cervical - Right Rotation 50   was 45   Cervical - Left Rotation 45   was 30                        [facility] Adult PT Treatment/Exercise - 01/08/21 0001      Manual Therapy   Manual Therapy Soft tissue mobilization    Manual therapy comments efflurage/pettrisage to improve mm spastm    Soft tissue mobilization decrease tightness improve ROM           Vestibular Treatment/Exercise - 01/08/21 0001      Vestibular Treatment/Exercise   Vestibular Treatment Provided Habituation    Habituation Exercises [name];Horizontal Roll      [name]   Number of Reps  5    Symptom Description  scale of 1-10 8 dizziness; rested in between      Horizontal Roll   Number of Reps  5    Symptom Description  LT 1.5; RT  Balance Exercises - 01/08/21 0001      Balance Exercises: Standing   Tandem Stance Eyes open;5 reps;Other reps (comment)   head turns   Tandem Gait Forward;2 reps    Retro Gait 2 reps             PT Education - 01/08/21 1222    Education Details Brandt=Daroff; tandem stance with  head turns    Person(s) Educated Patient    Methods Explanation;Demonstration;Handout    Comprehension Verbalized understanding;Returned demonstration            PT Short Term Goals - 01/08/21 1228      PT SHORT TERM GOAL #1   Title Pt to be dizzy 10 or less times a day    Time 2    Period Weeks    Status On-going    Target Date 12/24/20      PT SHORT TERM GOAL #2   Title Pt cervical rotation to increase to 55 degrees B to assist in safer driving    Time 2    Period Weeks    Status On-going             PT Long Term Goals - 01/08/21 1229      PT LONG TERM GOAL #1   Title Pt to be having 5 or less bouts of dizziness a day    Time 4    Period Weeks    Status On-going      PT LONG TERM GOAL #2   Title Pt to be able to turn her head 65 degrees in both directions for safer driving    Time 4    Period Weeks    Status On-going       PT LONG TERM GOAL #3   Title Pt to be I in her HEP and confident in continuing them on her own until no dizziness    Time 4    Status On-going      PT LONG TERM GOAL #4   Title PT to be able to single leg stance for 30 seconds B for decrease risk of falling    Time 4    Period Weeks    Status On-going                 Plan - 01/08/21 1225    Clinical Impression Statement PT not respomding ot Eply's;  Pt has 8/10 dizziness with Brandt-Daroff manuever.  Treatment focussed on improving balance as well as manual to improve cervical motion and habituation exercises.  Pt unable to come to several appointments due to weather recommend that pt continues 2x a week for the next 2 weeks to maximize outcomes.    Personal Factors and Comorbidities Past/Current Experience;Time since onset of injury/illness/exacerbation    Examination-Activity Limitations Bed Mobility;Bend;Bathing;Carry;Dressing;Locomotion Level;Stairs;Stand    Examination-Participation Restrictions Cleaning;Community Activity;Driving;Laundry;Meal Prep;Occupation    Stability/Clinical Decision Making Evolving/Moderate complexity    Rehab Potential Good    PT Frequency 2x / week    PT Duration 4 weeks    PT Treatment/Interventions Manual techniques;Manual lymph drainage;Patient/family education;Therapeutic exercise;Balance training;Neuromuscular re-education    PT Next Visit Plan Continue to progress habituation exercises as tolerated.    PT Home Exercise Plan cervical ROM and eye exercises, 12/15/20: VOR; 1/21: nose to knee, gaze exercise;2/2 upper trap stretch; 2/4: Brandt-Daroof; tandem stance with head turns.           Patient will benefit from skilled therapeutic intervention in order to improve the following  deficits and impairments:  Decreased activity tolerance,Decreased balance,Decreased range of motion,Dizziness,Postural dysfunction  Visit Diagnosis: Dizziness and giddiness - Plan: PT plan of care  cert/re-cert     Problem List Patient Active Problem List   Diagnosis Date Noted  . Genetic testing 02/21/2017  . Family history of breast cancer   . Cancer [facility]) 12/06/2007    [name], PT CLT [phone] 01/08/2021, 12:31 PM  [place] [address] [place], [place], [address] Phone: [phone]   Fax:  [phone]  Name: ANNALIS KACZMARCZYK MRN: [id number] Date of Birth: 11/28/1971

## 2021-01-11 ENCOUNTER — Encounter (HOSPITAL_COMMUNITY): Payer: BC Managed Care – PPO | Admitting: Physical Therapy

## 2021-01-13 ENCOUNTER — Ambulatory Visit (HOSPITAL_COMMUNITY): Payer: BC Managed Care – PPO

## 2021-01-14 DIAGNOSIS — G609 Hereditary and idiopathic neuropathy, unspecified: Secondary | ICD-10-CM | POA: Insufficient documentation

## 2021-01-14 DIAGNOSIS — R42 Dizziness and giddiness: Secondary | ICD-10-CM | POA: Diagnosis not present

## 2021-01-14 DIAGNOSIS — G43909 Migraine, unspecified, not intractable, without status migrainosus: Secondary | ICD-10-CM | POA: Diagnosis not present

## 2021-01-14 DIAGNOSIS — R269 Unspecified abnormalities of gait and mobility: Secondary | ICD-10-CM | POA: Diagnosis not present

## 2021-01-20 ENCOUNTER — Other Ambulatory Visit: Payer: Self-pay

## 2021-01-20 ENCOUNTER — Encounter (HOSPITAL_COMMUNITY): Payer: Self-pay

## 2021-01-20 ENCOUNTER — Ambulatory Visit (HOSPITAL_COMMUNITY): Payer: BC Managed Care – PPO

## 2021-01-20 DIAGNOSIS — R42 Dizziness and giddiness: Secondary | ICD-10-CM

## 2021-01-20 NOTE — Therapy (Signed)
[place], [place], [address] Phone: [phone]   Fax:  [phone]  Physical Therapy Treatment  Patient Details  Name: TACORA ATHANAS MRN: [id number] Date of Birth: 27-Apr-1971 Referring Provider (PT): Leta Baptist   Encounter Date: 01/20/2021   PT End of Session - 01/20/21 1541    Visit Number 7    Number of Visits 10    Date for PT Re-Evaluation 01/22/21    Authorization Type BCBS    Progress Note Due on Visit 10    PT Start Time 1534    PT Stop Time 1613    PT Time Calculation (min) 39 min    Activity Tolerance Patient tolerated treatment well    Behavior During Therapy [facility] for tasks assessed/performed           Past Medical History:  Diagnosis Date  . BV (bacterial vaginosis)    BV  . Cancer [facility]) 2009   papillary thyroid cancer  . Diabetes mellitus without complication ([place])   . Family history of breast cancer   . IBS (irritable bowel syndrome)     Past Surgical History:  Procedure Laterality Date  . ABLATION    . CARPAL TUNNEL RELEASE    . CESAREAN SECTION    . NECK AND CHEST LESION      There were no vitals filed for this visit.   Subjective Assessment - 01/20/21 1536    Subjective Pt stated she got her hair done yesterday and increased neuropathy feet and hands following sitting for 3 hours, reoprts increased edema.  Reports dizziness scale 8/10.    Currently in Pain? Yes    Pain Score 8     Pain Location --   hands and feet 10/10 pins and needles, edema, tingling, pain; dizziness 8/10 world is spinning and floating   Pain Frequency Constant                             [facility] Adult PT Treatment/Exercise - 01/20/21 0001      Manual Therapy   Manual Therapy Soft tissue mobilization    Manual therapy comments Manual complete separate than rest of tx: efflurage/pettrisage to improve mm spastm    Soft tissue mobilization decrease tightness improve ROM            Vestibular Treatment/Exercise - 01/20/21 0001      Vestibular Treatment/Exercise   Vestibular Treatment Provided Habituation    Habituation Exercises Brandt Daroff   90 degree turns 5x each dizzy scale 8/10     Nestor Lewandowsky   Number of Reps  5    Symptom Description  scale of 1-10 8-9 dizziness; rested in between              Balance Exercises - 01/20/21 0001      Balance Exercises: Standing   Sit to Stand Other (comment)   STS following laser 10x 2 sets   Other Standing Exercises Comments Habituation 90 degree turns 5x each; dizzy scale 8/10             PT Education - 01/20/21 1548    Education Details Pt educated on benefits with compression garments for edema/pain control with measurements and paperwork given.    Person(s) Educated Patient    Methods Explanation;Handout    Comprehension Verbalized understanding;Returned demonstration            PT Short Term Goals -  01/08/21 1228      PT SHORT TERM GOAL #1   Title Pt to be dizzy 10 or less times a day    Time 2    Period Weeks    Status On-going    Target Date 12/24/20      PT SHORT TERM GOAL #2   Title Pt cervical rotation to increase to 55 degrees B to assist in safer driving    Time 2    Period Weeks    Status On-going             PT Long Term Goals - 01/08/21 1229      PT LONG TERM GOAL #1   Title Pt to be having 5 or less bouts of dizziness a day    Time 4    Period Weeks    Status On-going      PT LONG TERM GOAL #2   Title Pt to be able to turn her head 65 degrees in both directions for safer driving    Time 4    Period Weeks    Status On-going      PT LONG TERM GOAL #3   Title Pt to be I in her HEP and confident in continuing them on her own until no dizziness    Time 4    Status On-going      PT LONG TERM GOAL #4   Title PT to be able to single leg stance for 30 seconds B for decrease risk of falling    Time 4    Period Weeks    Status On-going                 Plan  - 01/20/21 1823    Clinical Impression Statement Pt limited by pain with standing today, noted edema present.  Educated on benefits wiht compression garments to assist with edema/pain, measurements taken and paperwork given for [place] to standing habituation exercises.  Dizziness levels stayed between 8-9/10.  Pt demonstrates depenence on chair back of leg for stability with new standing exercises.    Personal Factors and Comorbidities Past/Current Experience;Time since onset of injury/illness/exacerbation    Examination-Activity Limitations Bed Mobility;Bend;Bathing;Carry;Dressing;Locomotion Level;Stairs;Stand    Examination-Participation Restrictions Cleaning;Community Activity;Driving;Laundry;Meal Prep;Occupation    Stability/Clinical Decision Making Evolving/Moderate complexity    Clinical Decision Making Moderate    Rehab Potential Good    PT Frequency 2x / week    PT Duration 4 weeks    PT Treatment/Interventions Manual techniques;Manual lymph drainage;Patient/family education;Therapeutic exercise;Balance training;Neuromuscular re-education    PT Next Visit Plan Continue to progress habituation exercises as tolerated.    PT Home Exercise Plan cervical ROM and eye exercises, 12/15/20: VOR; 1/21: nose to knee, gaze exercise;2/2 upper trap stretch; 2/4: Brandt-Daroof; tandem stance with head turns.           Patient will benefit from skilled therapeutic intervention in order to improve the following deficits and impairments:  Decreased activity tolerance,Decreased balance,Decreased range of motion,Dizziness,Postural dysfunction  Visit Diagnosis: Dizziness and giddiness     Problem List Patient Active Problem List   Diagnosis Date Noted  . Genetic testing 02/21/2017  . Family history of breast cancer   . Cancer [facility]) 12/06/2007   Ihor Austin, LPTA/CLT; CBIS [phone]  Aldona Lento 01/20/2021, 6:34 PM  [place] [address] [place], [place], [address] Phone: [phone]   Fax:  [phone]  Name: ZULEIKA GALLUS MRN:  [id number] Date of Birth: Aug 11, 1971

## 2021-01-21 DIAGNOSIS — E1142 Type 2 diabetes mellitus with diabetic polyneuropathy: Secondary | ICD-10-CM | POA: Diagnosis not present

## 2021-01-27 ENCOUNTER — Ambulatory Visit (HOSPITAL_COMMUNITY): Payer: BC Managed Care – PPO

## 2021-01-27 ENCOUNTER — Other Ambulatory Visit: Payer: Self-pay

## 2021-01-27 DIAGNOSIS — R42 Dizziness and giddiness: Secondary | ICD-10-CM

## 2021-01-27 NOTE — Therapy (Signed)
[facility] [address] [place], [place], [address] Phone: [phone]   Fax:  [phone]  Physical Therapy Treatment and Recertification and Progress Note  Patient Details  Name: [name] MRN: [id number] Date of Birth: 05-Jan-1971 Referring Provider (PT): Kasandra Knudsen Teoh   Progress Note Reporting Period 01/08/21 to 01/27/21  See note below for Objective Data and Assessment of Progress/Goals.      Encounter Date: 01/27/2021   PT End of Session - 01/27/21 1557    Visit Number 8    Number of Visits 16    Date for PT Re-Evaluation 02/24/21    Authorization Type BCBS, requesting extension x 8 visits with recertification performed 01/27/21    Progress Note Due on Visit 18    PT Start Time 1555    PT Stop Time 1640    PT Time Calculation (min) 45 min    Activity Tolerance Patient tolerated treatment well    Behavior During Therapy [facility] for tasks assessed/performed           Past Medical History:  Diagnosis Date  . BV (bacterial vaginosis)    BV  . Cancer [facility]) 2009   papillary thyroid cancer  . Diabetes mellitus without complication ([facility])   . Family history of breast cancer   . IBS (irritable bowel syndrome)     Past Surgical History:  Procedure Laterality Date  . ABLATION    . CARPAL TUNNEL RELEASE    . CESAREAN SECTION    . NECK AND CHEST LESION      There were no vitals filed for this visit.   Subjective Assessment - 01/27/21 1558    Subjective Reports continued dizziness and visual distrubances with arising from laying down and also when standing up from sitting position. Reports continued episodes throughout the day and with position change.  Recent neurologist appointment which confirmed BLE polyneuropathy    Currently in Pain? Yes    Pain Score 6     Pain Location Neck    Pain Orientation Left    Pain Descriptors / Indicators Aching    Pain Type Chronic pain    Aggravating Factors  head/neck rotation left               [facility] PT Assessment - 01/27/21 0001      Assessment   Medical Diagnosis Dizziness    Referring Provider (PT) Su Teoh      Functional Tests   Functional tests Single leg stance      Single Leg Stance   Comments Rt: 12 sec; Lt 17 sec      AROM   Cervical - Right Rotation 60    Cervical - Left Rotation 45      Ambulation/Gait   Stairs Yes    Stairs Assistance 6: Modified independent (Device/Increase time)    Stair Management Technique No rails    Height of Stairs 6    Gait Comments Two-Minute Step Test: 26 steps   HR at baseline 78 bpm, HR after test 88 bpm     Standardized Balance Assessment   Standardized Balance Assessment Dynamic Gait Index      Dynamic Gait Index   Level Surface Normal    Change in Gait Speed Mild Impairment    Gait with Horizontal Head Turns Normal    Gait with Vertical Head Turns Mild Impairment    Gait and Pivot Turn Mild Impairment    Step Over Obstacle Moderate Impairment  Step Around Obstacles Mild Impairment    Steps Mild Impairment    Total Score 17                          Vestibular Treatment/Exercise - 01/27/21 0001      Vestibular Treatment/Exercise   Vestibular Treatment Provided Habituation    Habituation Exercises Legrand Como Daroff   Number of Reps  5    Symptom Description  scale of 1-10 8-9 dizziness; rested in between      Horizontal Roll   Number of Reps  5                 PT Education - 01/27/21 1653    Education Details Education on STG/LTG and lack of progress with her instances of dizziness and vertigo.  Extensive discussion regarding general health and importance of lifestyle modifications in regards to DM and its impact on body systems and balance.  Education/discussion regarding peripheral neuropathy and balance disturbance    Person(s) Educated Patient    Methods Explanation    Comprehension Verbalized understanding            PT Short Term Goals -  01/27/21 1608      PT SHORT TERM GOAL #1   Title Pt to be dizzy 10 or less times a day    Baseline between 20-30 episodes a day    Time 2    Period Weeks    Status Not Met    Target Date 01/27/21      PT SHORT TERM GOAL #2   Title Pt cervical rotation to increase to 55 degrees B to assist in safer driving    Baseline 45 degrees left rotation, 60 degrees right rotation    Time 2    Period Weeks    Status On-going    Target Date 02/10/21      PT SHORT TERM GOAL #3   Title Patient will decrease risk for falls as evidenced by score of 20/22 Dynamic Gait Index    Baseline 17/22 indicating moderate risk for falls    Time 2    Period Weeks    Status New    Target Date 02/10/21      PT SHORT TERM GOAL #4   Title Demonstrate independence with LE strengthening HEP to reduce risk for falls    Time 2    Period Weeks    Status New    Target Date 02/10/21             PT Long Term Goals - 01/27/21 1703      PT LONG TERM GOAL #1   Title Pt to be having 5 or less bouts of dizziness a day    Baseline 20-30 episodes    Time 4    Period Weeks    Status On-going    Target Date 02/24/21      PT LONG TERM GOAL #2   Title Pt to be able to turn her head 65 degrees in both directions for safer driving    Baseline 45 degrees left rotation, 60 degrees right    Time 4    Period Weeks    Status On-going    Target Date 02/24/21      PT LONG TERM GOAL #3   Title Pt to be I in her HEP and confident in continuing them on her own until no dizziness  Baseline pt reports independent daily performance of habituation exercises    Time 4    Status Achieved      PT LONG TERM GOAL #4   Title PT to be able to single leg stance for 30 seconds B for decrease risk of falling    Baseline Right: 12 sec; left 17 sec    Time 4    Period Weeks    Status On-going    Target Date 02/24/21                 Plan - 01/27/21 1705    Clinical Impression Statement Patient subjective report  indicates no improvement in dizziness episodes which she reports occur every time she rolls in bed or arises from supine to sitting or sitting to standing.  Demonstrates moderate risk for falls per DGI score and demo decreased exercise tolerance as manifest by completing 26 steps during Two Minute Step Test.  Decreased stance control also appreciated during single limb stance with poor control/stability requiring external support to correct LOB.  Patient would benefit from continued tx sessions to focus on developing comprehensive strengthening program that she could safely execute at home despite dizzy episodes    Personal Factors and Comorbidities Past/Current Experience;Time since onset of injury/illness/exacerbation    Examination-Activity Limitations Bed Mobility;Bend;Bathing;Carry;Dressing;Locomotion Level;Stairs;Stand    Examination-Participation Restrictions Cleaning;Community Activity;Driving;Laundry;Meal Prep;Occupation    Stability/Clinical Decision Making Evolving/Moderate complexity    Rehab Potential Good    PT Frequency 2x / week    PT Duration 4 weeks    PT Treatment/Interventions Manual techniques;Manual lymph drainage;Patient/family education;Therapeutic exercise;Balance training;Neuromuscular re-education    PT Next Visit Plan Assess if she can recall habituation exercises.  Develop HEP for strengthening program that she can perform safely from supine/seated/supported standing to accommodate dizziness    PT Home Exercise Plan cervical ROM and eye exercises, 12/15/20: VOR; 1/21: nose to knee, gaze exercise;2/2 upper trap stretch; 2/4: Brandt-Daroof; tandem stance with head turns.    Consulted and Agree with Plan of Care Patient           Patient will benefit from skilled therapeutic intervention in order to improve the following deficits and impairments:  Decreased activity tolerance,Decreased balance,Decreased range of motion,Dizziness,Postural dysfunction  Visit  Diagnosis: Dizziness and giddiness     Problem List Patient Active Problem List   Diagnosis Date Noted  . Genetic testing 02/21/2017  . Family history of breast cancer   . Cancer ([place]) 12/06/2007   5:16 PM, 01/27/21 M. Sherlyn Lees, PT, DPT Physical Therapist- [place] Office Number: [phone]  [facility] [address] [place], [place], [address] Phone: [phone]   Fax:  [phone]  Name: RIONNA FELTES MRN: [id number] Date of Birth: 08-Apr-1971

## 2021-01-29 ENCOUNTER — Encounter (HOSPITAL_COMMUNITY): Payer: Self-pay

## 2021-01-29 ENCOUNTER — Ambulatory Visit (HOSPITAL_COMMUNITY): Payer: BC Managed Care – PPO

## 2021-01-29 ENCOUNTER — Other Ambulatory Visit: Payer: Self-pay

## 2021-01-29 DIAGNOSIS — R42 Dizziness and giddiness: Secondary | ICD-10-CM

## 2021-01-29 NOTE — Therapy (Signed)
[facility] [address] [place], [place], [address] Phone: [phone]   Fax:  [phone]  Physical Therapy Treatment  Patient Details  Name: [name] MRN: [id number] Date of Birth: 03-Jun-1971 Referring Provider (PT): Newman Pies   Encounter Date: 01/29/2021   PT End of Session - 01/29/21 1409    Visit Number 9    Number of Visits 16    Date for PT Re-Evaluation 02/24/21    Authorization Type BCBS, requesting extension x 8 visits with recertification performed 01/27/21    Progress Note Due on Visit 18    PT Start Time 1402    PT Stop Time 1443    PT Time Calculation (min) 41 min    Activity Tolerance Patient tolerated treatment well    Behavior During Therapy [facility] for tasks assessed/performed           Past Medical History:  Diagnosis Date  . BV (bacterial vaginosis)    BV  . Cancer [facility]) 2009   papillary thyroid cancer  . Diabetes mellitus without complication ([facility])   . Family history of breast cancer   . IBS (irritable bowel syndrome)     Past Surgical History:  Procedure Laterality Date  . ABLATION    . CARPAL TUNNEL RELEASE    . CESAREAN SECTION    . NECK AND CHEST LESION      There were no vitals filed for this visit.   Subjective Assessment - 01/29/21 1405    Subjective Reports dizziness continues, progressing well.  Reports she has been adding strengthening exercises and has been watching her diet.  Reports blood sugar was 145.    Currently in Pain? Yes    Pain Score 6     Pain Location Neck   dizziness, upper back and neuropathy   Pain Descriptors / Indicators Aching                             [facility] Adult PT Treatment/Exercise - 01/29/21 0001      Ambulation/Gait   Stairs Yes    Stairs Assistance 5: Supervision    Stair Management Technique Alternating pattern;One rail Right    Number of Stairs 4    Height of Stairs 7   3 times, reports of dizziness 6 to 8/10 descending stairs      Neck Exercises: Standing   Other Standing Exercises Paloff 4x10 with RTB partial tandem stance      Neck Exercises: Seated   Other Seated Exercise 3D cervical excursion 10x    Other Seated Exercise 10x STS      Neck Exercises: Supine   Other Supine Exercise Bridge 15x5"      Manual Therapy   Manual Therapy Soft tissue mobilization;Passive ROM    Manual therapy comments Manual complete separate than rest of tx: efflurage/pettrisage to improve mm spastm    Soft tissue mobilization decrease tightness improve ROM    Passive ROM Cervical PROM pain free range           Vestibular Treatment/Exercise - 01/29/21 0001      Vestibular Treatment/Exercise   Vestibular Treatment Provided Habituation    Habituation Exercises Brandt Daroff;Comment   Standing then pivot 90 degrees.                  PT Short Term Goals - 01/27/21 1608      PT SHORT TERM GOAL #1   Title  Pt to be dizzy 10 or less times a day    Baseline between 20-30 episodes a day    Time 2    Period Weeks    Status Not Met    Target Date 01/27/21      PT SHORT TERM GOAL #2   Title Pt cervical rotation to increase to 55 degrees B to assist in safer driving    Baseline 45 degrees left rotation, 60 degrees right rotation    Time 2    Period Weeks    Status On-going    Target Date 02/10/21      PT SHORT TERM GOAL #3   Title Patient will decrease risk for falls as evidenced by score of 20/22 Dynamic Gait Index    Baseline 17/22 indicating moderate risk for falls    Time 2    Period Weeks    Status New    Target Date 02/10/21      PT SHORT TERM GOAL #4   Title Demonstrate independence with LE strengthening HEP to reduce risk for falls    Time 2    Period Weeks    Status New    Target Date 02/10/21             PT Long Term Goals - 01/27/21 1703      PT LONG TERM GOAL #1   Title Pt to be having 5 or less bouts of dizziness a day    Baseline 20-30 episodes    Time 4    Period Weeks    Status  On-going    Target Date 02/24/21      PT LONG TERM GOAL #2   Title Pt to be able to turn her head 65 degrees in both directions for safer driving    Baseline 45 degrees left rotation, 60 degrees right    Time 4    Period Weeks    Status On-going    Target Date 02/24/21      PT LONG TERM GOAL #3   Title Pt to be I in her HEP and confident in continuing them on her own until no dizziness    Baseline pt reports independent daily performance of habituation exercises    Time 4    Status Achieved      PT LONG TERM GOAL #4   Title PT to be able to single leg stance for 30 seconds B for decrease risk of falling    Baseline Right: 12 sec; left 17 sec    Time 4    Period Weeks    Status On-going    Target Date 02/24/21                 Plan - 01/29/21 1418    Clinical Impression Statement Reviewed current habituation exercises, pt able to recall and demonstrate appropriate mechanics.  Added cervical mobility and LE strengthening exercises to assist with functional activities.  Pt continues to be limited with dizziness with reports of increased dizziness descending stairs, pt able to demonstrate safe mechanics with stairs and encouraged to use handrail for assistance.    Personal Factors and Comorbidities Past/Current Experience;Time since onset of injury/illness/exacerbation    Examination-Activity Limitations Bed Mobility;Bend;Bathing;Carry;Dressing;Locomotion Level;Stairs;Stand    Examination-Participation Restrictions Cleaning;Community Activity;Driving;Laundry;Meal Prep;Occupation    Stability/Clinical Decision Making Evolving/Moderate complexity    Clinical Decision Making Moderate    Rehab Potential Good    PT Frequency 2x / week    PT Duration 4 weeks  PT Treatment/Interventions Manual techniques;Manual lymph drainage;Patient/family education;Therapeutic exercise;Balance training;Neuromuscular re-education    PT Next Visit Plan Pt forgot printout for additional HEP, give  copy next session (3D hip excusion, STS, bridges, stairs with HR assistance).  Assess if she can recall habituation exercises.  Develop HEP for strengthening program that she can perform safely from supine/seated/supported standing to accommodate dizziness    PT Home Exercise Plan cervical ROM and eye exercises, 12/15/20: VOR; 1/21: nose to knee, gaze exercise;2/2 upper trap stretch; 2/4: Brandt-Daroof; tandem stance with head turns.    Consulted and Agree with Plan of Care Patient           Patient will benefit from skilled therapeutic intervention in order to improve the following deficits and impairments:  Decreased activity tolerance,Decreased balance,Decreased range of motion,Dizziness,Postural dysfunction  Visit Diagnosis: Dizziness and giddiness     Problem List Patient Active Problem List   Diagnosis Date Noted  . Genetic testing 02/21/2017  . Family history of breast cancer   . Cancer [facility]) 12/06/2007   Ihor Austin, LPTA/CLT; CBIS [phone]  Aldona Lento 01/29/2021, 5:27 PM  [facility] [address] [place], [place], [address] Phone: [phone]   Fax:  [phone]  Name: SHAYLEIGH BOULDIN MRN: [id number] Date of Birth: Sep 15, 1971

## 2021-01-29 NOTE — Patient Instructions (Signed)
Functional Quadriceps: Sit to Stand    Sit on edge of chair, feet flat on floor. Stand upright, extending knees fully. Repeat ____ times per set. Do ____ sets per session. Do ____ sessions per day.  http://orth.exer.us/735   Copyright  VHI. All rights reserved.  Bridge    Lie back, legs bent. Inhale, pressing hips up. Keeping ribs in, lengthen lower back. Exhale, rolling down along spine from top. Repeat ____ times. Do ____ sessions per day.  http://pm.exer.us/55   Copyright  VHI. All rights reserved.

## 2021-02-03 ENCOUNTER — Ambulatory Visit (HOSPITAL_COMMUNITY): Payer: BC Managed Care – PPO | Admitting: Physical Therapy

## 2021-02-09 ENCOUNTER — Other Ambulatory Visit: Payer: Self-pay

## 2021-02-09 ENCOUNTER — Ambulatory Visit (HOSPITAL_COMMUNITY): Payer: BC Managed Care – PPO | Attending: Otolaryngology

## 2021-02-09 ENCOUNTER — Encounter (HOSPITAL_COMMUNITY): Payer: Self-pay

## 2021-02-09 DIAGNOSIS — R42 Dizziness and giddiness: Secondary | ICD-10-CM | POA: Insufficient documentation

## 2021-02-09 NOTE — Therapy (Signed)
[facility] [place], [place], [address] Phone: [phone]   Fax:  [phone]  Physical Therapy Treatment  Patient Details  Name: [name] MRN: [id number] Date of Birth: August 28, 1971 Referring Provider (PT): Leta Baptist   Encounter Date: 02/09/2021   PT End of Session - 02/09/21 0915    Visit Number 10    Number of Visits 16    Date for PT Re-Evaluation 02/24/21    Authorization Type BCBS, requesting extension x 8 visits with recertification performed 01/27/21    Progress Note Due on Visit 18    PT Start Time 0831    PT Stop Time 0910    PT Time Calculation (min) 39 min    Activity Tolerance Patient tolerated treatment well    Behavior During Therapy [facility] for tasks assessed/performed           Past Medical History:  Diagnosis Date  . BV (bacterial vaginosis)    BV  . Cancer [facility]) 2009   papillary thyroid cancer  . Diabetes mellitus without complication ([place])   . Family history of breast cancer   . IBS (irritable bowel syndrome)     Past Surgical History:  Procedure Laterality Date  . ABLATION    . CARPAL TUNNEL RELEASE    . CESAREAN SECTION    . NECK AND CHEST LESION      There were no vitals filed for this visit.   Subjective Assessment - 02/09/21 0836    Subjective Pt reports her dizziness episodes are reducing frequency and intensity, did have a bad spell yesterday.  Reports blood sugars stays in 140s.    Currently in Pain? Yes    Pain Score 7     Pain Location Foot   neuropathy   Pain Orientation Right;Left    Pain Descriptors / Indicators Tingling;Numbness   neuropathy   Pain Type Chronic pain    Pain Onset More than a month ago    Pain Frequency Constant    Aggravating Factors  standing for long periods of time    Pain Relieving Factors elevation    Effect of Pain on Daily Activities limits                             [facility] Adult PT Treatment/Exercise - 02/09/21 0001       Neck Exercises: Standing   Other Standing Exercises Paloff 4x10 with RTB partial tandem stance    Other Standing Exercises 3D hip excursion (squat/extend; lateral weight shift, rotate)               Balance Exercises - 02/09/21 0001      Balance Exercises: Standing   Tandem Stance 4 reps   paloff GTB tandem stance   Tandem Gait Forward;2 reps    Retro Gait 2 reps   retro tandem gait   Sidestepping Theraband   2RT wiht RTB around thigh              PT Short Term Goals - 01/27/21 1608      PT SHORT TERM GOAL #1   Title Pt to be dizzy 10 or less times a day    Baseline between 20-30 episodes a day    Time 2    Period Weeks    Status Not Met    Target Date 01/27/21      PT SHORT TERM GOAL #2   Title Pt  cervical rotation to increase to 55 degrees B to assist in safer driving    Baseline 45 degrees left rotation, 60 degrees right rotation    Time 2    Period Weeks    Status On-going    Target Date 02/10/21      PT SHORT TERM GOAL #3   Title Patient will decrease risk for falls as evidenced by score of 20/22 Dynamic Gait Index    Baseline 17/22 indicating moderate risk for falls    Time 2    Period Weeks    Status New    Target Date 02/10/21      PT SHORT TERM GOAL #4   Title Demonstrate independence with LE strengthening HEP to reduce risk for falls    Time 2    Period Weeks    Status New    Target Date 02/10/21             PT Long Term Goals - 01/27/21 1703      PT LONG TERM GOAL #1   Title Pt to be having 5 or less bouts of dizziness a day    Baseline 20-30 episodes    Time 4    Period Weeks    Status On-going    Target Date 02/24/21      PT LONG TERM GOAL #2   Title Pt to be able to turn her head 65 degrees in both directions for safer driving    Baseline 45 degrees left rotation, 60 degrees right    Time 4    Period Weeks    Status On-going    Target Date 02/24/21      PT LONG TERM GOAL #3   Title Pt to be I in her HEP and confident in  continuing them on her own until no dizziness    Baseline pt reports independent daily performance of habituation exercises    Time 4    Status Achieved      PT LONG TERM GOAL #4   Title PT to be able to single leg stance for 30 seconds B for decrease risk of falling    Baseline Right: 12 sec; left 17 sec    Time 4    Period Weeks    Status On-going    Target Date 02/24/21                 Plan - 02/09/21 0915    Clinical Impression Statement Session focus wiht dynamic balance and functional strengthening.  Pt continues to be limited wiht dizziness lasting between 20-45", able to resolve with habitution exercises independently.  Did required min A with dynamic balance activtiies for safety.  Addition exercises given for HEP for functional strengthening, able to demonstrate and recall wihtout cueing required.    Personal Factors and Comorbidities Past/Current Experience;Time since onset of injury/illness/exacerbation    Examination-Activity Limitations Bed Mobility;Bend;Bathing;Carry;Dressing;Locomotion Level;Stairs;Stand    Examination-Participation Restrictions Cleaning;Community Activity;Driving;Laundry;Meal Prep;Occupation    Stability/Clinical Decision Making Evolving/Moderate complexity    Clinical Decision Making Moderate    Rehab Potential Good    PT Frequency 2x / week    PT Duration 4 weeks    PT Treatment/Interventions Manual techniques;Manual lymph drainage;Patient/family education;Therapeutic exercise;Balance training;Neuromuscular re-education    PT Next Visit Plan Add warrior poses, lunges and leg press.    PT Home Exercise Plan cervical ROM and eye exercises, 12/15/20: VOR; 1/21: nose to knee, gaze exercise;2/2 upper trap stretch; 2/4: Brandt-Daroof; tandem stance with head turns.  Consulted and Agree with Plan of Care Patient           Patient will benefit from skilled therapeutic intervention in order to improve the following deficits and impairments:   Decreased activity tolerance,Decreased balance,Decreased range of motion,Dizziness,Postural dysfunction  Visit Diagnosis: Dizziness and giddiness     Problem List Patient Active Problem List   Diagnosis Date Noted  . Genetic testing 02/21/2017  . Family history of breast cancer   . Cancer [facility]) 12/06/2007   Ihor Austin, LPTA/CLT; CBIS [phone] Aldona Lento 02/09/2021, 12:33 PM  [facility] [address] [place], [place], [address] Phone: [phone]   Fax:  [phone]  Name: [name] MRN: [id number] Date of Birth: January 29, 1971

## 2021-02-09 NOTE — Patient Instructions (Addendum)
Bridge    Lie back, legs bent. Inhale, pressing hips up. Keeping ribs in, lengthen lower back. Exhale, rolling down along spine from top. Repeat 10 times. Do 2 sessions per day.  http://pm.exer.us/55   Copyright  VHI. All rights reserved.   Functional Quadriceps: Sit to Stand    Sit on edge of chair, feet flat on floor. Stand upright, extending knees fully. Repeat 10 times per set. Do 2 sets per session. Do ____ sessions per day.  http://orth.exer.us/735   Copyright  VHI. All rights reserved.  Climbing Stairs    When climbing stairs, breathe in with first step, breathe out through pursed lips with two or three steps. If you are more short of breath than usual: Breathe in while standing still. Breathe out through pursed lips while stepping up. Stop, then repeat with each step. Take only one step at a time. Do not hold your breath when climbing steps.  Copyright  VHI. All rights reserved.   Band Walk: Side Stepping    Tie band around legs, just above knees. Step 15 feet to one side, then step back to start. Repeat ___ feet per session. Note: Small towel between band and skin eases rubbing.  http://plyo.exer.us/76   Copyright  VHI. All rights reserved.

## 2021-02-12 ENCOUNTER — Ambulatory Visit (HOSPITAL_COMMUNITY): Payer: BC Managed Care – PPO | Admitting: Physical Therapy

## 2021-02-12 ENCOUNTER — Other Ambulatory Visit: Payer: Self-pay

## 2021-02-12 ENCOUNTER — Encounter (HOSPITAL_COMMUNITY): Payer: Self-pay | Admitting: Physical Therapy

## 2021-02-12 DIAGNOSIS — R42 Dizziness and giddiness: Secondary | ICD-10-CM | POA: Diagnosis not present

## 2021-02-12 NOTE — Therapy (Signed)
[place], [place], [address] Phone: [phone]   Fax:  [phone]  Physical Therapy Treatment  Patient Details  Name: [name] MRN: [id number] Date of Birth: [birth year] Referring Provider (PT): Leta Baptist   Encounter Date: 02/12/2021   PT End of Session - 02/12/21 1047    Visit Number 11    Number of Visits 16    Date for PT Re-Evaluation 02/24/21    Authorization Type BCBS, requesting extension x 8 visits with recertification performed 01/27/21    Progress Note Due on Visit 18    PT Start Time 1048    PT Stop Time 1126    PT Time Calculation (min) 38 min    Activity Tolerance Patient tolerated treatment well    Behavior During Therapy [facility] for tasks assessed/performed           Past Medical History:  Diagnosis Date  . BV (bacterial vaginosis)    BV  . Cancer [facility]) 2009   papillary thyroid cancer  . Diabetes mellitus without complication ([place])   . Family history of breast cancer   . IBS (irritable bowel syndrome)     Past Surgical History:  Procedure Laterality Date  . ABLATION    . CARPAL TUNNEL RELEASE    . CESAREAN SECTION    . NECK AND CHEST LESION      There were no vitals filed for this visit.   Subjective Assessment - 02/12/21 1057    Subjective Slowly getting better, dizziness sis less in the AM and more in the evening. States that squatting and looking back really makes her dizzy.    Currently in Pain? Yes    Pain Score 2     Pain Descriptors / Indicators Other (Comment)   dizziness   Pain Onset More than a month ago              [facility] PT Assessment - 02/12/21 0001      Assessment   Medical Diagnosis Dizziness    Referring Provider (PT) Leta Baptist                         [facility] Adult PT Treatment/Exercise - 02/12/21 0001      Neck Exercises: Standing   Other Standing Exercises lunges - too difficult secondary to weakness in legs; ssquats with trunk  rotaiton 3x5 Bilateral; narrow base of support on blue foam - x4 30" holds    Other Standing Exercises warrior I --> II with head turns 4x5 B; narrow base of support on blue foam x4 30" holds                    PT Short Term Goals - 01/27/21 1608      PT SHORT TERM GOAL #1   Title Pt to be dizzy 10 or less times a day    Baseline between 20-30 episodes a day    Time 2    Period Weeks    Status Not Met    Target Date 01/27/21      PT SHORT TERM GOAL #2   Title Pt cervical rotation to increase to 55 degrees B to assist in safer driving    Baseline 45 degrees left rotation, 60 degrees right rotation    Time 2    Period Weeks    Status On-going    Target Date 02/10/21      PT  SHORT TERM GOAL #3   Title Patient will decrease risk for falls as evidenced by score of 20/22 Dynamic Gait Index    Baseline 17/22 indicating moderate risk for falls    Time 2    Period Weeks    Status New    Target Date 02/10/21      PT SHORT TERM GOAL #4   Title Demonstrate independence with LE strengthening HEP to reduce risk for falls    Time 2    Period Weeks    Status New    Target Date 02/10/21             PT Long Term Goals - 01/27/21 1703      PT LONG TERM GOAL #1   Title Pt to be having 5 or less bouts of dizziness a day    Baseline 20-30 episodes    Time 4    Period Weeks    Status On-going    Target Date 02/24/21      PT LONG TERM GOAL #2   Title Pt to be able to turn her head 65 degrees in both directions for safer driving    Baseline 45 degrees left rotation, 60 degrees right    Time 4    Period Weeks    Status On-going    Target Date 02/24/21      PT LONG TERM GOAL #3   Title Pt to be I in her HEP and confident in continuing them on her own until no dizziness    Baseline pt reports independent daily performance of habituation exercises    Time 4    Status Achieved      PT LONG TERM GOAL #4   Title PT to be able to single leg stance for 30 seconds B for  decrease risk of falling    Baseline Right: 12 sec; left 17 sec    Time 4    Period Weeks    Status On-going    Target Date 02/24/21                 Plan - 02/12/21 1048    Clinical Impression Statement Progressed dynamic balance exercises as tolerated. Increased dizziness noted with all exercises. Fatigue noted and allowed patient to rest. Lunges were too challenging on legs from a strength standpoint so this was stopped. Will continue with current POC as tolerated. Increased dizziness to 5/10 reported end of session.    Personal Factors and Comorbidities Past/Current Experience;Time since onset of injury/illness/exacerbation    Examination-Activity Limitations Bed Mobility;Bend;Bathing;Carry;Dressing;Locomotion Level;Stairs;Stand    Examination-Participation Restrictions Cleaning;Community Activity;Driving;Laundry;Meal Prep;Occupation    Stability/Clinical Decision Making Evolving/Moderate complexity    Rehab Potential Good    PT Frequency 2x / week    PT Duration 4 weeks    PT Treatment/Interventions Manual techniques;Manual lymph drainage;Patient/family education;Therapeutic exercise;Balance training;Neuromuscular re-education    PT Next Visit Plan dynamic balance, DGI activities continued habituation exercises    PT Home Exercise Plan cervical ROM and eye exercises, 12/15/20: VOR; 1/21: nose to knee, gaze exercise;2/2 upper trap stretch; 2/4: Brandt-Daroof; tandem stance with head turns.; 3/11 squat with trunk turns and wariror I with trunk turns    Consulted and Agree with Plan of Care Patient           Patient will benefit from skilled therapeutic intervention in order to improve the following deficits and impairments:  Decreased activity tolerance,Decreased balance,Decreased range of motion,Dizziness,Postural dysfunction  Visit Diagnosis: Dizziness and giddiness  Problem List Patient Active Problem List   Diagnosis Date Noted  . Genetic testing 02/21/2017  .  Family history of breast cancer   . Cancer ([place]) 12/06/2007   11:37 AM, 02/12/21 [name], DPT Physical Therapy with [facility]  [phone] office  [place] [address] [place], [place], [address] Phone: [phone]   Fax:  [phone]  Name: [name] MRN: [id number] Date of Birth: [birth year]

## 2021-02-15 ENCOUNTER — Other Ambulatory Visit: Payer: Self-pay

## 2021-02-15 ENCOUNTER — Ambulatory Visit (HOSPITAL_COMMUNITY): Payer: BC Managed Care – PPO | Admitting: Physical Therapy

## 2021-02-15 DIAGNOSIS — R42 Dizziness and giddiness: Secondary | ICD-10-CM

## 2021-02-15 NOTE — Therapy (Signed)
[facility] [address] [place], [place], [address] Phone: [phone]   Fax:  [phone]  Physical Therapy Treatment  Patient Details  Name: [name] MRN: [id number] Date of Birth: 07/12/1971 Referring Provider (PT): Su Teoh   Encounter Date: 02/15/2021   PT End of Session - 02/15/21 1654    Visit Number 12    Number of Visits 16    Date for PT Re-Evaluation 02/24/21    Authorization Type BCBS, requesting extension x 8 visits with recertification performed 01/27/21    Progress Note Due on Visit 18    PT Start Time 1615    PT Stop Time 1658    PT Time Calculation (min) 43 min    Activity Tolerance Patient tolerated treatment well    Behavior During Therapy [facility] for tasks assessed/performed           Past Medical History:  Diagnosis Date  . BV (bacterial vaginosis)    BV  . Cancer ([facility]) 2009   papillary thyroid cancer  . Diabetes mellitus without complication ([facility])   . Family history of breast cancer   . IBS (irritable bowel syndrome)     Past Surgical History:  Procedure Laterality Date  . ABLATION    . CARPAL TUNNEL RELEASE    . CESAREAN SECTION    . NECK AND CHEST LESION      There were no vitals filed for this visit.   Subjective Assessment - 02/15/21 1620    Subjective Pt states she has no questions on her HEP her dizziness is going away quicker when she has it.    Currently in Pain? No/denies    Pain Onset More than a month ago                                  Balance Exercises - 02/15/21 0001      Balance Exercises: Standing   Standing Eyes Closed Narrow base of support (BOS);5 reps   shifting wt outside Bos   SLS Eyes open;3 reps   Rt 13" ;LT 27   Balance Beam tandem retro x 2 reps each    Tandem Gait Forward;2 reps   with head turns   Retro Gait 2 reps    Marching Solid surface;Static;10 reps   reaching up with opposite arm   Sit to Stand Standard surface   10   Other  Standing Exercises all 4 opposite arm leg x 5 reps each; pallof red t-band x 15    Other Standing Exercises Comments habituation 90 degree turns ;               PT Short Term Goals - 02/15/21 1657      PT SHORT TERM GOAL #1   Title Pt to be dizzy 10 or less times a day    Baseline between 20-30 episodes a day    Time 2    Period Weeks    Status Partially Met    Target Date 01/27/21      PT SHORT TERM GOAL #2   Title Pt cervical rotation to increase to 55 degrees B to assist in safer driving    Baseline 45 degrees left rotation, 60 degrees right rotation    Time 2    Period Weeks    Status On-going    Target Date 02/10/21      PT SHORT TERM GOAL #  3   Title Patient will decrease risk for falls as evidenced by score of 20/22 Dynamic Gait Index    Baseline 17/22 indicating moderate risk for falls    Time 2    Period Weeks    Status New    Target Date 02/10/21      PT SHORT TERM GOAL #4   Title Demonstrate independence with LE strengthening HEP to reduce risk for falls    Time 2    Period Weeks    Status New    Target Date 02/10/21             PT Long Term Goals - 02/15/21 1658      PT LONG TERM GOAL #1   Title Pt to be having 5 or less bouts of dizziness a day    Baseline 20-30 episodes    Time 4    Period Weeks    Status On-going      PT LONG TERM GOAL #2   Title Pt to be able to turn her head 65 degrees in both directions for safer driving    Baseline 45 degrees left rotation, 60 degrees right    Time 4    Period Weeks    Status On-going      PT LONG TERM GOAL #3   Title Pt to be I in her HEP and confident in continuing them on her own until no dizziness    Baseline pt reports independent daily performance of habituation exercises    Time 4    Status Achieved      PT LONG TERM GOAL #4   Title PT to be able to single leg stance for 30 seconds B for decrease risk of falling    Baseline Right: 12 sec; left 17 sec    Time 4    Period Weeks     Status On-going                 Plan - 02/15/21 1655    Clinical Impression Statement Pt progressed with dynamic challenges with head turns, foam and habituation.  Pt needed min assist to in exercises for safety.  Overall pt frequency and duration of dizziness has been decreasing.    Personal Factors and Comorbidities Past/Current Experience;Time since onset of injury/illness/exacerbation    Examination-Activity Limitations Bed Mobility;Bend;Bathing;Carry;Dressing;Locomotion Level;Stairs;Stand    Examination-Participation Restrictions Cleaning;Community Activity;Driving;Laundry;Meal Prep;Occupation    Stability/Clinical Decision Making Evolving/Moderate complexity    Rehab Potential Good    PT Frequency 2x / week    PT Duration 4 weeks    PT Treatment/Interventions Manual techniques;Manual lymph drainage;Patient/family education;Therapeutic exercise;Balance training;Neuromuscular re-education    PT Next Visit Plan Improve pt cervical rotation. dynamic balance, DGI activities continued habituation exercises    PT Home Exercise Plan cervical ROM and eye exercises, 12/15/20: VOR; 1/21: nose to knee, gaze exercise;2/2 upper trap stretch; 2/4: Brandt-Daroof; tandem stance with head turns.; 3/11 squat with trunk turns and wariror I with trunk turns    Consulted and Agree with Plan of Care Patient           Patient will benefit from skilled therapeutic intervention in order to improve the following deficits and impairments:  Decreased activity tolerance,Decreased balance,Decreased range of motion,Dizziness,Postural dysfunction  Visit Diagnosis: Dizziness and giddiness     Problem List Patient Active Problem List   Diagnosis Date Noted  . Genetic testing 02/21/2017  . Family history of breast cancer   . Cancer ([facility]) 12/06/2007   Nichole   [name], PT CLT [phone] 02/15/2021, 5:00 PM  [facility] [address] [place], [place],  [address] Phone: [phone]   Fax:  [phone]  Name: [name] MRN: [id number] Date of Birth: 06/20/1971   

## 2021-02-16 DIAGNOSIS — G43909 Migraine, unspecified, not intractable, without status migrainosus: Secondary | ICD-10-CM | POA: Diagnosis not present

## 2021-02-16 DIAGNOSIS — G609 Hereditary and idiopathic neuropathy, unspecified: Secondary | ICD-10-CM | POA: Diagnosis not present

## 2021-02-16 DIAGNOSIS — R42 Dizziness and giddiness: Secondary | ICD-10-CM | POA: Diagnosis not present

## 2021-02-16 DIAGNOSIS — E1140 Type 2 diabetes mellitus with diabetic neuropathy, unspecified: Secondary | ICD-10-CM | POA: Diagnosis not present

## 2021-02-17 ENCOUNTER — Ambulatory Visit (HOSPITAL_COMMUNITY): Payer: BC Managed Care – PPO

## 2021-02-17 ENCOUNTER — Encounter (HOSPITAL_COMMUNITY): Payer: BC Managed Care – PPO

## 2021-02-18 ENCOUNTER — Encounter (HOSPITAL_COMMUNITY): Payer: BC Managed Care – PPO | Admitting: Physical Therapy

## 2021-02-18 DIAGNOSIS — M79606 Pain in leg, unspecified: Secondary | ICD-10-CM | POA: Insufficient documentation

## 2021-02-24 ENCOUNTER — Ambulatory Visit (HOSPITAL_COMMUNITY): Payer: BC Managed Care – PPO

## 2021-02-26 ENCOUNTER — Other Ambulatory Visit: Payer: Self-pay

## 2021-02-26 ENCOUNTER — Ambulatory Visit (HOSPITAL_COMMUNITY): Payer: BC Managed Care – PPO | Admitting: Physical Therapy

## 2021-02-26 ENCOUNTER — Encounter (HOSPITAL_COMMUNITY): Payer: Self-pay | Admitting: Physical Therapy

## 2021-02-26 DIAGNOSIS — R42 Dizziness and giddiness: Secondary | ICD-10-CM | POA: Diagnosis not present

## 2021-02-26 NOTE — Therapy (Addendum)
[place], [place], [address] Phone: [phone]   Fax:  [phone]  Physical Therapy Treatment and [place]  Patient Details  Name: [name] MRN: [id number] Date of Birth: [birth year] Referring Provider (PT): Leta Baptist   Encounter Date: 02/26/2021   PT End of Session - 02/26/21 0831    Visit Number 13    Number of Visits 16    Date for PT Re-Evaluation 02/24/21    Authorization Type BCBS, requesting extension x 8 visits with recertification performed 01/27/21    Progress Note Due on Visit 18    PT Start Time 0831    PT Stop Time 0909    PT Time Calculation (min) 38 min    Activity Tolerance Patient tolerated treatment well    Behavior During Therapy [facility] for tasks assessed/performed           Past Medical History:  Diagnosis Date  . BV (bacterial vaginosis)    BV  . Cancer [facility]) 2009   papillary thyroid cancer  . Diabetes mellitus without complication ([place])   . Family history of breast cancer   . IBS (irritable bowel syndrome)     Past Surgical History:  Procedure Laterality Date  . ABLATION    . CARPAL TUNNEL RELEASE    . CESAREAN SECTION    . NECK AND CHEST LESION      There were no vitals filed for this visit.   Subjective Assessment - 02/26/21 0836    Subjective States she has had a migraine for a whole week and is slowly getting better. States that she didn't sleep last night and isn't feeling the greatest today.    Currently in Pain? Yes    Pain Score 5    dizziness   Pain Onset More than a month ago              [facility] PT Assessment - 02/26/21 0001      Assessment   Medical Diagnosis Dizziness    Referring Provider (PT) Leta Baptist                         [facility] Adult PT Treatment/Exercise - 02/26/21 0001      Neck Exercises: Standing   Other Standing Exercises walking tandem on line on floor - 15 feet x6 laps      Neck Exercises: Seated   Other Seated Exercise  STS with trunk rotation 3x5 Bilateral                  PT Education - 02/26/21 0857    Education Details educated patient on common migraine triggers, recognizing triggers and keeping log of symptoms and food intact. O food vs fake sugars, dirty keto and clean keto per patient request    Person(s) Educated Patient    Methods Explanation    Comprehension Verbalized understanding            PT Short Term Goals - 02/15/21 1657      PT SHORT TERM GOAL #1   Title Pt to be dizzy 10 or less times a day    Baseline between 20-30 episodes a day    Time 2    Period Weeks    Status Partially Met    Target Date 01/27/21      PT SHORT TERM GOAL #2   Title Pt cervical rotation to increase to 55 degrees B to assist in  safer driving    Baseline 45 degrees left rotation, 60 degrees right rotation    Time 2    Period Weeks    Status On-going    Target Date 02/10/21      PT SHORT TERM GOAL #3   Title Patient will decrease risk for falls as evidenced by score of 20/22 Dynamic Gait Index    Baseline 17/22 indicating moderate risk for falls    Time 2    Period Weeks    Status New    Target Date 02/10/21      PT SHORT TERM GOAL #4   Title Demonstrate independence with LE strengthening HEP to reduce risk for falls    Time 2    Period Weeks    Status New    Target Date 02/10/21             PT Long Term Goals - 02/15/21 1658      PT LONG TERM GOAL #1   Title Pt to be having 5 or less bouts of dizziness a day    Baseline 20-30 episodes    Time 4    Period Weeks    Status On-going      PT LONG TERM GOAL #2   Title Pt to be able to turn her head 65 degrees in both directions for safer driving    Baseline 45 degrees left rotation, 60 degrees right    Time 4    Period Weeks    Status On-going      PT LONG TERM GOAL #3   Title Pt to be I in her HEP and confident in continuing them on her own until no dizziness    Baseline pt reports independent daily performance of  habituation exercises    Time 4    Status Achieved      PT LONG TERM GOAL #4   Title PT to be able to single leg stance for 30 seconds B for decrease risk of falling    Baseline Right: 12 sec; left 17 sec    Time 4    Period Weeks    Status On-going                 Plan - 02/26/21 0831    Clinical Impression Statement Session limited secondary to increased dizziness, fatigue and migraine. Focused on education of food diary, migraine diary and differences in different types of food and fake sugars. Increased dizziness and migraine noted with all activities today. Will continue with current POC next session and follow up with food diary.    Personal Factors and Comorbidities Past/Current Experience;Time since onset of injury/illness/exacerbation    Examination-Activity Limitations Bed Mobility;Bend;Bathing;Carry;Dressing;Locomotion Level;Stairs;Stand    Examination-Participation Restrictions Cleaning;Community Activity;Driving;Laundry;Meal Prep;Occupation    Stability/Clinical Decision Making Evolving/Moderate complexity    Rehab Potential Good    PT Frequency 2x / week    PT Duration 4 weeks    PT Treatment/Interventions Manual techniques;Manual lymph drainage;Patient/family education;Therapeutic exercise;Balance training;Neuromuscular re-education    PT Next Visit Plan Improve pt cervical rotation. dynamic balance, DGI activities continued habituation exercises    PT Home Exercise Plan cervical ROM and eye exercises, 12/15/20: VOR; 1/21: nose to knee, gaze exercise;2/2 upper trap stretch; 2/4: Brandt-Daroof; tandem stance with head turns.; 3/11 squat with trunk turns and wariror I with trunk turns    Consulted and Agree with Plan of Care Patient           Patient will benefit from skilled therapeutic  intervention in order to improve the following deficits and impairments:  Decreased activity tolerance,Decreased balance,Decreased range of motion,Dizziness,Postural  dysfunction  Visit Diagnosis: Dizziness and giddiness     Problem List Patient Active Problem List   Diagnosis Date Noted  . Genetic testing 02/21/2017  . Family history of breast cancer   . Cancer ([place]) 12/06/2007   9:10 AM, 02/26/21 [name], DPT Physical Therapy with [facility]  [phone] office  [place] [address] [place], [place], [address] Phone: [phone]   Fax:  [phone]  Name: [name] MRN: [id number] Date of Birth: [birth year]

## 2021-03-02 ENCOUNTER — Encounter (HOSPITAL_COMMUNITY): Payer: Self-pay

## 2021-03-02 ENCOUNTER — Ambulatory Visit (HOSPITAL_COMMUNITY): Payer: BC Managed Care – PPO

## 2021-03-02 ENCOUNTER — Other Ambulatory Visit: Payer: Self-pay

## 2021-03-02 DIAGNOSIS — R42 Dizziness and giddiness: Secondary | ICD-10-CM

## 2021-03-02 NOTE — Therapy (Signed)
[place], [place], [address] Phone: [phone]   Fax:  [phone]  Physical Therapy Treatment  Patient Details  Name: [name] MRN: [id number] Date of Birth: Dec 23, 1970 Referring Provider (PT): Leta Baptist   Encounter Date: 03/02/2021   PT End of Session - 03/02/21 1432    Visit Number 14    Number of Visits 16    Date for PT Re-Evaluation 02/24/21    Authorization Type BCBS, requesting extension x 8 visits with recertification performed 01/27/21    Progress Note Due on Visit 18    PT Start Time 1432    PT Stop Time 1515    PT Time Calculation (min) 43 min    Activity Tolerance Patient tolerated treatment well    Behavior During Therapy [facility] for tasks assessed/performed           Past Medical History:  Diagnosis Date  . BV (bacterial vaginosis)    BV  . Cancer [facility]) 2009   papillary thyroid cancer  . Diabetes mellitus without complication ([place])   . Family history of breast cancer   . IBS (irritable bowel syndrome)     Past Surgical History:  Procedure Laterality Date  . ABLATION    . CARPAL TUNNEL RELEASE    . CESAREAN SECTION    . NECK AND CHEST LESION      There were no vitals filed for this visit.   Subjective Assessment - 03/02/21 1434    Subjective Pt reports migraine symptoms have improved and usually improved with rest and medicine. Pt reports 10-12 dizziness episodes/day    Currently in Pain? Yes    Pain Score 5     Pain Orientation Right;Left    Pain Onset More than a month ago              [facility] PT Assessment - 03/02/21 0001      Assessment   Medical Diagnosis Dizziness    Referring Provider (PT) Leta Baptist                         [facility] Adult PT Treatment/Exercise - 03/02/21 0001      Exercises   Exercises Knee/Hip      Knee/Hip Exercises: Aerobic   Recumbent Bike level 2 x 5 min               Balance Exercises - 03/02/21 0001      Balance  Exercises: Standing   Standing Eyes Closed Narrow base of support (BOS);5 reps    Tandem Stance 4 reps    SLS Eyes open;3 reps    Balance Beam tandem stance, poor stability    Tandem Gait Forward;2 reps    Retro Gait --   resisted retro-forward walk 2x2 min 4 plates, then 3 plates   Sidestepping Foam/compliant support;2 reps   2 min lateral sidestepping   Sit to Stand Standard surface   2x10 goblet squat 8 lbs              PT Short Term Goals - 02/15/21 1657      PT SHORT TERM GOAL #1   Title Pt to be dizzy 10 or less times a day    Baseline between 20-30 episodes a day    Time 2    Period Weeks    Status Partially Met    Target Date 01/27/21      PT SHORT TERM  GOAL #2   Title Pt cervical rotation to increase to 55 degrees B to assist in safer driving    Baseline 45 degrees left rotation, 60 degrees right rotation    Time 2    Period Weeks    Status On-going    Target Date 02/10/21      PT SHORT TERM GOAL #3   Title Patient will decrease risk for falls as evidenced by score of 20/22 Dynamic Gait Index    Baseline 17/22 indicating moderate risk for falls    Time 2    Period Weeks    Status New    Target Date 02/10/21      PT SHORT TERM GOAL #4   Title Demonstrate independence with LE strengthening HEP to reduce risk for falls    Time 2    Period Weeks    Status New    Target Date 02/10/21             PT Long Term Goals - 02/15/21 1658      PT LONG TERM GOAL #1   Title Pt to be having 5 or less bouts of dizziness a day    Baseline 20-30 episodes    Time 4    Period Weeks    Status On-going      PT LONG TERM GOAL #2   Title Pt to be able to turn her head 65 degrees in both directions for safer driving    Baseline 45 degrees left rotation, 60 degrees right    Time 4    Period Weeks    Status On-going      PT LONG TERM GOAL #3   Title Pt to be I in her HEP and confident in continuing them on her own until no dizziness    Baseline pt reports  independent daily performance of habituation exercises    Time 4    Status Achieved      PT LONG TERM GOAL #4   Title PT to be able to single leg stance for 30 seconds B for decrease risk of falling    Baseline Right: 12 sec; left 17 sec    Time 4    Period Weeks    Status On-going                 Plan - 03/02/21 1527    Clinical Impression Statement Continues to exhibit poor stability when standing with narrow BOS or on compliant surfaces with difficulty in initiating ankle strategy. Increased postural sway with eyes closed and difficulty in ascertaining proprioception    Personal Factors and Comorbidities Past/Current Experience;Time since onset of injury/illness/exacerbation    Examination-Activity Limitations Bed Mobility;Bend;Bathing;Carry;Dressing;Locomotion Level;Stairs;Stand    Examination-Participation Restrictions Cleaning;Community Activity;Driving;Laundry;Meal Prep;Occupation    Stability/Clinical Decision Making Evolving/Moderate complexity    Rehab Potential Good    PT Frequency 2x / week    PT Duration 4 weeks    PT Treatment/Interventions Manual techniques;Manual lymph drainage;Patient/family education;Therapeutic exercise;Balance training;Neuromuscular re-education    PT Next Visit Plan Improve pt cervical rotation. dynamic balance, DGI activities continued habituation exercises    PT Home Exercise Plan cervical ROM and eye exercises, 12/15/20: VOR; 1/21: nose to knee, gaze exercise;2/2 upper trap stretch; 2/4: Brandt-Daroof; tandem stance with head turns.; 3/11 squat with trunk turns and wariror I with trunk turns    Consulted and Agree with Plan of Care Patient           Patient will benefit from skilled therapeutic intervention  in order to improve the following deficits and impairments:  Decreased activity tolerance,Decreased balance,Decreased range of motion,Dizziness,Postural dysfunction  Visit Diagnosis: Dizziness and giddiness     Problem  List Patient Active Problem List   Diagnosis Date Noted  . Genetic testing 02/21/2017  . Family history of breast cancer   . Cancer ([place]) 12/06/2007   3:30 PM, 03/02/21 M. Sherlyn Lees, PT, DPT Physical Therapist- [place] Office Number: [phone]  [place] [address] [place], [place], [address] Phone: [phone]   Fax:  [phone]  Name: [name] MRN: [id number] Date of Birth: 05/06/1971

## 2021-03-03 ENCOUNTER — Encounter (HOSPITAL_COMMUNITY): Payer: Self-pay | Admitting: Physical Therapy

## 2021-03-03 ENCOUNTER — Ambulatory Visit (HOSPITAL_COMMUNITY): Payer: BC Managed Care – PPO | Admitting: Physical Therapy

## 2021-03-03 DIAGNOSIS — R42 Dizziness and giddiness: Secondary | ICD-10-CM | POA: Diagnosis not present

## 2021-03-03 NOTE — Therapy (Signed)
[place], [place], [address] Phone: [phone]   Fax:  [phone]  Physical Therapy Treatment  Patient Details  Name: [name] MRN: [id number] Date of Birth: 09/08/1971 Referring Provider (PT): Leta Baptist   Encounter Date: 03/03/2021   PT End of Session - 03/03/21 1404    Visit Number 15    Number of Visits 24    Date for PT Re-Evaluation 03/26/21    Authorization Type BCBS, requesting extension x 8 visits with recertification performed 01/27/21    Progress Note Due on Visit 18    PT Start Time 1404    PT Stop Time 1445    PT Time Calculation (min) 41 min    Activity Tolerance Patient tolerated treatment well    Behavior During Therapy [facility] for tasks assessed/performed           Past Medical History:  Diagnosis Date  . BV (bacterial vaginosis)    BV  . Cancer [facility]) 2009   papillary thyroid cancer  . Diabetes mellitus without complication ([place])   . Family history of breast cancer   . IBS (irritable bowel syndrome)     Past Surgical History:  Procedure Laterality Date  . ABLATION    . CARPAL TUNNEL RELEASE    . CESAREAN SECTION    . NECK AND CHEST LESION      There were no vitals filed for this visit.   Subjective Assessment - 03/03/21 1406    Subjective States she feels about the same as she did yesterday about a 5/10 dizziness    Currently in Pain? Yes    Pain Score 5    dizizness   Pain Onset More than a month ago              [facility] PT Assessment - 03/03/21 0001      Assessment   Medical Diagnosis Dizziness    Referring Provider (PT) Leta Baptist                         [facility] Adult PT Treatment/Exercise - 03/03/21 0001      Neck Exercises: Standing   Other Standing Exercises standing with ball toss left ot right with eye follow - regular stance 3x5 B    Other Standing Exercises standing on dyno disc green for as long as possible in 30 second time period with  intermitent hand assist x5 in // bars      Knee/Hip Exercises: Standing   Other Standing Knee Exercises lateral step ups on 4" step with patient looking up with ascent and down with descent 4x10 B; tandem on floor with overhead ball toss and looking up 3x5 B    Other Standing Knee Exercises rocker board lateral - trying to control movement - x4 of 60 seconds; forward/medial x4 60" holds                    PT Short Term Goals - 02/15/21 1657      PT SHORT TERM GOAL #1   Title Pt to be dizzy 10 or less times a day    Baseline between 20-30 episodes a day    Time 2    Period Weeks    Status Partially Met    Target Date 01/27/21      PT SHORT TERM GOAL #2   Title Pt cervical rotation to increase to 55 degrees B to assist  in safer driving    Baseline 45 degrees left rotation, 60 degrees right rotation    Time 2    Period Weeks    Status On-going    Target Date 02/10/21      PT SHORT TERM GOAL #3   Title Patient will decrease risk for falls as evidenced by score of 20/22 Dynamic Gait Index    Baseline 17/22 indicating moderate risk for falls    Time 2    Period Weeks    Status New    Target Date 02/10/21      PT SHORT TERM GOAL #4   Title Demonstrate independence with LE strengthening HEP to reduce risk for falls    Time 2    Period Weeks    Status New    Target Date 02/10/21             PT Long Term Goals - 02/15/21 1658      PT LONG TERM GOAL #1   Title Pt to be having 5 or less bouts of dizziness a day    Baseline 20-30 episodes    Time 4    Period Weeks    Status On-going      PT LONG TERM GOAL #2   Title Pt to be able to turn her head 65 degrees in both directions for safer driving    Baseline 45 degrees left rotation, 60 degrees right    Time 4    Period Weeks    Status On-going      PT LONG TERM GOAL #3   Title Pt to be I in her HEP and confident in continuing them on her own until no dizziness    Baseline pt reports independent daily  performance of habituation exercises    Time 4    Status Achieved      PT LONG TERM GOAL #4   Title PT to be able to single leg stance for 30 seconds B for decrease risk of falling    Baseline Right: 12 sec; left 17 sec    Time 4    Period Weeks    Status On-going                 Plan - 03/03/21 1419    Clinical Impression Statement Challenged patient with dynamic and static exercises. Increase in dizziness noted with all exercises though exercises on dyno disc most challenging at this time. Symptoms and execution of exercises improved with repetition. Overall symptoms the same end of session. Will continue with current POC as tolerated.    Personal Factors and Comorbidities Past/Current Experience;Time since onset of injury/illness/exacerbation    Examination-Activity Limitations Bed Mobility;Bend;Bathing;Carry;Dressing;Locomotion Level;Stairs;Stand    Examination-Participation Restrictions Cleaning;Community Activity;Driving;Laundry;Meal Prep;Occupation    Stability/Clinical Decision Making Evolving/Moderate complexity    Rehab Potential Good    PT Frequency 2x / week    PT Duration 4 weeks    PT Treatment/Interventions Manual techniques;Manual lymph drainage;Patient/family education;Therapeutic exercise;Balance training;Neuromuscular re-education    PT Next Visit Plan Improve pt cervical rotation. dynamic balance, DGI activities continued habituation exercises    PT Home Exercise Plan cervical ROM and eye exercises, 12/15/20: VOR; 1/21: nose to knee, gaze exercise;2/2 upper trap stretch; 2/4: Brandt-Daroof; tandem stance with head turns.; 3/11 squat with trunk turns and wariror I with trunk turns    Consulted and Agree with Plan of Care Patient           Patient will benefit from skilled therapeutic intervention in  order to improve the following deficits and impairments:  Decreased activity tolerance,Decreased balance,Decreased range of motion,Dizziness,Postural  dysfunction  Visit Diagnosis: Dizziness and giddiness     Problem List Patient Active Problem List   Diagnosis Date Noted  . Genetic testing 02/21/2017  . Family history of breast cancer   . Cancer ([place]) 12/06/2007   3:50 PM, 03/03/21 [name], DPT Physical Therapy with [place]  [phone] office  [place] [address] [place], [place], [address] Phone: [phone]   Fax:  [phone]  Name: SHANDON MATSON MRN: [id number] Date of Birth: 07/07/1971

## 2021-03-03 NOTE — Addendum Note (Signed)
Addended by: [name] R on: 03/03/2021 07:41 AM   Modules accepted: Orders

## 2021-03-04 ENCOUNTER — Encounter (HOSPITAL_COMMUNITY): Payer: BC Managed Care – PPO | Admitting: Physical Therapy

## 2021-03-04 DIAGNOSIS — L0100 Impetigo, unspecified: Secondary | ICD-10-CM | POA: Diagnosis not present

## 2021-03-04 DIAGNOSIS — L723 Sebaceous cyst: Secondary | ICD-10-CM | POA: Diagnosis not present

## 2021-03-08 ENCOUNTER — Telehealth (HOSPITAL_COMMUNITY): Payer: Self-pay | Admitting: Physical Therapy

## 2021-03-08 ENCOUNTER — Ambulatory Visit (HOSPITAL_COMMUNITY): Payer: BC Managed Care – PPO | Admitting: Physical Therapy
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
## 2021-03-10 ENCOUNTER — Ambulatory Visit (HOSPITAL_COMMUNITY): Payer: BC Managed Care – PPO | Admitting: Physical Therapy

## 2021-03-11 ENCOUNTER — Encounter (HOSPITAL_COMMUNITY): Payer: BC Managed Care – PPO | Admitting: Physical Therapy

## 2021-03-15 ENCOUNTER — Telehealth (HOSPITAL_COMMUNITY): Payer: Self-pay | Admitting: Physical Therapy

## 2021-03-15 ENCOUNTER — Ambulatory Visit (HOSPITAL_COMMUNITY): Payer: BC Managed Care – PPO | Attending: Otolaryngology | Admitting: Physical Therapy

## 2021-03-15 DIAGNOSIS — R42 Dizziness and giddiness: Secondary | ICD-10-CM | POA: Insufficient documentation

## 2021-03-15 NOTE — Telephone Encounter (Signed)
No show. Called and left patient a VM about missed appointment and about next appointment.  3:54 PM, 03/15/21 [name], DPT Physical Therapy with [facility]  [phone] office

## 2021-03-17 ENCOUNTER — Ambulatory Visit (HOSPITAL_COMMUNITY): Payer: BC Managed Care – PPO | Admitting: Physical Therapy

## 2021-03-17 ENCOUNTER — Other Ambulatory Visit: Payer: Self-pay

## 2021-03-17 ENCOUNTER — Encounter (HOSPITAL_COMMUNITY): Payer: Self-pay | Admitting: Physical Therapy

## 2021-03-17 DIAGNOSIS — R42 Dizziness and giddiness: Secondary | ICD-10-CM | POA: Diagnosis not present

## 2021-03-17 NOTE — Therapy (Signed)
[place] [address] [place], [place], [address] Phone: [phone]   Fax:  [phone]  Physical Therapy Treatment and Discharge Note  Patient Details  Name: [name] MRN: [id number] Date of Birth: 1971/03/09 Referring Provider (PT): Leta Baptist  PHYSICAL THERAPY DISCHARGE SUMMARY  Visits from Start of Care: 16  Current functional level related to goals / functional outcomes:  no goals met    Remaining deficits: Continued symptoms/dizziness   Education / Equipment:  see below  Plan: Patient agrees to discharge.  Patient goals were not met. Patient is being discharged due to lack of progress.  ?????           Encounter Date: 03/17/2021   PT End of Session - 03/17/21 1531    Visit Number 16    Number of Visits 24    Date for PT Re-Evaluation 03/26/21    Authorization Type BCBS, requesting extension x 8 visits with recertification performed 01/27/21    Progress Note Due on Visit 18    PT Start Time 1531    PT Stop Time 1558    PT Time Calculation (min) 27 min    Activity Tolerance Patient tolerated treatment well    Behavior During Therapy WFL for tasks assessed/performed           Past Medical History:  Diagnosis Date  . BV (bacterial vaginosis)    BV  . Cancer [facility]) 2009   papillary thyroid cancer  . Diabetes mellitus without complication ([place])   . Family history of breast cancer   . IBS (irritable bowel syndrome)     Past Surgical History:  Procedure Laterality Date  . ABLATION    . CARPAL TUNNEL RELEASE    . CESAREAN SECTION    . NECK AND CHEST LESION      There were no vitals filed for this visit.   Subjective Assessment - 03/17/21 1538    Subjective States overall since starting PT she feels about 20-25% better. States she still has difficulty with standing up and sitting down, getting out bed. Reports she tripped over her own feet yesterday when she was walking from the living room to  kitchen. States that's that she has learned how to move and accommodate for her dizziness like grabbing the sheet to assist with getting out of bed. Current dizziness level 6.5/10. States that she didn't feel good last week so she didn't do her exercises. States that she is trying to get up and walk around more.    Currently in Pain? Yes    Pain Score 7    dizziness   Pain Onset More than a month ago              [facility] PT Assessment - 03/17/21 0001      Assessment   Medical Diagnosis Dizziness    Referring Provider (PT) Leta Baptist    Next MD Visit 04/19/21      AROM   Cervical - Right Rotation 42   painful, was 60   Cervical - Left Rotation 40   painful, was 45     Balance   Balance Assessed Yes   SLS left 12.5 seconds right 8.2 seconds;     Dynamic Gait Index   Level Surface Normal    Change in Gait Speed Mild Impairment    Gait with Horizontal Head Turns Mild Impairment    Gait with Vertical Head Turns Mild Impairment    Gait and  Pivot Turn Mild Impairment    Step Over Obstacle Mild Impairment    Step Around Obstacles Mild Impairment    Steps Mild Impairment    Total Score 17                                 PT Education - 03/17/21 1559    Education Details On DGI, On current presentation, on plan moving forward.    Person(s) Educated Patient    Methods Explanation    Comprehension Verbalized understanding            PT Short Term Goals - 03/17/21 1539      PT SHORT TERM GOAL #1   Title Pt to be dizzy 10 or less times a day    Baseline between 15-20 episodes a day    Time 2    Period Weeks    Status On-going    Target Date 01/27/21      PT SHORT TERM GOAL #2   Title Pt cervical rotation to increase to 55 degrees B to assist in safer driving    Baseline current 42 right rotation (painful); 40 degrees left rotation (painful) was:45 degrees left rotation, 60 degrees right rotation    Time 2    Period Weeks    Status On-going    Target  Date 02/10/21      PT SHORT TERM GOAL #3   Title Patient will decrease risk for falls as evidenced by score of 20/22 Dynamic Gait Index    Baseline 17/22    Time 2    Period Weeks    Status On-going    Target Date 02/10/21      PT SHORT TERM GOAL #4   Title Demonstrate independence with LE strengthening HEP to reduce risk for falls    Time 2    Period Weeks    Status On-going    Target Date 02/10/21             PT Long Term Goals - 03/17/21 1549      PT LONG TERM GOAL #1   Title Pt to be having 5 or less bouts of dizziness a day    Baseline 15-20 episodes    Time 4    Period Weeks    Status On-going      PT LONG TERM GOAL #2   Title Pt to be able to turn her head 65 degrees in both directions for safer driving    Baseline see objective    Time 4    Period Weeks    Status On-going      PT LONG TERM GOAL #3   Title Pt to be I in her HEP and confident in continuing them on her own until no dizziness    Time 4    Status On-going      PT LONG TERM GOAL #4   Title PT to be able to single leg stance for 30 seconds B for decrease risk of falling    Baseline current left 12.5 seconds right 8.2 seconds; was Right: 12 sec; left 17 sec    Time 4    Period Weeks    Status On-going                 Plan - 03/17/21 1559    Clinical Impression Statement Patient presents to therapy with mini reassessment. Overall patient has not met any goals  and continues to have all the same symptoms she was experiencing at the start of therapy. Answered all questions and discussed lack of progress since the start of therapy and needing to follow up with MD as therapy has not significantly improved her symptoms. Answered all questions prior to end of session. Patient to discharge from PT to HEP at this time.    Personal Factors and Comorbidities Past/Current Experience;Time since onset of injury/illness/exacerbation    Examination-Activity Limitations Bed  Mobility;Bend;Bathing;Carry;Dressing;Locomotion Level;Stairs;Stand    Examination-Participation Restrictions Cleaning;Community Activity;Driving;Laundry;Meal Prep;Occupation    Stability/Clinical Decision Making Evolving/Moderate complexity    Rehab Potential Good    PT Frequency 2x / week    PT Duration 4 weeks    PT Treatment/Interventions Manual techniques;Manual lymph drainage;Patient/family education;Therapeutic exercise;Balance training;Neuromuscular re-education    PT Next Visit Plan DC to HEP    PT Home Exercise Plan cervical ROM and eye exercises, 12/15/20: VOR; 1/21: nose to knee, gaze exercise;2/2 upper trap stretch; 2/4: Brandt-Daroof; tandem stance with head turns.; 3/11 squat with trunk turns and wariror I with trunk turns    Consulted and Agree with Plan of Care Patient           Patient will benefit from skilled therapeutic intervention in order to improve the following deficits and impairments:  Decreased activity tolerance,Decreased balance,Decreased range of motion,Dizziness,Postural dysfunction  Visit Diagnosis: Dizziness and giddiness     Problem List Patient Active Problem List   Diagnosis Date Noted  . Genetic testing 02/21/2017  . Family history of breast cancer   . Cancer ([place]) 12/06/2007    4:01 PM, 03/17/21 [name], DPT Physical Therapy with [facility]  [phone] office  [place] [address] [place], [place], [address] Phone: [phone]   Fax:  [phone]  Name: ROYALTY FAKHOURI MRN: [id number] Date of Birth: [birth year]

## 2021-03-18 DIAGNOSIS — L080 Pyoderma: Secondary | ICD-10-CM | POA: Diagnosis not present

## 2021-03-30 DIAGNOSIS — L0100 Impetigo, unspecified: Secondary | ICD-10-CM | POA: Diagnosis not present

## 2021-04-01 DIAGNOSIS — R5383 Other fatigue: Secondary | ICD-10-CM | POA: Diagnosis not present

## 2021-04-01 DIAGNOSIS — E063 Autoimmune thyroiditis: Secondary | ICD-10-CM | POA: Diagnosis not present

## 2021-04-01 DIAGNOSIS — E119 Type 2 diabetes mellitus without complications: Secondary | ICD-10-CM | POA: Diagnosis not present

## 2021-04-01 DIAGNOSIS — E7849 Other hyperlipidemia: Secondary | ICD-10-CM | POA: Diagnosis not present

## 2021-04-01 DIAGNOSIS — Z6833 Body mass index (BMI) 33.0-33.9, adult: Secondary | ICD-10-CM | POA: Diagnosis not present

## 2021-04-01 DIAGNOSIS — E6609 Other obesity due to excess calories: Secondary | ICD-10-CM | POA: Diagnosis not present

## 2021-04-01 DIAGNOSIS — J309 Allergic rhinitis, unspecified: Secondary | ICD-10-CM | POA: Diagnosis not present

## 2021-04-01 DIAGNOSIS — E1165 Type 2 diabetes mellitus with hyperglycemia: Secondary | ICD-10-CM | POA: Diagnosis not present

## 2021-04-01 DIAGNOSIS — Z0001 Encounter for general adult medical examination with abnormal findings: Secondary | ICD-10-CM | POA: Diagnosis not present

## 2021-05-20 DIAGNOSIS — R42 Dizziness and giddiness: Secondary | ICD-10-CM | POA: Diagnosis not present

## 2021-05-20 DIAGNOSIS — M79606 Pain in leg, unspecified: Secondary | ICD-10-CM | POA: Diagnosis not present

## 2021-05-20 DIAGNOSIS — G609 Hereditary and idiopathic neuropathy, unspecified: Secondary | ICD-10-CM | POA: Diagnosis not present

## 2021-05-20 DIAGNOSIS — G43909 Migraine, unspecified, not intractable, without status migrainosus: Secondary | ICD-10-CM | POA: Diagnosis not present

## 2021-07-20 DIAGNOSIS — M79606 Pain in leg, unspecified: Secondary | ICD-10-CM | POA: Diagnosis not present

## 2021-07-20 DIAGNOSIS — R42 Dizziness and giddiness: Secondary | ICD-10-CM | POA: Diagnosis not present

## 2021-07-20 DIAGNOSIS — E1140 Type 2 diabetes mellitus with diabetic neuropathy, unspecified: Secondary | ICD-10-CM | POA: Diagnosis not present

## 2021-07-20 DIAGNOSIS — G43909 Migraine, unspecified, not intractable, without status migrainosus: Secondary | ICD-10-CM | POA: Diagnosis not present

## 2021-07-20 DIAGNOSIS — G609 Hereditary and idiopathic neuropathy, unspecified: Secondary | ICD-10-CM | POA: Diagnosis not present

## 2021-08-05 LAB — BASIC METABOLIC PANEL
BUN: 11 (ref 4–21)
Creatinine: 1 (ref 0.5–1.1)
Glucose: 451

## 2021-08-05 LAB — HEPATIC FUNCTION PANEL
ALT: 53 — AB (ref 7–35)
AST: 22 (ref 13–35)

## 2021-08-05 LAB — VITAMIN D 25 HYDROXY (VIT D DEFICIENCY, FRACTURES): Vit D, 25-Hydroxy: 9

## 2021-08-05 LAB — COMPREHENSIVE METABOLIC PANEL: GFR calc non Af Amer: 70

## 2021-08-12 DIAGNOSIS — D6949 Other primary thrombocytopenia: Secondary | ICD-10-CM | POA: Diagnosis not present

## 2021-08-12 DIAGNOSIS — E039 Hypothyroidism, unspecified: Secondary | ICD-10-CM | POA: Diagnosis not present

## 2021-08-12 DIAGNOSIS — U071 COVID-19: Secondary | ICD-10-CM | POA: Diagnosis not present

## 2021-08-12 DIAGNOSIS — E1165 Type 2 diabetes mellitus with hyperglycemia: Secondary | ICD-10-CM | POA: Diagnosis not present

## 2021-08-30 DIAGNOSIS — M79606 Pain in leg, unspecified: Secondary | ICD-10-CM | POA: Diagnosis not present

## 2021-08-30 DIAGNOSIS — Z79891 Long term (current) use of opiate analgesic: Secondary | ICD-10-CM | POA: Diagnosis not present

## 2021-08-30 DIAGNOSIS — G43909 Migraine, unspecified, not intractable, without status migrainosus: Secondary | ICD-10-CM | POA: Diagnosis not present

## 2021-08-30 DIAGNOSIS — Z79899 Other long term (current) drug therapy: Secondary | ICD-10-CM | POA: Diagnosis not present

## 2021-08-30 DIAGNOSIS — R42 Dizziness and giddiness: Secondary | ICD-10-CM | POA: Diagnosis not present

## 2021-09-09 DIAGNOSIS — G5603 Carpal tunnel syndrome, bilateral upper limbs: Secondary | ICD-10-CM | POA: Diagnosis not present

## 2021-09-28 ENCOUNTER — Telehealth: Payer: Self-pay | Admitting: Nurse Practitioner

## 2021-09-28 ENCOUNTER — Ambulatory Visit (INDEPENDENT_AMBULATORY_CARE_PROVIDER_SITE_OTHER): Payer: BC Managed Care – PPO | Admitting: Nurse Practitioner

## 2021-09-28 ENCOUNTER — Encounter: Payer: Self-pay | Admitting: Nurse Practitioner

## 2021-09-28 ENCOUNTER — Other Ambulatory Visit: Payer: Self-pay

## 2021-09-28 VITALS — BP 134/72 | HR 81 | Ht 64.5 in | Wt 198.0 lb

## 2021-09-28 DIAGNOSIS — E1165 Type 2 diabetes mellitus with hyperglycemia: Secondary | ICD-10-CM

## 2021-09-28 LAB — POCT GLYCOSYLATED HEMOGLOBIN (HGB A1C): HbA1c, POC (controlled diabetic range): 12.8 % — AB (ref 0.0–7.0)

## 2021-09-28 MED ORDER — BLOOD GLUCOSE MONITOR KIT: PACK | 0 refills | Status: AC

## 2021-09-28 NOTE — Patient Instructions (Signed)
Diabetes Mellitus and Nutrition, Adult When you have diabetes, or diabetes mellitus, it is very important to have healthy eating habits because your blood sugar (glucose) levels are greatly affected by what you eat and drink. Eating healthy foods in the right amounts, at about the same times every day, can help you:  Control your blood glucose.  Lower your risk of heart disease.  Improve your blood pressure.  Reach or maintain a healthy weight. What can affect my meal plan? Every person with diabetes is different, and each person has different needs for a meal plan. Your health care provider may recommend that you work with a dietitian to make a meal plan that is best for you. Your meal plan may vary depending on factors such as:  The calories you need.  The medicines you take.  Your weight.  Your blood glucose, blood pressure, and cholesterol levels.  Your activity level.  Other health conditions you have, such as heart or kidney disease. How do carbohydrates affect me? Carbohydrates, also called carbs, affect your blood glucose level more than any other type of food. Eating carbs naturally raises the amount of glucose in your blood. Carb counting is a method for keeping track of how many carbs you eat. Counting carbs is important to keep your blood glucose at a healthy level, especially if you use insulin or take certain oral diabetes medicines. It is important to know how many carbs you can safely have in each meal. This is different for every person. Your dietitian can help you calculate how many carbs you should have at each meal and for each snack. How does alcohol affect me? Alcohol can cause a sudden decrease in blood glucose (hypoglycemia), especially if you use insulin or take certain oral diabetes medicines. Hypoglycemia can be a life-threatening condition. Symptoms of hypoglycemia, such as sleepiness, dizziness, and confusion, are similar to symptoms of having too much  alcohol.  Do not drink alcohol if: ? Your health care provider tells you not to drink. ? You are pregnant, may be pregnant, or are planning to become pregnant.  If you drink alcohol: ? Do not drink on an empty stomach. ? Limit how much you use to:  0-1 drink a day for women.  0-2 drinks a day for men. ? Be aware of how much alcohol is in your drink. In the [place] one drink equals one 12 oz bottle of beer (355 mL), one 5 oz glass of wine (148 mL), or one 1 oz glass of hard liquor (44 mL). ? Keep yourself hydrated with water, diet soda, or unsweetened iced tea.  Keep in mind that regular soda, juice, and other mixers may contain a lot of sugar and must be counted as carbs. What are tips for following this plan? Reading food labels  Start by checking the serving size on the "Nutrition Facts" label of packaged foods and drinks. The amount of calories, carbs, fats, and other nutrients listed on the label is based on one serving of the item. Many items contain more than one serving per package.  Check the total grams (g) of carbs in one serving. You can calculate the number of servings of carbs in one serving by dividing the total carbs by 15. For example, if a food has 30 g of total carbs per serving, it would be equal to 2 servings of carbs.  Check the number of grams (g) of saturated fats and trans fats in one serving. Choose foods that have   a low amount or none of these fats.  Check the number of milligrams (mg) of salt (sodium) in one serving. Most people should limit total sodium intake to less than 2,300 mg per day.  Always check the nutrition information of foods labeled as "low-fat" or "nonfat." These foods may be higher in added sugar or refined carbs and should be avoided.  Talk to your dietitian to identify your daily goals for nutrients listed on the label. Shopping  Avoid buying canned, pre-made, or processed foods. These foods tend to be high in fat, sodium, and added  sugar.  Shop around the outside edge of the grocery store. This is where you will most often find fresh fruits and vegetables, bulk grains, fresh meats, and fresh dairy. Cooking  Use low-heat cooking methods, such as baking, instead of high-heat cooking methods like deep frying.  Cook using healthy oils, such as olive, canola, or sunflower oil.  Avoid cooking with butter, cream, or high-fat meats. Meal planning  Eat meals and snacks regularly, preferably at the same times every day. Avoid going long periods of time without eating.  Eat foods that are high in fiber, such as fresh fruits, vegetables, beans, and whole grains. Talk with your dietitian about how many servings of carbs you can eat at each meal.  Eat 4-6 oz (112-168 g) of lean protein each day, such as lean meat, chicken, fish, eggs, or tofu. One ounce (oz) of lean protein is equal to: ? 1 oz (28 g) of meat, chicken, or fish. ? 1 egg. ?  cup (62 g) of tofu.  Eat some foods each day that contain healthy fats, such as avocado, nuts, seeds, and fish.   What foods should I eat? Fruits Berries. Apples. Oranges. Peaches. Apricots. Plums. Grapes. Mango. Papaya. Pomegranate. Kiwi. Cherries. Vegetables Lettuce. Spinach. Leafy greens, including kale, chard, collard greens, and mustard greens. Beets. Cauliflower. Cabbage. Broccoli. Carrots. Green beans. Tomatoes. Peppers. Onions. Cucumbers. Brussels sprouts. Grains Whole grains, such as whole-wheat or whole-grain bread, crackers, tortillas, cereal, and pasta. Unsweetened oatmeal. Quinoa. Brown or wild rice. Meats and other proteins Seafood. Poultry without skin. Lean cuts of poultry and beef. Tofu. Nuts. Seeds. Dairy Low-fat or fat-free dairy products such as milk, yogurt, and cheese. The items listed above may not be a complete list of foods and beverages you can eat. Contact a dietitian for more information. What foods should I avoid? Fruits Fruits canned with  syrup. Vegetables Canned vegetables. Frozen vegetables with butter or cream sauce. Grains Refined white flour and flour products such as bread, pasta, snack foods, and cereals. Avoid all processed foods. Meats and other proteins Fatty cuts of meat. Poultry with skin. Breaded or fried meats. Processed meat. Avoid saturated fats. Dairy Full-fat yogurt, cheese, or milk. Beverages Sweetened drinks, such as soda or iced tea. The items listed above may not be a complete list of foods and beverages you should avoid. Contact a dietitian for more information. Questions to ask a health care provider  Do I need to meet with a diabetes educator?  Do I need to meet with a dietitian?  What number can I call if I have questions?  When are the best times to check my blood glucose? Where to find more information:  American Diabetes Association: diabetes.org  Academy of Nutrition and Dietetics: www.eatright.org  National Institute of Diabetes and Digestive and Kidney Diseases: www.niddk.nih.gov  Association of Diabetes Care and Education Specialists: www.diabeteseducator.org Summary  It is important to have healthy eating   habits because your blood sugar (glucose) levels are greatly affected by what you eat and drink.  A healthy meal plan will help you control your blood glucose and maintain a healthy lifestyle.  Your health care provider may recommend that you work with a dietitian to make a meal plan that is best for you.  Keep in mind that carbohydrates (carbs) and alcohol have immediate effects on your blood glucose levels. It is important to count carbs and to use alcohol carefully. This information is not intended to replace advice given to you by your health care provider. Make sure you discuss any questions you have with your health care provider. Document Revised: 10/29/2019 Document Reviewed: 10/29/2019 Elsevier Patient Education  2021 Elsevier Inc.  

## 2021-09-28 NOTE — Progress Notes (Signed)
Endocrinology Consult Note       09/28/2021, 11:49 AM   Subjective:    Patient ID: [name], female    DOB: March 06, 1971.  [name] is being seen in consultation for management of currently uncontrolled symptomatic diabetes requested by  Redmond School, MD.   Past Medical History:  Diagnosis Date   BV (bacterial vaginosis)    BV   Cancer ([place]) 2009   papillary thyroid cancer   Diabetes mellitus without complication ([place])    Family history of breast cancer    Hyperlipidemia    Hypertension    IBS (irritable bowel syndrome)    Thyroid disease     Past Surgical History:  Procedure Laterality Date   ABLATION     CARPAL TUNNEL RELEASE     CESAREAN SECTION     NECK AND CHEST LESION     THYROIDECTOMY     Right and Left, 05/2008, 09/2008    Social History   Socioeconomic History   Marital status: Married    Spouse name: Not on file   Number of children: Not on file   Years of education: Not on file   Highest education level: Not on file  Occupational History   Not on file  Tobacco Use   Smoking status: Former   Smokeless tobacco: Never  Vaping Use   Vaping Use: Never used  Substance and Sexual Activity   Alcohol use: Yes    Comment: occ   Drug use: Never   Sexual activity: Yes    Comment: ablation  Other Topics Concern   Not on file  Social History Narrative   Not on file   Social Determinants of Health   Financial Resource Strain: Not on file  Food Insecurity: Not on file  Transportation Needs: Not on file  Physical Activity: Not on file  Stress: Not on file  Social Connections: Not on file    Family History  Problem Relation Age of Onset   Breast cancer Mother        died at age 40   Mental illness Mother    Hypertension Father    Hyperlipidemia Father    Diabetes Father    Breast cancer Maternal Grandmother        died in her late 31s   Lung cancer Maternal  Uncle     Outpatient Encounter Medications as of 09/28/2021  Medication Sig   ALPRAZolam (XANAX) 0.5 MG tablet Take 0.5 mg by mouth 2 (two) times daily as needed.   atorvastatin (LIPITOR) 10 MG tablet Take 10 mg by mouth daily.   benzonatate (TESSALON) 200 MG capsule Take 200 mg by mouth 3 (three) times daily.   butalbital-acetaminophen-caffeine (FIORICET) 50-325-40 MG tablet Take 1 tablet by mouth 2 (two) times daily as needed.   diazepam (VALIUM) 2 MG tablet Take 2 mg by mouth 2 (two) times daily as needed.   Diethylpropion HCl 25 MG TABS Take 1 tablet by mouth 3 (three) times daily.   DULoxetine (CYMBALTA) 30 MG capsule Take 30 mg by mouth 2 (two) times daily.   fenofibrate micronized (LOFIBRA) 200 MG capsule Take 200 mg by mouth daily before breakfast.  fluconazole (DIFLUCAN) 100 MG tablet Take 100 mg by mouth daily.   gabapentin (NEURONTIN) 800 MG tablet Take 800 mg by mouth 4 (four) times daily.   Galcanezumab-gnlm (EMGALITY) 120 MG/ML SOAJ Inject 1 mL every month by subcutaneous route.   glimepiride (AMARYL) 4 MG tablet Take 4 mg by mouth daily.   HYDROcodone-acetaminophen (NORCO/VICODIN) 5-325 MG tablet Take 1 tablet by mouth 2 (two) times daily as needed.   hydrOXYzine (ATARAX/VISTARIL) 25 MG tablet Take 25 mg by mouth every 4 (four) hours as needed.   levothyroxine (SYNTHROID) 200 MCG tablet Take 200 mcg by mouth daily.   lisinopril (ZESTRIL) 2.5 MG tablet Take 2.5 mg by mouth daily.   meclizine (ANTIVERT) 25 MG tablet Take 25 mg by mouth 4 (four) times daily as needed.   Omega 3 1000 MG CAPS 1 capsule   scopolamine (TRANSDERM-SCOP) 1 MG/3DAYS scopolamine 1 mg over 3 days transdermal patch  Apply 1 patch by transdermal route as directed.  Replace 1 every 3 days   traMADol (ULTRAM) 50 MG tablet Take 100 mg by mouth 3 (three) times daily as needed.   Vitamin D, Ergocalciferol, (DRISDOL) 1.25 MG (50000 UNIT) CAPS capsule Take 50,000 Units by mouth once a week.   [DISCONTINUED]  citalopram (CELEXA) 20 MG tablet Take 20 mg by mouth daily.    [DISCONTINUED] SYNTHROID 175 MCG tablet Take 175 mcg by mouth daily before breakfast.  (Patient not taking: Reported on 09/28/2021)   [DISCONTINUED] topiramate (TOPAMAX) 25 MG tablet TAKE 1 TABLET TWICE A DAY (Patient not taking: Reported on 09/28/2021)   No facility-administered encounter medications on file as of 09/28/2021.    ALLERGIES: Allergies  Allergen Reactions   Aspirin     Up set stomach Other reaction(s): stomach upset   Elemental Sulfur Hives   Sulfur Dioxide Hives   Sulfamethoxazole-Trimethoprim Rash    VACCINATION STATUS:  There is no immunization history on file for this patient.  Diabetes She presents for her initial diabetic visit. She has type 2 diabetes mellitus. Onset time: had never been formally diagnosed- has gest DM with both children- started on meds about 1 year ago at age of 50. Her disease course has been worsening. There are no hypoglycemic associated symptoms. Associated symptoms include blurred vision, fatigue, foot paresthesias, polydipsia and polyuria. There are no hypoglycemic complications. Symptoms are stable. Diabetic complications include nephropathy and peripheral neuropathy. Risk factors for coronary artery disease include diabetes mellitus, dyslipidemia, family history, obesity, hypertension and sedentary lifestyle. Current diabetic treatment includes oral agent (monotherapy) (Glimepiride 4 mg po nightly-). She is compliant with treatment most of the time. Her weight is stable. She is following a generally unhealthy diet. When asked about meal planning, she reported none. She has not had a previous visit with a dietitian. She rarely participates in exercise. (She presents today, accompanied by her daughter, for her consultation with no meter or logs to review.  Her POCT A1c today is 12.8%.  She admittedly does not check her glucose routinely (only checks it once every 2 weeks or so).  She  drinks mostly diet soda, coffee with SF creamer, and water with SF flavorings and typically skips breakfast on most days.  She snacks occasionally.  She does not engage in routine physical activity due to her dizziness and risk for falls.  She has not had eye exam in approximately 6 years and does not see podiatrist.  She has been on Metformin in the past and did not tolerate either form due to  GI complaints.) An ACE inhibitor/angiotensin II receptor blocker is not being taken. She does not see a podiatrist.Eye exam is current.    Review of systems  Constitutional: + Minimally fluctuating body weight, current Body mass index is 33.46 kg/m., + fatigue, no subjective hyperthermia, no subjective hypothermia Eyes: + blurry vision, no xerophthalmia ENT: no sore throat, no nodules palpated in throat, no dysphagia/odynophagia, no hoarseness Cardiovascular: no chest pain, no shortness of breath, no palpitations, no leg swelling Respiratory: no cough, no shortness of breath Gastrointestinal: no nausea/vomiting/diarrhea Musculoskeletal: no muscle/joint aches Skin: no rashes, no hyperemia Neurological: no tremors, + numbness/tingling to BLE, + dizziness with positional changes Psychiatric: no depression, no anxiety  Objective:     BP 134/72   Pulse 81   Ht 5' 4.5" (1.638 m)   Wt 198 lb (89.8 kg)   BMI 33.46 kg/m   Wt Readings from Last 3 Encounters:  09/28/21 198 lb (89.8 kg)  11/20/20 206 lb (93.4 kg)  10/26/18 212 lb 8 oz (96.4 kg)     BP Readings from Last 3 Encounters:  09/28/21 134/72  12/25/20 137/85  11/20/20 119/60     Physical Exam- Limited  Constitutional:  Body mass index is 33.46 kg/m. , not in acute distress, normal state of mind Eyes:  EOMI, no exophthalmos Neck: Supple Cardiovascular: RRR, no murmurs, rubs, or gallops, no edema Respiratory: Adequate breathing efforts, no crackles, rales, rhonchi, or wheezing Musculoskeletal: no gross deformities, strength intact in  all four extremities, no gross restriction of joint movements Skin:  no rashes, no hyperemia Neurological: no tremor with outstretched hands    CMP ( most recent) CMP     Component Value Date/Time   NA 140 05/18/2011 0856   K 4.0 05/18/2011 0856   CL 105 05/18/2011 0856   CO2 25 05/18/2011 0856   GLUCOSE 134 (H) 05/18/2011 0856   BUN 11 08/05/2021 0000   CREATININE 1.0 08/05/2021 0000   CREATININE 0.67 05/18/2011 0856   CALCIUM 8.1 (L) 05/18/2011 0856   PROT 6.8 07/18/2008 1120   ALBUMIN 4.1 07/18/2008 1120   AST 22 08/05/2021 0000   ALT 53 (A) 08/05/2021 0000   ALKPHOS 54 07/18/2008 1120   BILITOT 0.7 07/18/2008 1120   GFRNONAA 70 08/05/2021 0000   GFRAA >60 05/18/2011 0856     Diabetic Labs (most recent): Lab Results  Component Value Date   HGBA1C 12.8 (A) 09/28/2021   HGBA1C 9.6 (H) 10/26/2018     Lipid Panel ( most recent) Lipid Panel  No results found for: CHOL, TRIG, HDL, CHOLHDL, VLDL, LDLCALC, LDLDIRECT, LABVLDL    Lab Results  Component Value Date   TSH 2.639 10/18/2013           Assessment & Plan:   1) Uncontrolled Type 2 Diabetes with hyperglycemia  She presents today, accompanied by her daughter, for her consultation with no meter or logs to review.  Her POCT A1c today is 12.8%.  She admittedly does not check her glucose routinely (only checks it once every 2 weeks or so).  She drinks mostly diet soda, coffee with SF creamer, and water with SF flavorings and typically skips breakfast on most days.  She snacks occasionally.  She does not engage in routine physical activity due to her dizziness and risk for falls.  She has not had eye exam in approximately 6 years and does not see podiatrist.  She has been on Metformin in the past and did not tolerate either form due to  GI complaints.  - [name] has currently uncontrolled symptomatic type 2 DM since 50 years of age, with most recent A1c of 12.8 %.   -Recent labs reviewed.  - I had a long  discussion with her about the progressive nature of diabetes and the pathology behind its complications. -her diabetes is complicated by neuropathy and she remains at a high risk for more acute and chronic complications which include CAD, CVA, CKD, retinopathy, and neuropathy. These are all discussed in detail with her.  - I have counseled her on diet and weight management by adopting a carbohydrate restricted/protein rich diet. Patient is encouraged to switch to unprocessed or minimally processed complex starch and increased protein intake (animal or plant source), fruits, and vegetables. -  she is advised to stick to a routine mealtimes to eat 3 meals a day and avoid unnecessary snacks (to snack only to correct hypoglycemia).   - she acknowledges that there is a room for improvement in her food and drink choices. - Suggestion is made for her to avoid simple carbohydrates from her diet including Cakes, Sweet Desserts, Ice Cream, Soda (diet and regular), Sweet Tea, Candies, Chips, Cookies, Store Bought Juices, Alcohol in Excess of 1-2 drinks a day, Artificial Sweeteners, Coffee Creamer, and "Sugar-free" Products. This will help patient to have more stable blood glucose profile and potentially avoid unintended weight gain.  - she will be scheduled with Jearld Fenton, RDN, CDE for diabetes education.  - I have approached her with the following individualized plan to manage her diabetes and patient agrees:    -she is encouraged to start monitoring glucose 4 times daily, before meals and before bed, to log their readings on the clinic sheets provided, and bring them to review at follow up appointment in 2 weeks.    - she is warned not to take insulin without proper monitoring per orders. - Adjustment parameters are given to her for hypo and hyperglycemia in writing. - she is encouraged to call clinic for blood glucose levels less than 70 or above 300 mg /dl. - she is advised to continue Glimepiride 4  mg po daily (but change it to morning administration rather than bedtime), therapeutically suitable for patient .  She is aware that she may need insulin therapy to regain control of her diabetes and is open to the idea.  - she will be considered for incretin therapy as appropriate next visit.  - Specific targets for  A1c; LDL, HDL, and Triglycerides were discussed with the patient.  2) Blood Pressure /Hypertension:  her blood pressure is controlled to target.   she is advised to continue her current medications including Lisinopril 2.5 mg p.o. daily with breakfast.  3) Lipids/Hyperlipidemia:    There is no recent lipid panel available to review.  she is advised to continue Lipitor 10 mg daily at bedtime and Fenofibrate 200 mg po daily.  Side effects and precautions discussed with her.  4)  Weight/Diet:  her Body mass index is 33.46 kg/m.  -  clearly complicating her diabetes care.   she is a candidate for weight loss. I discussed with her the fact that loss of 5 - 10% of her  current body weight will have the most impact on her diabetes management.  Exercise, and detailed carbohydrates information provided  -  detailed on discharge instructions.  5) Chronic Care/Health Maintenance: -she is on ACEI/ARB and Statin medications and is encouraged to initiate and continue to follow up with Ophthalmology, Dentist,  Podiatrist at least yearly or according to recommendations, and advised to stay away from smoking. I have recommended yearly flu vaccine and pneumonia vaccine at least every 5 years; moderate intensity exercise for up to 150 minutes weekly; and sleep for at least 7 hours a day.  - she is advised to maintain close follow up with Redmond School, MD for primary care needs, as well as her other providers for optimal and coordinated care.   - Time spent in this patient care: 60 min, of which > 50% was spent in counseling her about her diabetes and the rest reviewing her blood glucose logs,  discussing her hypoglycemia and hyperglycemia episodes, reviewing her current and previous labs/studies (including abstraction from other facilities) and medications doses and developing a long term treatment plan based on the latest standards of care/guidelines; and documenting her care.    Please refer to Patient Instructions for Blood Glucose Monitoring and Insulin/Medications Dosing Guide" in media tab for additional information. Please also refer to "Patient Self Inventory" in the Media tab for reviewed elements of pertinent patient history.  Christy Sartorius participated in the discussions, expressed understanding, and voiced agreement with the above plans.  All questions were answered to her satisfaction. she is encouraged to contact clinic should she have any questions or concerns prior to her return visit.     Follow up plan: - Return in about 2 weeks (around 10/12/2021) for Diabetes F/U, Bring meter and logs.    Rayetta Pigg, [facility] [address] [place], [place] [address] Phone: [phone] Fax: [phone]  09/28/2021, 11:49 AM

## 2021-09-28 NOTE — Telephone Encounter (Signed)
Pt was seen this morning and when she got home she realized she does need meter and testing supplies. Her insurance is requiring these be sent to [facility]

## 2021-10-13 NOTE — Patient Instructions (Incomplete)

## 2021-10-14 ENCOUNTER — Ambulatory Visit: Payer: BC Managed Care – PPO | Admitting: Nurse Practitioner

## 2021-10-14 DIAGNOSIS — E1165 Type 2 diabetes mellitus with hyperglycemia: Secondary | ICD-10-CM

## 2021-11-08 IMAGING — MG DIGITAL SCREENING BILAT W/ TOMO W/ CAD
8 series · 8 of 24 positions shown · non-contrast
Comparison: Previous exam(s).

CLINICAL DATA: Screening.

EXAM:
DIGITAL SCREENING BILATERAL MAMMOGRAM WITH TOMO AND CAD

[R CC synth-2D]
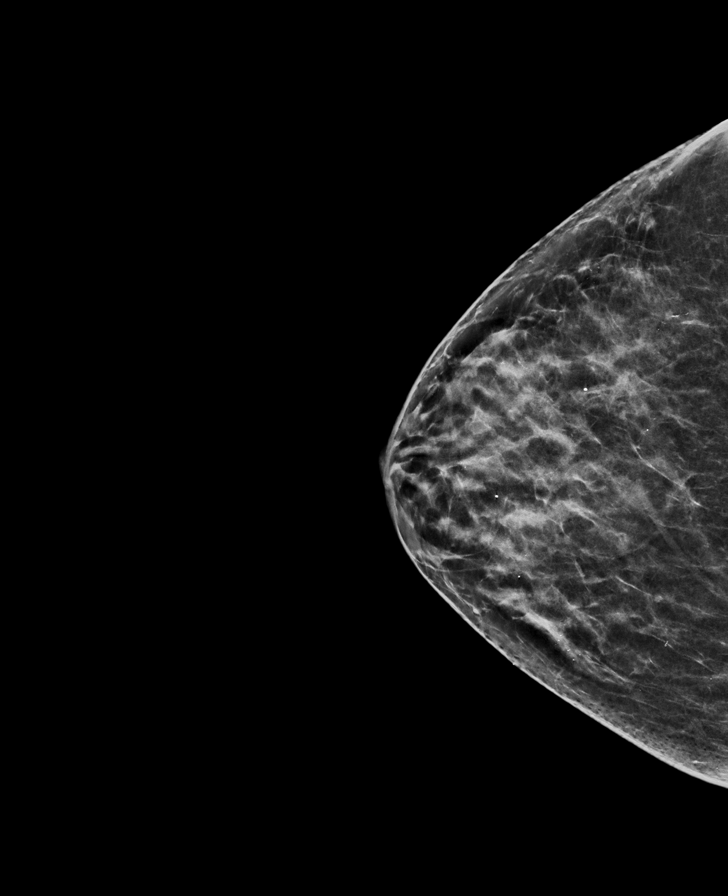

[L MLO synth-2D]
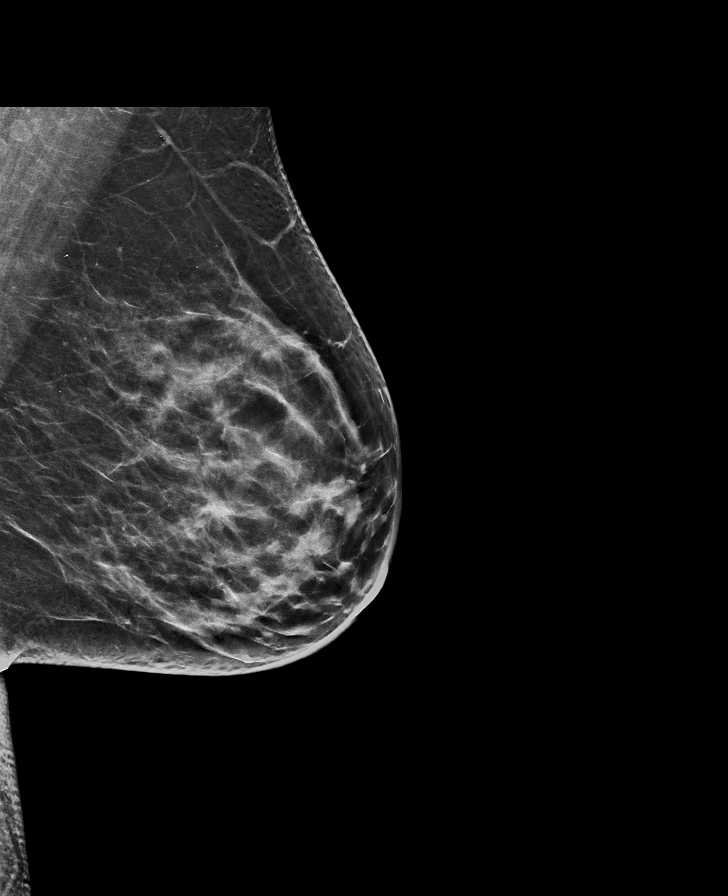

[L CC synth-2D]
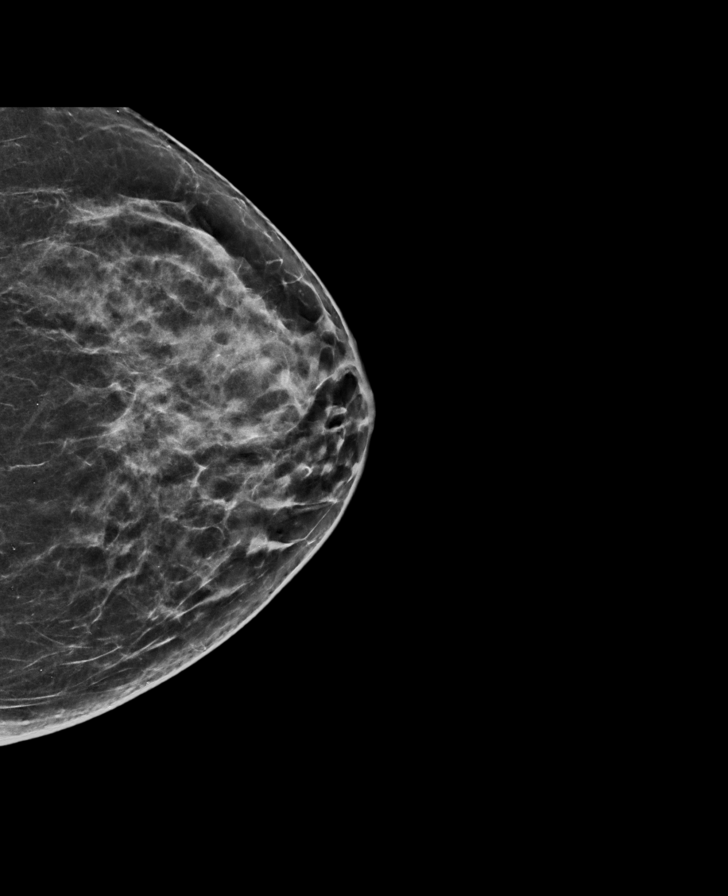

[R MLO synth-2D]
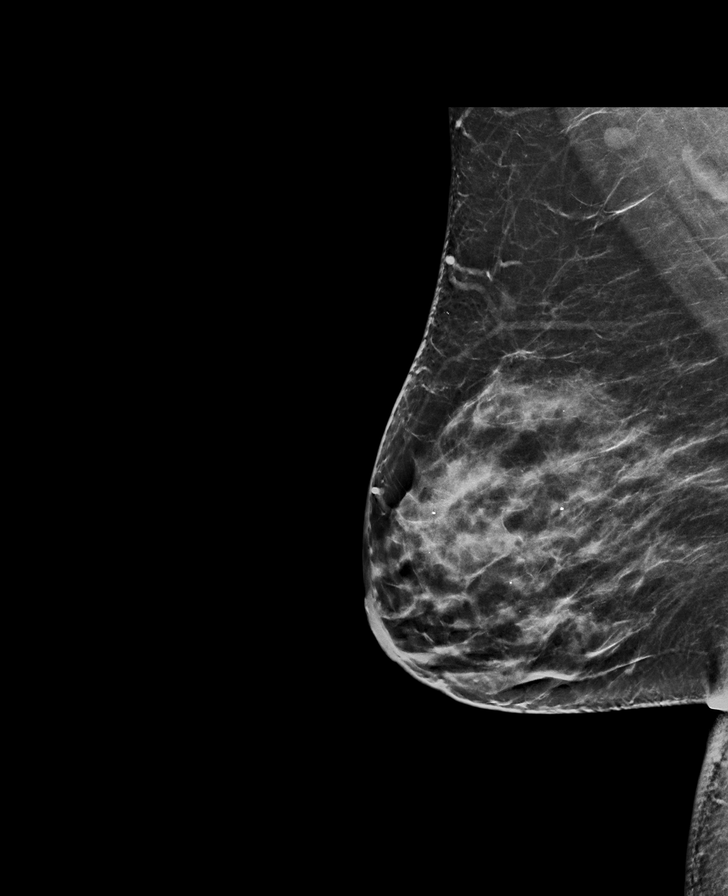

[L CC tomo · tomo slice 33/65.0]
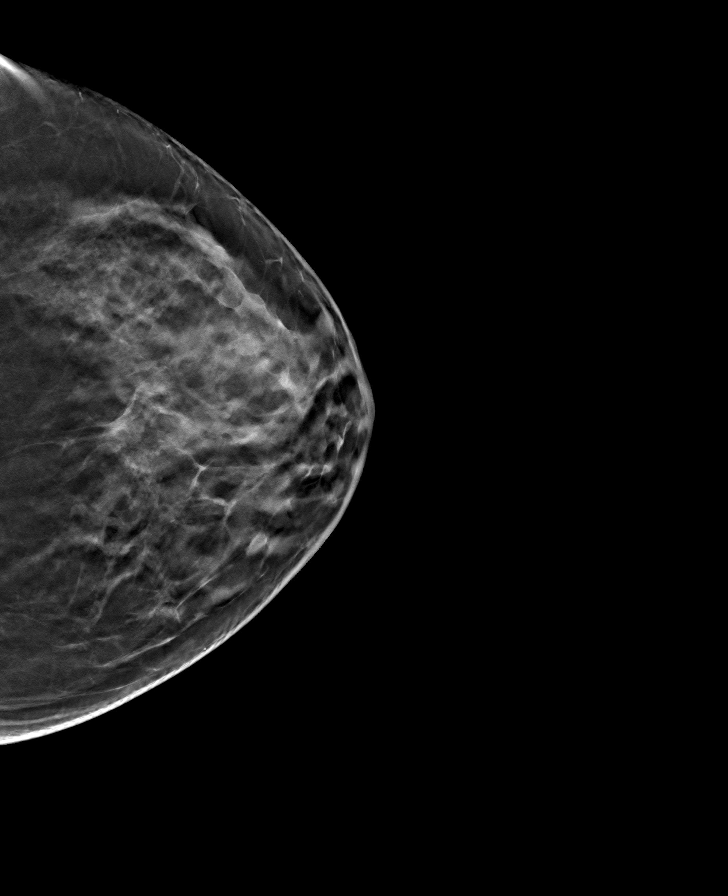

[R MLO tomo · tomo slice 37/73.0]
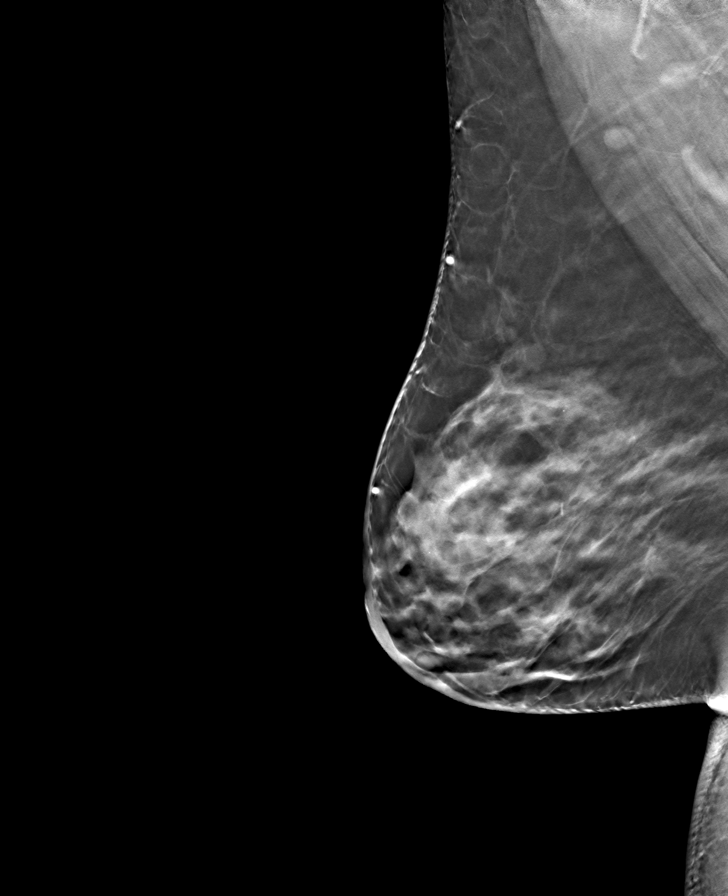

[L MLO tomo · tomo slice 37/74.0]
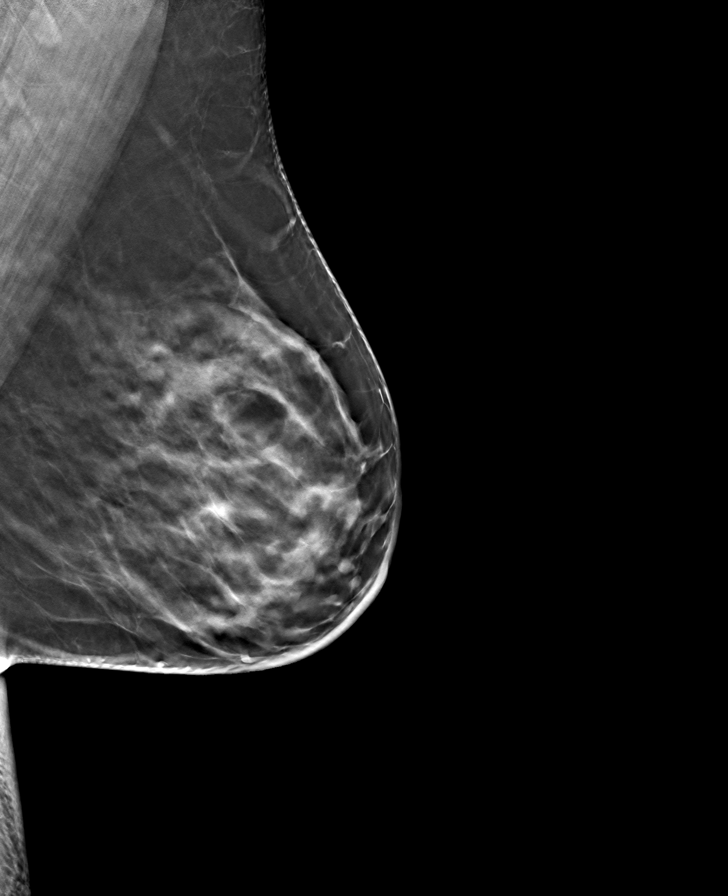

[R CC tomo · tomo slice 32/63.0]
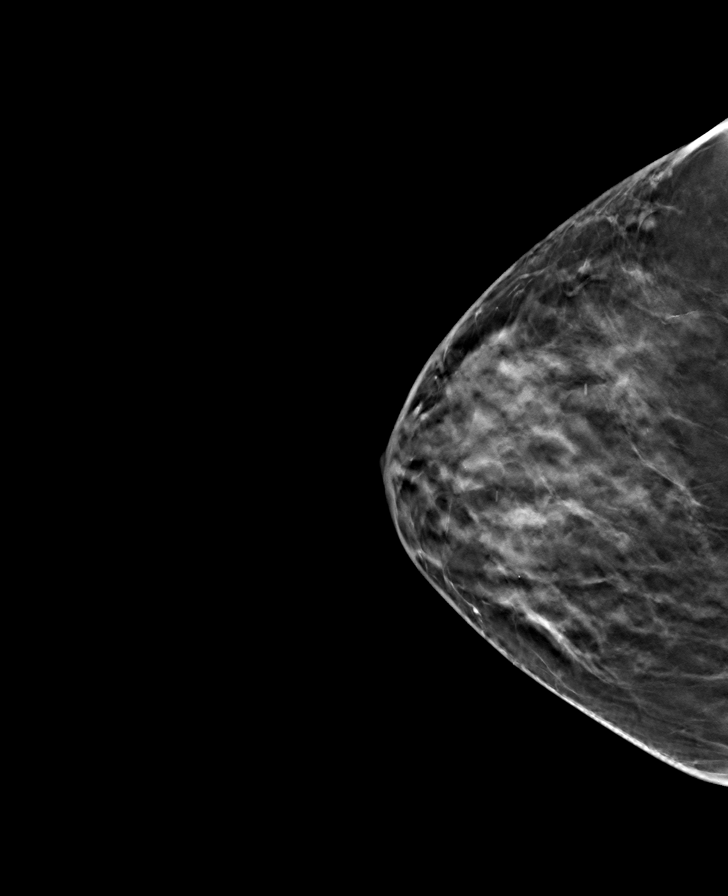

[8 of 24 positions shown; findings below may reference images not displayed]

ACR Breast Density Category c: The breast tissue is heterogeneously
dense, which may obscure small masses.
FINDINGS: In the left breast, a possible asymmetry warrants further
evaluation. In the right breast, no findings suspicious for
malignancy. Images were processed with CAD.
IMPRESSION: Further evaluation is suggested for possible asymmetry in the left
breast.

RECOMMENDATION:
Diagnostic mammogram and possibly ultrasound of the left breast.
(Code:[id number])

The patient will be contacted regarding the findings, and additional
imaging will be scheduled.

BI-RADS CATEGORY  0: Incomplete. Need additional imaging evaluation
and/or prior mammograms for comparison.

## 2021-12-01 DIAGNOSIS — G43909 Migraine, unspecified, not intractable, without status migrainosus: Secondary | ICD-10-CM | POA: Diagnosis not present

## 2021-12-01 DIAGNOSIS — Z79899 Other long term (current) drug therapy: Secondary | ICD-10-CM | POA: Diagnosis not present

## 2021-12-01 DIAGNOSIS — M79606 Pain in leg, unspecified: Secondary | ICD-10-CM | POA: Diagnosis not present

## 2021-12-01 DIAGNOSIS — Z79891 Long term (current) use of opiate analgesic: Secondary | ICD-10-CM | POA: Diagnosis not present

## 2021-12-01 DIAGNOSIS — G609 Hereditary and idiopathic neuropathy, unspecified: Secondary | ICD-10-CM | POA: Diagnosis not present

## 2021-12-14 ENCOUNTER — Ambulatory Visit: Payer: BC Managed Care – PPO | Admitting: Nurse Practitioner

## 2021-12-14 NOTE — Patient Instructions (Incomplete)

## 2021-12-20 ENCOUNTER — Ambulatory Visit: Payer: BC Managed Care – PPO | Admitting: Nurse Practitioner

## 2021-12-20 NOTE — Patient Instructions (Incomplete)
Diabetes Mellitus Emergency Preparedness Plan A diabetes emergency preparedness plan is a checklist to make sure you have everything you need to manage your diabetes in case of an emergency, such as an evacuation, natural disaster, national security emergency, or pandemic lockdown. Managing your diabetes is something you have to do all day every day. The American Diabetes Association and the [employer] of Endocrinology both recommend putting together an emergency diabetes kit. Your kit should include important information and documents as well as all the supplies you will need to manage your diabetes for at least 1 week. Store it in a portable, Engineer, materials. The best time to start making your emergency kit is now. How to make your emergency kit Collect information and documents Include the following information and documents in your kit: The type of diabetes you have. A copy of your health insurance cards and photo ID. A list of all your other medical conditions, allergies, and surgeries. A list of all your medicines and doses with the contact information for your pharmacy. Ask your health care provider for a list of your current medicines. Any recent lab results, including your latest hemoglobin A1C (HbA1C). The make, model, and serial number of your insulin pump, if you use one. Also include contact information for the manufacturer. Contact information for people who should be notified in case of an emergency. Include your health care provider's name, address, and phone number. Collect diabetes care items Include the following diabetes care items in your kit: At least a 1-week supply of: Oral medicines. Insulin. Blood glucose testing supplies. These include testing strips, lancets, and extra batteries for your blood glucose monitor and pump. A charger for the continuous glucose monitor (CGM) receiver and pump. Any extra supplies needed for your CGM or pump. A supply of  glucagon, glucose tablets, juice, soda, or hard candy in case of hypoglycemia. Coolers or cold packs. A safe container for syringes, needles, and lancets.  Other preparations Other things to consider doing as part of your emergency plan: Make sure that your mobile phone is charged and that you have an extra charger, cable, or batteries. Choose a meeting place for family members. Wear a medical alert or ID bracelet. If you have a child with diabetes, make sure your child's school has a copy of his or her emergency plan, including the name of the staff member who will assist your child. Where to find more information American Diabetes Association: www.diabetes.org Centers for Disease Control and Prevention: blogs.[employer].com.cy Summary A diabetes emergency preparedness plan is a checklist to make sure you have everything you need in case of an emergency. Your kit should include important information and documents as well as all the supplies you will need to manage your condition for at least 1 week. Store your kit in a portable, Engineer, materials. The best time to start making your emergency kit is now. This information is not intended to replace advice given to you by your health care provider. Make sure you discuss any questions you have with your health care provider. Document Revised: 05/28/2020 Document Reviewed: 05/28/2020 Elsevier Patient Education  [place].

## 2021-12-23 NOTE — Patient Instructions (Signed)

## 2021-12-24 ENCOUNTER — Other Ambulatory Visit: Payer: Self-pay

## 2021-12-24 ENCOUNTER — Encounter: Payer: Self-pay | Admitting: Nurse Practitioner

## 2021-12-24 ENCOUNTER — Ambulatory Visit (INDEPENDENT_AMBULATORY_CARE_PROVIDER_SITE_OTHER): Payer: BC Managed Care – PPO | Admitting: Nurse Practitioner

## 2021-12-24 VITALS — BP 129/79 | HR 94 | Ht 64.5 in | Wt 198.2 lb

## 2021-12-24 DIAGNOSIS — E1165 Type 2 diabetes mellitus with hyperglycemia: Secondary | ICD-10-CM

## 2021-12-24 LAB — POCT UA - MICROALBUMIN
Creatinine, POC: 50 mg/dL
Microalbumin Ur, POC: 10 mg/L

## 2021-12-24 MED ORDER — GLIMEPIRIDE 4 MG PO TABS: 4.0000 mg | ORAL_TABLET | Freq: Every day | ORAL | 3 refills | Status: DC

## 2021-12-24 MED ORDER — TOUJEO MAX SOLOSTAR 300 UNIT/ML ~~LOC~~ SOPN: 18.0000 [IU] | PEN_INJECTOR | Freq: Every evening | SUBCUTANEOUS | 3 refills | Status: DC

## 2021-12-24 MED ORDER — PEN NEEDLES 31G X 8 MM MISC: 6 refills | Status: DC

## 2021-12-24 MED ORDER — LEVOTHYROXINE SODIUM 150 MCG PO TABS: 150.0000 ug | ORAL_TABLET | Freq: Every day | ORAL | 1 refills | Status: DC

## 2021-12-24 NOTE — Progress Notes (Signed)
Endocrinology Follow Up Note       12/24/2021, 10:28 AM   Subjective:    Patient ID: [name], female    DOB: 03-11-1971.  [name] is being seen in follow up after being seen in consultation for management of currently uncontrolled symptomatic diabetes requested by  Redmond School, MD.   Past Medical History:  Diagnosis Date   BV (bacterial vaginosis)    BV   Cancer ([place]) 2009   papillary thyroid cancer   Diabetes mellitus without complication ([place])    Family history of breast cancer    Hyperlipidemia    Hypertension    IBS (irritable bowel syndrome)    Thyroid disease     Past Surgical History:  Procedure Laterality Date   ABLATION     CARPAL TUNNEL RELEASE     CESAREAN SECTION     NECK AND CHEST LESION     THYROIDECTOMY     Right and Left, 05/2008, 09/2008    Social History   Socioeconomic History   Marital status: Married    Spouse name: Not on file   Number of children: Not on file   Years of education: Not on file   Highest education level: Not on file  Occupational History   Not on file  Tobacco Use   Smoking status: Former   Smokeless tobacco: Never  Vaping Use   Vaping Use: Never used  Substance and Sexual Activity   Alcohol use: Yes    Comment: occ   Drug use: Never   Sexual activity: Yes    Comment: ablation  Other Topics Concern   Not on file  Social History Narrative   Not on file   Social Determinants of Health   Financial Resource Strain: Not on file  Food Insecurity: Not on file  Transportation Needs: Not on file  Physical Activity: Not on file  Stress: Not on file  Social Connections: Not on file    Family History  Problem Relation Age of Onset   Breast cancer Mother        died at age 28   Mental illness Mother    Hypertension Father    Hyperlipidemia Father    Diabetes Father    Breast cancer Maternal Grandmother        died in her late  45s   Lung cancer Maternal Uncle     Outpatient Encounter Medications as of 12/24/2021  Medication Sig   ALPRAZolam (XANAX) 0.5 MG tablet Take 0.5 mg by mouth 2 (two) times daily as needed.   atorvastatin (LIPITOR) 10 MG tablet Take 10 mg by mouth daily.   blood glucose meter kit and supplies KIT Dispense based on patient and insurance preference. Use up to four times daily as directed.   butalbital-acetaminophen-caffeine (FIORICET) 50-325-40 MG tablet Take 1 tablet by mouth 2 (two) times daily as needed.   diazepam (VALIUM) 2 MG tablet Take 2 mg by mouth 2 (two) times daily as needed.   Diethylpropion HCl 25 MG TABS Take 1 tablet by mouth 3 (three) times daily.   DULoxetine (CYMBALTA) 30 MG capsule Take 30 mg by mouth 2 (two) times daily.  fenofibrate micronized (LOFIBRA) 200 MG capsule Take 200 mg by mouth daily before breakfast.   fluconazole (DIFLUCAN) 100 MG tablet Take 100 mg by mouth daily.   gabapentin (NEURONTIN) 800 MG tablet Take 800 mg by mouth 4 (four) times daily.   Galcanezumab-gnlm (EMGALITY) 120 MG/ML SOAJ Inject 1 mL every month by subcutaneous route.   HYDROcodone-acetaminophen (NORCO) 7.5-325 MG tablet Take 1 tablet by mouth every 8 (eight) hours as needed.   hydrOXYzine (ATARAX/VISTARIL) 25 MG tablet Take 25 mg by mouth every 4 (four) hours as needed.   insulin glargine, 2 Unit Dial, (TOUJEO MAX SOLOSTAR) 300 UNIT/ML Solostar Pen Inject 18 Units into the skin at bedtime.   Insulin Pen Needle (PEN NEEDLES) 31G X 8 MM MISC Use to inject insulin once daily   Lancets (ONETOUCH DELICA PLUS [id number]) MISC Apply 1 each topically 4 (four) times daily.   levothyroxine (SYNTHROID) 150 MCG tablet Take 1 tablet (150 mcg total) by mouth daily.   lisinopril (ZESTRIL) 2.5 MG tablet Take 2.5 mg by mouth daily.   meclizine (ANTIVERT) 25 MG tablet Take 25 mg by mouth 4 (four) times daily as needed.   Omega 3 1000 MG CAPS 1 capsule   ONETOUCH VERIO test strip 1 each 4 (four) times daily.    scopolamine (TRANSDERM-SCOP) 1 MG/3DAYS scopolamine 1 mg over 3 days transdermal patch  Apply 1 patch by transdermal route as directed.  Replace 1 every 3 days   traMADol (ULTRAM) 50 MG tablet Take 100 mg by mouth 3 (three) times daily as needed.   Vitamin D, Ergocalciferol, (DRISDOL) 1.25 MG (50000 UNIT) CAPS capsule Take 50,000 Units by mouth once a week.   [DISCONTINUED] glimepiride (AMARYL) 4 MG tablet Take 4 mg by mouth daily.   [DISCONTINUED] levothyroxine (SYNTHROID) 200 MCG tablet Take 200 mcg by mouth daily.   glimepiride (AMARYL) 4 MG tablet Take 1 tablet (4 mg total) by mouth daily.   [DISCONTINUED] benzonatate (TESSALON) 200 MG capsule Take 200 mg by mouth 3 (three) times daily. (Patient not taking: Reported on 12/24/2021)   [DISCONTINUED] HYDROcodone-acetaminophen (NORCO/VICODIN) 5-325 MG tablet Take 1 tablet by mouth 2 (two) times daily as needed. (Patient not taking: Reported on 12/24/2021)   No facility-administered encounter medications on file as of 12/24/2021.    ALLERGIES: Allergies  Allergen Reactions   Aspirin     Up set stomach Other reaction(s): stomach upset   Elemental Sulfur Hives   Sulfur Dioxide Hives   Sulfamethoxazole-Trimethoprim Rash    VACCINATION STATUS:  There is no immunization history on file for this patient.  Diabetes She presents for her follow-up diabetic visit. She has type 2 diabetes mellitus. Onset time: had never been formally diagnosed- has gest DM with both children- started on meds about 1 year ago at age of 19. Her disease course has been stable. There are no hypoglycemic associated symptoms. Associated symptoms include blurred vision, fatigue, foot paresthesias, polydipsia and polyuria. There are no hypoglycemic complications. Symptoms are stable. Diabetic complications include nephropathy and peripheral neuropathy. Risk factors for coronary artery disease include diabetes mellitus, dyslipidemia, family history, obesity, hypertension  and sedentary lifestyle. Current diabetic treatment includes oral agent (monotherapy). She is compliant with treatment most of the time. Her weight is stable. She is following a generally unhealthy diet. When asked about meal planning, she reported none. She has not had a previous visit with a dietitian. She rarely participates in exercise. Her home blood glucose trend is fluctuating minimally. Her breakfast blood glucose range is  generally >200 mg/dl. Her lunch blood glucose range is generally >200 mg/dl. Her dinner blood glucose range is generally >200 mg/dl. Her bedtime blood glucose range is generally >200 mg/dl. Her overall blood glucose range is >200 mg/dl. (She presents today, accompanied by her daughter, with her logs, no meter, showing persistently high glycemic profile overall.  She was not due for another A1c today.  She reports she has gotten better with her eating pattern, but that she is struggling with eating healthy all the time.  She also admits to binge eating at night.  She denies any hypoglycemia.) An ACE inhibitor/angiotensin II receptor blocker is not being taken. She does not see a podiatrist.Eye exam is current.    Review of systems  Constitutional: + Minimally fluctuating body weight,  current Body mass index is 33.5 kg/m. , no fatigue, no subjective hyperthermia, no subjective hypothermia Eyes: no blurry vision, no xerophthalmia ENT: no sore throat, no nodules palpated in throat, no dysphagia/odynophagia, no hoarseness Cardiovascular: no chest pain, no shortness of breath, no palpitations, no leg swelling Respiratory: no cough, no shortness of breath Gastrointestinal: no nausea/vomiting/diarrhea Musculoskeletal: no muscle/joint aches Skin: no rashes, no hyperemia Neurological: no tremors, no numbness, no tingling, intermittent dizziness- hx of orthostatic hypotension Psychiatric: no depression, no anxiety  Objective:     BP 129/79    Pulse 94    Ht 5' 4.5" (1.638 m)     Wt 198 lb 3.2 oz (89.9 kg)    SpO2 96%    BMI 33.50 kg/m   Wt Readings from Last 3 Encounters:  12/24/21 198 lb 3.2 oz (89.9 kg)  09/28/21 198 lb (89.8 kg)  11/20/20 206 lb (93.4 kg)     BP Readings from Last 3 Encounters:  12/24/21 129/79  09/28/21 134/72  12/25/20 137/85      Physical Exam- Limited  Constitutional:  Body mass index is 33.5 kg/m. , not in acute distress, normal state of mind Eyes:  EOMI, no exophthalmos Neck: Supple Cardiovascular: RRR, no murmurs, rubs, or gallops, no edema Respiratory: Adequate breathing efforts, no crackles, rales, rhonchi, or wheezing Musculoskeletal: no gross deformities, strength intact in all four extremities, no gross restriction of joint movements Skin:  no rashes, no hyperemia Neurological: no tremor with outstretched hands    CMP ( most recent) CMP     Component Value Date/Time   NA 140 05/18/2011 0856   K 4.0 05/18/2011 0856   CL 105 05/18/2011 0856   CO2 25 05/18/2011 0856   GLUCOSE 134 (H) 05/18/2011 0856   BUN 11 08/05/2021 0000   CREATININE 1.0 08/05/2021 0000   CREATININE 0.67 05/18/2011 0856   CALCIUM 8.1 (L) 05/18/2011 0856   PROT 6.8 07/18/2008 1120   ALBUMIN 4.1 07/18/2008 1120   AST 22 08/05/2021 0000   ALT 53 (A) 08/05/2021 0000   ALKPHOS 54 07/18/2008 1120   BILITOT 0.7 07/18/2008 1120   GFRNONAA 70 08/05/2021 0000   GFRAA >60 05/18/2011 0856     Diabetic Labs (most recent): Lab Results  Component Value Date   HGBA1C 12.8 (A) 09/28/2021   HGBA1C 9.6 (H) 10/26/2018     Lipid Panel ( most recent) Lipid Panel  No results found for: CHOL, TRIG, HDL, CHOLHDL, VLDL, LDLCALC, LDLDIRECT, LABVLDL    Lab Results  Component Value Date   TSH 2.639 10/18/2013           Assessment & Plan:   1) Uncontrolled Type 2 Diabetes with hyperglycemia  She presents today, accompanied  by her daughter, with her logs, no meter, showing persistently high glycemic profile overall.  She was not due for another  A1c today.  She reports she has gotten better with her eating pattern, but that she is struggling with eating healthy all the time.  She also admits to binge eating at night.  She denies any hypoglycemia.  - DANYELE SMEJKAL has currently uncontrolled symptomatic type 2 DM since 51 years of age, with most recent A1c of 12.8 %.   -Recent labs reviewed.  - I had a long discussion with her about the progressive nature of diabetes and the pathology behind its complications. -her diabetes is complicated by neuropathy and she remains at a high risk for more acute and chronic complications which include CAD, CVA, CKD, retinopathy, and neuropathy. These are all discussed in detail with her.  - Nutritional counseling repeated at each appointment due to patients tendency to fall back in to old habits.  - The patient admits there is a room for improvement in their diet and drink choices. -  Suggestion is made for the patient to avoid simple carbohydrates from their diet including Cakes, Sweet Desserts / Pastries, Ice Cream, Soda (diet and regular), Sweet Tea, Candies, Chips, Cookies, Sweet Pastries, Store Bought Juices, Alcohol in Excess of 1-2 drinks a day, Artificial Sweeteners, Coffee Creamer, and "Sugar-free" Products. This will help patient to have stable blood glucose profile and potentially avoid unintended weight gain.   - I encouraged the patient to switch to unprocessed or minimally processed complex starch and increased protein intake (animal or plant source), fruits, and vegetables.   - Patient is advised to stick to a routine mealtimes to eat 3 meals a day and avoid unnecessary snacks (to snack only to correct hypoglycemia).  - she will be scheduled with Jearld Fenton, RDN, CDE for diabetes education.  - I have approached her with the following individualized plan to manage her diabetes and patient agrees:   I discussed and initiated Toujeo 18 units SQ nightly.  She can continue her Glimepiride  4 mg po daily with breakfast.  Will give her a chance to avoid prandial insulin.  I also encouraged her to reach out to counselor regarding her binge eating habits, so she can develop other coping mechanisms for stress.  I am having her return in 2 weeks to make further adjustments to her medications.  We did go over proper insulin injection technique in the room today.  Sample of [place] provided to her.  -she is encouraged to continue monitoring blood glucose twice daily, before breakfast and before bed, and to call the clinic if she has readings less than 70 or above 300 for 3 tests in a row.  - she is warned not to take insulin without proper monitoring per orders. - Adjustment parameters are given to her for hypo and hyperglycemia in writing.  - she will be considered for incretin therapy as appropriate next visit.  - Specific targets for  A1c; LDL, HDL, and Triglycerides were discussed with the patient.  2) Blood Pressure /Hypertension:  her blood pressure is controlled to target.   she is advised to continue her current medications including Lisinopril 2.5 mg p.o. daily with breakfast.  3) Lipids/Hyperlipidemia:    There is no recent lipid panel available to review.  she is advised to continue Lipitor 10 mg daily at bedtime and Fenofibrate 200 mg po daily.  Side effects and precautions discussed with her.  Will repeat on subsequent  visits.  4)  Weight/Diet:  her Body mass index is 33.5 kg/m.  -  clearly complicating her diabetes care.   she is a candidate for weight loss. I discussed with her the fact that loss of 5 - 10% of her  current body weight will have the most impact on her diabetes management.  Exercise, and detailed carbohydrates information provided  -  detailed on discharge instructions.  5) Hypothyroidism- r/t total thyroidectomy for thyroid cancer She is currently taking Levothyroxine 200 mcg po daily before breakfast.  She takes it at night, along with her other  medications.  She is on rather large dose of thyroid medication than what her weight based maximum replacement should be (around 145 mcg po daily).  I discussed and lowered her dose of Levothyroxine to 150 mcg po daily before breakfast.  - The correct intake of thyroid hormone (Levothyroxine, Synthroid), is on empty stomach first thing in the morning, with water, separated by at least 30 minutes from breakfast and other medications,  and separated by more than 4 hours from calcium, iron, multivitamins, acid reflux medications (PPIs).  - This medication is a life-long medication and will be needed to correct thyroid hormone imbalances for the rest of your life.  The dose may change from time to time, based on thyroid blood work.  - It is extremely important to be consistent taking this medication, near the same time each morning.  -AVOID TAKING PRODUCTS CONTAINING BIOTIN (commonly found in Hair, Skin, Nails vitamins) AS IT INTERFERES WITH THE VALIDITY OF THYROID FUNCTION BLOOD TESTS.  6) Vitamin D deficiency Her most recent vitamin D level was 9 on 08/05/21.  She is currently on Ergocalciferol 50000 units weekly.  7) Chronic Care/Health Maintenance: -she is on ACEI/ARB and Statin medications and is encouraged to initiate and continue to follow up with Ophthalmology, Dentist, Podiatrist at least yearly or according to recommendations, and advised to stay away from smoking. I have recommended yearly flu vaccine and pneumonia vaccine at least every 5 years; moderate intensity exercise for up to 150 minutes weekly; and sleep for at least 7 hours a day.  - she is advised to maintain close follow up with Redmond School, MD for primary care needs, as well as her other providers for optimal and coordinated care.     I spent 40 minutes in the care of the patient today including review of labs from [place], Lipids, Thyroid Function, Hematology (current and previous including abstractions from other facilities);  face-to-face time discussing  her blood glucose readings/logs, discussing hypoglycemia and hyperglycemia episodes and symptoms, medications doses, her options of short and long term treatment based on the latest standards of care / guidelines;  discussion about incorporating lifestyle medicine;  and documenting the encounter.    Please refer to Patient Instructions for Blood Glucose Monitoring and Insulin/Medications Dosing Guide"  in media tab for additional information. Please  also refer to " Patient Self Inventory" in the Media  tab for reviewed elements of pertinent patient history.  Christy Sartorius participated in the discussions, expressed understanding, and voiced agreement with the above plans.  All questions were answered to her satisfaction. she is encouraged to contact clinic should she have any questions or concerns prior to her return visit.     Follow up plan: - Return in about 2 weeks (around 01/07/2022) for Diabetes F/U, Bring meter and logs.   Rayetta Pigg, [facility] [address] [place], [place] [address] Phone: [phone]  Fax: [phone]  12/24/2021, 10:28 AM

## 2021-12-27 ENCOUNTER — Telehealth: Payer: Self-pay | Admitting: Nurse Practitioner

## 2021-12-27 NOTE — Telephone Encounter (Signed)
Informed patient

## 2021-12-27 NOTE — Telephone Encounter (Signed)
Pt called about her readings since starting insulin. She states she is testing 2x a day, AM & PM.  1/21 366 - 419  1/22 345 - 512  1/23 341

## 2021-12-27 NOTE — Telephone Encounter (Signed)
Have her increase her Toujeo to 28 units nightly for now.

## 2021-12-30 NOTE — Telephone Encounter (Signed)
Pt increased her medication and called back this morning with more readings.  1/23 341 -- 347  1/24 395 -- 409  1/25 409 -- 396  1/26 408  CB# [phone]

## 2021-12-30 NOTE — Telephone Encounter (Signed)
Pt made aware

## 2021-12-30 NOTE — Telephone Encounter (Signed)
Let's increase further to 40 units nightly.  If this pattern continues, we will need to have her come in so we can talk about meal-time insulin also.

## 2022-01-07 ENCOUNTER — Ambulatory Visit (INDEPENDENT_AMBULATORY_CARE_PROVIDER_SITE_OTHER): Payer: BC Managed Care – PPO | Admitting: Nurse Practitioner

## 2022-01-07 ENCOUNTER — Other Ambulatory Visit: Payer: Self-pay

## 2022-01-07 ENCOUNTER — Encounter: Payer: Self-pay | Admitting: Nurse Practitioner

## 2022-01-07 VITALS — BP 126/79 | HR 63 | Ht 64.5 in | Wt 199.0 lb

## 2022-01-07 DIAGNOSIS — E039 Hypothyroidism, unspecified: Secondary | ICD-10-CM | POA: Diagnosis not present

## 2022-01-07 DIAGNOSIS — E1165 Type 2 diabetes mellitus with hyperglycemia: Secondary | ICD-10-CM | POA: Diagnosis not present

## 2022-01-07 LAB — POCT GLYCOSYLATED HEMOGLOBIN (HGB A1C): HbA1c POC (<> result, manual entry): 13.2 % (ref 4.0–5.6)

## 2022-01-07 MED ORDER — FREESTYLE LIBRE 3 SENSOR MISC: 1.0000 "application " | 3 refills | Status: DC

## 2022-01-07 MED ORDER — NOVOLOG FLEXPEN 100 UNIT/ML ~~LOC~~ SOPN: 10.0000 [IU] | PEN_INJECTOR | Freq: Three times a day (TID) | SUBCUTANEOUS | 3 refills | Status: DC

## 2022-01-07 NOTE — Patient Instructions (Signed)

## 2022-01-07 NOTE — Progress Notes (Signed)
Endocrinology Follow Up Note       01/07/2022, 11:04 AM   Subjective:    Patient ID: [name], female    DOB: [birth year].  [name] is being seen in follow up after being seen in consultation for management of currently uncontrolled symptomatic diabetes requested by  Redmond School, MD.   Past Medical History:  Diagnosis Date   BV (bacterial vaginosis)    BV   Cancer ([place]) 2009   papillary thyroid cancer   Diabetes mellitus without complication ([place])    Family history of breast cancer    Hyperlipidemia    Hypertension    IBS (irritable bowel syndrome)    Thyroid disease     Past Surgical History:  Procedure Laterality Date   ABLATION     CARPAL TUNNEL RELEASE     CESAREAN SECTION     NECK AND CHEST LESION     THYROIDECTOMY     Right and Left, 05/2008, 09/2008    Social History   Socioeconomic History   Marital status: Married    Spouse name: Not on file   Number of children: Not on file   Years of education: Not on file   Highest education level: Not on file  Occupational History   Not on file  Tobacco Use   Smoking status: Former   Smokeless tobacco: Never  Vaping Use   Vaping Use: Never used  Substance and Sexual Activity   Alcohol use: Yes    Comment: occ   Drug use: Never   Sexual activity: Yes    Comment: ablation  Other Topics Concern   Not on file  Social History Narrative   Not on file   Social Determinants of Health   Financial Resource Strain: Not on file  Food Insecurity: Not on file  Transportation Needs: Not on file  Physical Activity: Not on file  Stress: Not on file  Social Connections: Not on file    Family History  Problem Relation Age of Onset   Breast cancer Mother        died at age 18   Mental illness Mother    Hypertension Father    Hyperlipidemia Father    Diabetes Father    Breast cancer Maternal Grandmother        died in her late  18s   Lung cancer Maternal Uncle     Outpatient Encounter Medications as of 01/07/2022  Medication Sig   Continuous Blood Gluc Sensor (FREESTYLE LIBRE 3 SENSOR) MISC 1 application by Does not apply route every 14 (fourteen) days. Place 1 sensor on the skin every 14 days. Use to check glucose continuously   insulin aspart (NOVOLOG FLEXPEN) 100 UNIT/ML FlexPen Inject 10-16 Units into the skin 3 (three) times daily with meals.   ALPRAZolam (XANAX) 0.5 MG tablet Take 0.5 mg by mouth 2 (two) times daily as needed.   atorvastatin (LIPITOR) 10 MG tablet Take 10 mg by mouth daily.   blood glucose meter kit and supplies KIT Dispense based on patient and insurance preference. Use up to four times daily as directed.   butalbital-acetaminophen-caffeine (FIORICET) 50-325-40 MG tablet Take 1 tablet by mouth 2 (  two) times daily as needed.   diazepam (VALIUM) 2 MG tablet Take 2 mg by mouth 2 (two) times daily as needed.   Diethylpropion HCl 25 MG TABS Take 1 tablet by mouth 3 (three) times daily.   DULoxetine (CYMBALTA) 30 MG capsule Take 30 mg by mouth 2 (two) times daily.   fenofibrate micronized (LOFIBRA) 200 MG capsule Take 200 mg by mouth daily before breakfast.   fluconazole (DIFLUCAN) 100 MG tablet Take 100 mg by mouth daily.   gabapentin (NEURONTIN) 800 MG tablet Take 800 mg by mouth 4 (four) times daily.   Galcanezumab-gnlm (EMGALITY) 120 MG/ML SOAJ Inject 1 mL every month by subcutaneous route.   glimepiride (AMARYL) 4 MG tablet Take 1 tablet (4 mg total) by mouth daily.   HYDROcodone-acetaminophen (NORCO) 7.5-325 MG tablet Take 1 tablet by mouth every 8 (eight) hours as needed.   hydrOXYzine (ATARAX/VISTARIL) 25 MG tablet Take 25 mg by mouth every 4 (four) hours as needed.   insulin glargine, 2 Unit Dial, (TOUJEO MAX SOLOSTAR) 300 UNIT/ML Solostar Pen Inject 18 Units into the skin at bedtime. (Patient taking differently: Inject 40 Units into the skin at bedtime.)   Insulin Pen Needle (PEN NEEDLES)  31G X 8 MM MISC Use to inject insulin once daily   Lancets (ONETOUCH DELICA PLUS OVFIEP32R) MISC Apply 1 each topically 4 (four) times daily.   levothyroxine (SYNTHROID) 150 MCG tablet Take 1 tablet (150 mcg total) by mouth daily.   lisinopril (ZESTRIL) 2.5 MG tablet Take 2.5 mg by mouth daily.   meclizine (ANTIVERT) 25 MG tablet Take 25 mg by mouth 4 (four) times daily as needed.   Omega 3 1000 MG CAPS 1 capsule   ONETOUCH VERIO test strip 1 each 4 (four) times daily.   scopolamine (TRANSDERM-SCOP) 1 MG/3DAYS scopolamine 1 mg over 3 days transdermal patch  Apply 1 patch by transdermal route as directed.  Replace 1 every 3 days   traMADol (ULTRAM) 50 MG tablet Take 100 mg by mouth 3 (three) times daily as needed.   Vitamin D, Ergocalciferol, (DRISDOL) 1.25 MG (50000 UNIT) CAPS capsule Take 50,000 Units by mouth once a week.   No facility-administered encounter medications on file as of 01/07/2022.    ALLERGIES: Allergies  Allergen Reactions   Aspirin     Up set stomach Other reaction(s): stomach upset   Elemental Sulfur Hives   Sulfur Dioxide Hives   Sulfamethoxazole-Trimethoprim Rash    VACCINATION STATUS:  There is no immunization history on file for this patient.  Diabetes She presents for her follow-up diabetic visit. She has type 2 diabetes mellitus. Onset time: had never been formally diagnosed- has gest DM with both children- started on meds about 1 year ago at age of 58. Her disease course has been stable. There are no hypoglycemic associated symptoms. Associated symptoms include blurred vision, fatigue, foot paresthesias, polydipsia and polyuria. There are no hypoglycemic complications. Symptoms are stable. Diabetic complications include nephropathy and peripheral neuropathy. Risk factors for coronary artery disease include diabetes mellitus, dyslipidemia, family history, obesity, hypertension and sedentary lifestyle. Current diabetic treatment includes oral agent (monotherapy)  and insulin injections. She is compliant with treatment most of the time. Her weight is stable. She is following a generally unhealthy diet. When asked about meal planning, she reported none. She has not had a previous visit with a dietitian. She rarely participates in exercise. Her home blood glucose trend is fluctuating minimally. Her breakfast blood glucose range is generally >200 mg/dl. Her bedtime  blood glucose range is generally >200 mg/dl. Her overall blood glucose range is >200 mg/dl. (She presents today, accompanied by her husband, with her logs, no meter, showing unchanged glycemic profile despite initiating higher dose of basal insulin.  Her POCT A1c today is 13.2%, increasing from previous visit of 12.8%.  She still consumes diet soda and has cut back on her late night snacking.  She overall feels more fatigued in the last few weeks.) An ACE inhibitor/angiotensin II receptor blocker is not being taken. She does not see a podiatrist.Eye exam is current.    Review of systems  Constitutional: + Minimally fluctuating body weight,  current Body mass index is 33.63 kg/m. , + fatigue, no subjective hyperthermia, no subjective hypothermia Eyes: no blurry vision, no xerophthalmia ENT: no sore throat, no nodules palpated in throat, no dysphagia/odynophagia, no hoarseness Cardiovascular: no chest pain, no shortness of breath, no palpitations, no leg swelling Respiratory: no cough, no shortness of breath Gastrointestinal: no nausea/vomiting/diarrhea Musculoskeletal: no muscle/joint aches Skin: no rashes, no hyperemia Neurological: no tremors, no numbness, no tingling, intermittent dizziness- hx of orthostatic hypotension Psychiatric: no depression, no anxiety  Objective:     BP 126/79    Pulse 63    Ht 5' 4.5" (1.638 m)    Wt 199 lb (90.3 kg)    BMI 33.63 kg/m   Wt Readings from Last 3 Encounters:  01/07/22 199 lb (90.3 kg)  12/24/21 198 lb 3.2 oz (89.9 kg)  09/28/21 198 lb (89.8 kg)      BP Readings from Last 3 Encounters:  01/07/22 126/79  12/24/21 129/79  09/28/21 134/72     Physical Exam- Limited  Constitutional:  Body mass index is 33.63 kg/m. , not in acute distress, normal state of mind Eyes:  EOMI, no exophthalmos Neck: Supple Cardiovascular: RRR, no murmurs, rubs, or gallops, no edema Respiratory: Adequate breathing efforts, no crackles, rales, rhonchi, or wheezing Musculoskeletal: no gross deformities, strength intact in all four extremities, no gross restriction of joint movements Skin:  no rashes, no hyperemia Neurological: no tremor with outstretched hands    CMP ( most recent) CMP     Component Value Date/Time   NA 140 05/18/2011 0856   K 4.0 05/18/2011 0856   CL 105 05/18/2011 0856   CO2 25 05/18/2011 0856   GLUCOSE 134 (H) 05/18/2011 0856   BUN 11 08/05/2021 0000   CREATININE 1.0 08/05/2021 0000   CREATININE 0.67 05/18/2011 0856   CALCIUM 8.1 (L) 05/18/2011 0856   PROT 6.8 07/18/2008 1120   ALBUMIN 4.1 07/18/2008 1120   AST 22 08/05/2021 0000   ALT 53 (A) 08/05/2021 0000   ALKPHOS 54 07/18/2008 1120   BILITOT 0.7 07/18/2008 1120   GFRNONAA 70 08/05/2021 0000   GFRAA >60 05/18/2011 0856     Diabetic Labs (most recent): Lab Results  Component Value Date   HGBA1C 13.2 01/07/2022   HGBA1C 12.8 (A) 09/28/2021   HGBA1C 9.6 (H) 10/26/2018     Lipid Panel ( most recent) Lipid Panel  No results found for: CHOL, TRIG, HDL, CHOLHDL, VLDL, LDLCALC, LDLDIRECT, LABVLDL    Lab Results  Component Value Date   TSH 2.639 10/18/2013           Assessment & Plan:   1) Uncontrolled Type 2 Diabetes with hyperglycemia  She presents today, accompanied by her husband, with her logs, no meter, showing unchanged glycemic profile despite initiating higher dose of basal insulin.  Her POCT A1c today is 13.2%, increasing  from previous visit of 12.8%.  She still consumes diet soda and has cut back on her late night snacking.  She overall feels  more fatigued in the last few weeks.  - [name] has currently uncontrolled symptomatic type 2 DM since 51 years of age.   -Recent labs reviewed.  - I had a long discussion with her about the progressive nature of diabetes and the pathology behind its complications. -her diabetes is complicated by neuropathy and she remains at a high risk for more acute and chronic complications which include CAD, CVA, CKD, retinopathy, and neuropathy. These are all discussed in detail with her.  - Nutritional counseling repeated at each appointment due to patients tendency to fall back in to old habits.  - The patient admits there is a room for improvement in their diet and drink choices. -  Suggestion is made for the patient to avoid simple carbohydrates from their diet including Cakes, Sweet Desserts / Pastries, Ice Cream, Soda (diet and regular), Sweet Tea, Candies, Chips, Cookies, Sweet Pastries, Store Bought Juices, Alcohol in Excess of 1-2 drinks a day, Artificial Sweeteners, Coffee Creamer, and "Sugar-free" Products. This will help patient to have stable blood glucose profile and potentially avoid unintended weight gain.   - I encouraged the patient to switch to unprocessed or minimally processed complex starch and increased protein intake (animal or plant source), fruits, and vegetables.   - Patient is advised to stick to a routine mealtimes to eat 3 meals a day and avoid unnecessary snacks (to snack only to correct hypoglycemia).  - she will be scheduled with Jearld Fenton, RDN, CDE for diabetes education.  - I have approached her with the following individualized plan to manage her diabetes and patient agrees:   Based on her continued hyperglycemia, I discussed and added bolus insulin to her regimen.  She is advised to continue Toujeo 40 units SQ nightly and start Novolog 10-16 units TID with meals if glucose is above 90 and she is eating (Specific instructions on how to titrate insulin dosage  based on glucose readings given to patient in writing).  She demonstrated her ability to correctly use the SSI chart with me today.  She can continue Glimepiride 4 mg po daily with breakfast for now.   -she is encouraged to continue monitoring blood glucose 4 times daily, before meals and before bed, and to call the clinic if she has readings less than 70 or above 300 for 3 tests in a row.  She could benefit from the addition of a CGM.  Sample [place] 3 provided from office today, which I assisted her in applying.  Rx sent to her local pharmacy for fulfillment.  - she is warned not to take insulin without proper monitoring per orders. - Adjustment parameters are given to her for hypo and hyperglycemia in writing.  - she will be considered for incretin therapy as appropriate next visit.  - Specific targets for  A1c; LDL, HDL, and Triglycerides were discussed with the patient.  2) Blood Pressure /Hypertension:  her blood pressure is controlled to target.   she is advised to continue her current medications including Lisinopril 2.5 mg p.o. daily with breakfast.  3) Lipids/Hyperlipidemia:    There is no recent lipid panel available to review.  she is advised to continue Lipitor 10 mg daily at bedtime and Fenofibrate 200 mg po daily.  Side effects and precautions discussed with her.  Will repeat on subsequent visits.  4)  Weight/Diet:  her Body mass index is 33.63 kg/m.  -  clearly complicating her diabetes care.   she is a candidate for weight loss. I discussed with her the fact that loss of 5 - 10% of her  current body weight will have the most impact on her diabetes management.  Exercise, and detailed carbohydrates information provided  -  detailed on discharge instructions.  5) Hypothyroidism- r/t total thyroidectomy for thyroid cancer -She is currently on Levothyroxine 150 mcg po daily before breakfast.  She is more fatigued recently, will recheck TFTs prior to next visit and adjust dose if  needed.     - The correct intake of thyroid hormone (Levothyroxine, Synthroid), is on empty stomach first thing in the morning, with water, separated by at least 30 minutes from breakfast and other medications,  and separated by more than 4 hours from calcium, iron, multivitamins, acid reflux medications (PPIs).  - This medication is a life-long medication and will be needed to correct thyroid hormone imbalances for the rest of your life.  The dose may change from time to time, based on thyroid blood work.  - It is extremely important to be consistent taking this medication, near the same time each morning.  -AVOID TAKING PRODUCTS CONTAINING BIOTIN (commonly found in Hair, Skin, Nails vitamins) AS IT INTERFERES WITH THE VALIDITY OF THYROID FUNCTION BLOOD TESTS.  6) Vitamin D deficiency Her most recent vitamin D level was 9 on 08/05/21.  She is currently on Ergocalciferol 50000 units weekly.  7) Chronic Care/Health Maintenance: -she is on ACEI/ARB and Statin medications and is encouraged to initiate and continue to follow up with Ophthalmology, Dentist, Podiatrist at least yearly or according to recommendations, and advised to stay away from smoking. I have recommended yearly flu vaccine and pneumonia vaccine at least every 5 years; moderate intensity exercise for up to 150 minutes weekly; and sleep for at least 7 hours a day.  - she is advised to maintain close follow up with Redmond School, MD for primary care needs, as well as her other providers for optimal and coordinated care.      I spent 41 minutes in the care of the patient today including review of labs from [facility], Lipids, Thyroid Function, Hematology (current and previous including abstractions from other facilities); face-to-face time discussing  her blood glucose readings/logs, discussing hypoglycemia and hyperglycemia episodes and symptoms, medications doses, her options of short and long term treatment based on the latest standards  of care / guidelines;  discussion about incorporating lifestyle medicine;  and documenting the encounter.    Please refer to Patient Instructions for Blood Glucose Monitoring and Insulin/Medications Dosing Guide"  in media tab for additional information. Please  also refer to " Patient Self Inventory" in the Media  tab for reviewed elements of pertinent patient history.  Christy Sartorius participated in the discussions, expressed understanding, and voiced agreement with the above plans.  All questions were answered to her satisfaction. she is encouraged to contact clinic should she have any questions or concerns prior to her return visit.     Follow up plan: - Return in about 2 weeks (around 01/21/2022) for Diabetes F/U, Bring meter and logs, Previsit labs.   Rayetta Pigg, [facility] [address] [place], [place] [address] Phone: [phone] Fax: [phone]  01/07/2022, 11:04 AM

## 2022-01-10 ENCOUNTER — Telehealth: Payer: Self-pay

## 2022-01-10 NOTE — Telephone Encounter (Signed)
Started prior authorization for [employer] 3 Sensors Key: [employer]

## 2022-01-11 ENCOUNTER — Other Ambulatory Visit: Payer: Self-pay

## 2022-01-11 LAB — COMPREHENSIVE METABOLIC PANEL
ALT: 69 IU/L — ABNORMAL HIGH (ref 0–32)
AST: 31 IU/L (ref 0–40)
Albumin/Globulin Ratio: 1.5 (ref 1.2–2.2)
Albumin: 4.8 g/dL (ref 3.8–4.9)
Alkaline Phosphatase: 66 IU/L (ref 44–121)
BUN/Creatinine Ratio: 20 (ref 9–23)
BUN: 13 mg/dL (ref 6–24)
Bilirubin Total: 0.3 mg/dL (ref 0.0–1.2)
CO2: 21 mmol/L (ref 20–29)
Calcium: 10 mg/dL (ref 8.7–10.2)
Chloride: 90 mmol/L — ABNORMAL LOW (ref 96–106)
Creatinine, Ser: 0.66 mg/dL (ref 0.57–1.00)
Globulin, Total: 3.2 g/dL (ref 1.5–4.5)
Glucose: 373 mg/dL — ABNORMAL HIGH (ref 70–99)
Potassium: 4.5 mmol/L (ref 3.5–5.2)
Sodium: 130 mmol/L — ABNORMAL LOW (ref 134–144)
Total Protein: 8 g/dL (ref 6.0–8.5)
eGFR: 106 mL/min/{1.73_m2} (ref >59)

## 2022-01-11 LAB — TSH: TSH: 3.63 u[IU]/mL (ref 0.450–4.500)

## 2022-01-11 LAB — T4, FREE: Free T4: 1.2 ng/dL (ref 0.82–1.77)

## 2022-01-11 MED ORDER — FREESTYLE LIBRE 14 DAY READER DEVI: 0 refills | Status: DC

## 2022-01-11 MED ORDER — FREESTYLE LIBRE 14 DAY SENSOR MISC: 3 refills | Status: DC

## 2022-01-11 NOTE — Telephone Encounter (Signed)
Yes, please.  Any CGM will work.  I hate that.  Did they say why they denied it?

## 2022-01-11 NOTE — Telephone Encounter (Signed)
Received a fax from [place] that they have denied the [facility] 3 for the patient. Do you want to try something else?

## 2022-01-11 NOTE — Telephone Encounter (Signed)
I do not see on the paperwork why they denied this one. I have sent the Freestyle Libre 14 day one in to her pharmacy. Will start with this one first and if denied then will send Dexcom.

## 2022-01-18 ENCOUNTER — Telehealth: Payer: Self-pay | Admitting: Nurse Practitioner

## 2022-01-18 MED ORDER — DEXCOM G6 TRANSMITTER MISC: 3 refills | Status: DC

## 2022-01-18 MED ORDER — DEXCOM G6 SENSOR MISC: 3 refills | Status: DC

## 2022-01-18 NOTE — Addendum Note (Signed)
Addended by: Brita Romp on: 01/18/2022 03:44 PM   Modules accepted: Orders

## 2022-01-18 NOTE — Telephone Encounter (Signed)
Well darn, I guess I will send in dexcom for her then.  I sent it to [facility]

## 2022-01-18 NOTE — Telephone Encounter (Signed)
Pt will get it and bring with her to her appt Friday

## 2022-01-18 NOTE — Telephone Encounter (Signed)
Pt is calling and states that [facility] is telling her that they are denying the [place] and her insurance would cover the dexcom. She uses [employer]

## 2022-01-18 NOTE — Telephone Encounter (Signed)
Informed patient

## 2022-01-18 NOTE — Telephone Encounter (Signed)
I spoke with [name] about her today, she said if she calls the number that was on the [place] 3 bag, it will bypass the denial and she'll be able to get it.  The number is [phone].

## 2022-01-18 NOTE — Telephone Encounter (Signed)
Pt states she called that number spoke with Richardson Landry for 25 minutes and he states their is no bypassing of the denial with insurance, that they can only send her one replacement

## 2022-01-21 ENCOUNTER — Other Ambulatory Visit: Payer: Self-pay

## 2022-01-21 ENCOUNTER — Encounter: Payer: Self-pay | Admitting: Nurse Practitioner

## 2022-01-21 ENCOUNTER — Ambulatory Visit (INDEPENDENT_AMBULATORY_CARE_PROVIDER_SITE_OTHER): Payer: BC Managed Care – PPO | Admitting: Nurse Practitioner

## 2022-01-21 VITALS — BP 106/67 | HR 84 | Ht 64.5 in | Wt 201.6 lb

## 2022-01-21 DIAGNOSIS — E039 Hypothyroidism, unspecified: Secondary | ICD-10-CM

## 2022-01-21 DIAGNOSIS — E1165 Type 2 diabetes mellitus with hyperglycemia: Secondary | ICD-10-CM

## 2022-01-21 MED ORDER — TOUJEO MAX SOLOSTAR 300 UNIT/ML ~~LOC~~ SOPN: 50.0000 [IU] | PEN_INJECTOR | Freq: Every evening | SUBCUTANEOUS | 3 refills | Status: DC

## 2022-01-21 MED ORDER — NOVOLOG FLEXPEN 100 UNIT/ML ~~LOC~~ SOPN: 12.0000 [IU] | PEN_INJECTOR | Freq: Three times a day (TID) | SUBCUTANEOUS | 3 refills | Status: DC

## 2022-01-21 NOTE — Patient Instructions (Signed)

## 2022-01-21 NOTE — Progress Notes (Signed)
Endocrinology Follow Up Note       01/21/2022, 11:00 AM   Subjective:    Patient ID: [name], female    DOB: 04/18/1971.  [name] is being seen in follow up after being seen in consultation for management of currently uncontrolled symptomatic diabetes requested by  Redmond School, MD.   Past Medical History:  Diagnosis Date   BV (bacterial vaginosis)    BV   Cancer ([place]) 2009   papillary thyroid cancer   Diabetes mellitus without complication ([place])    Family history of breast cancer    Hyperlipidemia    Hypertension    IBS (irritable bowel syndrome)    Thyroid disease     Past Surgical History:  Procedure Laterality Date   ABLATION     CARPAL TUNNEL RELEASE     CESAREAN SECTION     NECK AND CHEST LESION     THYROIDECTOMY     Right and Left, 05/2008, 09/2008    Social History   Socioeconomic History   Marital status: Married    Spouse name: Not on file   Number of children: Not on file   Years of education: Not on file   Highest education level: Not on file  Occupational History   Not on file  Tobacco Use   Smoking status: Former   Smokeless tobacco: Never  Vaping Use   Vaping Use: Never used  Substance and Sexual Activity   Alcohol use: Yes    Comment: occ   Drug use: Never   Sexual activity: Yes    Comment: ablation  Other Topics Concern   Not on file  Social History Narrative   Not on file   Social Determinants of Health   Financial Resource Strain: Not on file  Food Insecurity: Not on file  Transportation Needs: Not on file  Physical Activity: Not on file  Stress: Not on file  Social Connections: Not on file    Family History  Problem Relation Age of Onset   Breast cancer Mother        died at age 85   Mental illness Mother    Hypertension Father    Hyperlipidemia Father    Diabetes Father    Breast cancer Maternal Grandmother        died in her late  43s   Lung cancer Maternal Uncle     Outpatient Encounter Medications as of 01/21/2022  Medication Sig   ALPRAZolam (XANAX) 0.5 MG tablet Take 0.5 mg by mouth 2 (two) times daily as needed.   atorvastatin (LIPITOR) 10 MG tablet Take 10 mg by mouth daily.   B-D UF III MINI PEN NEEDLES 31G X 5 MM MISC Inject 1 each into the skin 3 (three) times daily.   blood glucose meter kit and supplies KIT Dispense based on patient and insurance preference. Use up to four times daily as directed.   butalbital-acetaminophen-caffeine (FIORICET) 50-325-40 MG tablet Take 1 tablet by mouth 2 (two) times daily as needed.   Continuous Blood Gluc Receiver (FREESTYLE LIBRE 14 DAY READER) DEVI Use to check blood glucose 4 times daily.   Continuous Blood Gluc Sensor (DEXCOM G6 SENSOR)  MISC Change sensor every 10 days as directed   Continuous Blood Gluc Transmit (DEXCOM G6 TRANSMITTER) MISC Change transmitter every 90 days as directed.   diazepam (VALIUM) 2 MG tablet Take 2 mg by mouth 2 (two) times daily as needed.   Diethylpropion HCl 25 MG TABS Take 1 tablet by mouth 3 (three) times daily.   DULoxetine (CYMBALTA) 30 MG capsule Take 30 mg by mouth 2 (two) times daily.   fenofibrate micronized (LOFIBRA) 200 MG capsule Take 200 mg by mouth daily before breakfast.   fluconazole (DIFLUCAN) 100 MG tablet Take 100 mg by mouth daily.   gabapentin (NEURONTIN) 800 MG tablet Take 800 mg by mouth 4 (four) times daily.   Galcanezumab-gnlm (EMGALITY) 120 MG/ML SOAJ Inject 1 mL every month by subcutaneous route.   glimepiride (AMARYL) 4 MG tablet Take 1 tablet (4 mg total) by mouth daily.   HYDROcodone-acetaminophen (NORCO) 7.5-325 MG tablet Take 1 tablet by mouth every 8 (eight) hours as needed.   hydrOXYzine (ATARAX/VISTARIL) 25 MG tablet Take 25 mg by mouth every 4 (four) hours as needed.   Insulin Pen Needle (PEN NEEDLES) 31G X 8 MM MISC Use to inject insulin once daily   Lancets (ONETOUCH DELICA PLUS GOTLXB26O) MISC Apply 1  each topically 4 (four) times daily.   levothyroxine (SYNTHROID) 150 MCG tablet Take 1 tablet (150 mcg total) by mouth daily.   lisinopril (ZESTRIL) 2.5 MG tablet Take 2.5 mg by mouth daily.   meclizine (ANTIVERT) 25 MG tablet Take 25 mg by mouth 4 (four) times daily as needed.   Omega 3 1000 MG CAPS 1 capsule   ONETOUCH VERIO test strip 1 each 4 (four) times daily.   scopolamine (TRANSDERM-SCOP) 1 MG/3DAYS scopolamine 1 mg over 3 days transdermal patch  Apply 1 patch by transdermal route as directed.  Replace 1 every 3 days   traMADol (ULTRAM) 50 MG tablet Take 100 mg by mouth 3 (three) times daily as needed.   Vitamin D, Ergocalciferol, (DRISDOL) 1.25 MG (50000 UNIT) CAPS capsule Take 50,000 Units by mouth once a week.   [DISCONTINUED] insulin aspart (NOVOLOG FLEXPEN) 100 UNIT/ML FlexPen Inject 10-16 Units into the skin 3 (three) times daily with meals.   [DISCONTINUED] insulin glargine, 2 Unit Dial, (TOUJEO MAX SOLOSTAR) 300 UNIT/ML Solostar Pen Inject 18 Units into the skin at bedtime. (Patient taking differently: Inject 40 Units into the skin at bedtime.)   insulin aspart (NOVOLOG FLEXPEN) 100 UNIT/ML FlexPen Inject 12-18 Units into the skin 3 (three) times daily with meals.   insulin glargine, 2 Unit Dial, (TOUJEO MAX SOLOSTAR) 300 UNIT/ML Solostar Pen Inject 50 Units into the skin at bedtime.   No facility-administered encounter medications on file as of 01/21/2022.    ALLERGIES: Allergies  Allergen Reactions   Aspirin     Up set stomach Other reaction(s): stomach upset   Elemental Sulfur Hives   Sulfur Dioxide Hives   Sulfamethoxazole-Trimethoprim Rash    VACCINATION STATUS:  There is no immunization history on file for this patient.  Diabetes She presents for her follow-up diabetic visit. She has type 2 diabetes mellitus. Onset time: had never been formally diagnosed- has gest DM with both children- started on meds about 1 year ago at age of 51. Her disease course has been  improving. There are no hypoglycemic associated symptoms. Associated symptoms include blurred vision, fatigue, foot paresthesias, polydipsia and polyuria. There are no hypoglycemic complications. Symptoms are stable. Diabetic complications include nephropathy and peripheral neuropathy. Risk factors for  coronary artery disease include diabetes mellitus, dyslipidemia, family history, obesity, hypertension and sedentary lifestyle. Current diabetic treatment includes oral agent (monotherapy) and intensive insulin program. She is compliant with treatment most of the time. Her weight is stable. She is following a generally unhealthy diet. When asked about meal planning, she reported none. She has not had a previous visit with a dietitian. She rarely participates in exercise. Her home blood glucose trend is fluctuating minimally. Her breakfast blood glucose range is generally >200 mg/dl. Her lunch blood glucose range is generally >200 mg/dl. Her dinner blood glucose range is generally >200 mg/dl. Her bedtime blood glucose range is generally >200 mg/dl. Her overall blood glucose range is >200 mg/dl. (She presents today with her CGM, no logs, showing slowly improving glycemic profile with gross hyperglycemia.  She was not due for another A1c today.  Analysis of her CGM shows TIR 7%, TAR 93%, TBR 0% with a GMI of 10.2%.  Insurance would not cover her Elenor Legato 3 so we switched to Dexcom.  She reports she has cut out sodas but has cheated on her diet at times.) An ACE inhibitor/angiotensin II receptor blocker is not being taken. She does not see a podiatrist.Eye exam is current.    Review of systems  Constitutional: + Minimally fluctuating body weight,  current Body mass index is 34.07 kg/m. , + fatigue, no subjective hyperthermia, no subjective hypothermia Eyes: no blurry vision, no xerophthalmia ENT: no sore throat, no nodules palpated in throat, no dysphagia/odynophagia, no hoarseness Cardiovascular: no chest pain, no  shortness of breath, no palpitations, no leg swelling Respiratory: no cough, no shortness of breath Gastrointestinal: no nausea/vomiting/diarrhea Musculoskeletal: no muscle/joint aches Skin: no rashes, no hyperemia Neurological: no tremors, no numbness, no tingling, intermittent dizziness- hx of orthostatic hypotension Psychiatric: no depression, no anxiety  Objective:     BP 106/67    Pulse 84    Ht 5' 4.5" (1.638 m)    Wt 201 lb 9.6 oz (91.4 kg)    SpO2 98%    BMI 34.07 kg/m   Wt Readings from Last 3 Encounters:  01/21/22 201 lb 9.6 oz (91.4 kg)  01/07/22 199 lb (90.3 kg)  12/24/21 198 lb 3.2 oz (89.9 kg)     BP Readings from Last 3 Encounters:  01/21/22 106/67  01/07/22 126/79  12/24/21 129/79      Physical Exam- Limited  Constitutional:  Body mass index is 34.07 kg/m. , not in acute distress, normal state of mind Eyes:  EOMI, no exophthalmos Neck: Supple Cardiovascular: RRR, no murmurs, rubs, or gallops, no edema Respiratory: Adequate breathing efforts, no crackles, rales, rhonchi, or wheezing Musculoskeletal: no gross deformities, strength intact in all four extremities, no gross restriction of joint movements Skin:  no rashes, no hyperemia Neurological: no tremor with outstretched hands    CMP ( most recent) CMP     Component Value Date/Time   NA 130 (L) 01/07/2022 1017   K 4.5 01/07/2022 1017   CL 90 (L) 01/07/2022 1017   CO2 21 01/07/2022 1017   GLUCOSE 373 (H) 01/07/2022 1017   GLUCOSE 134 (H) 05/18/2011 0856   BUN 13 01/07/2022 1017   CREATININE 0.66 01/07/2022 1017   CALCIUM 10.0 01/07/2022 1017   PROT 8.0 01/07/2022 1017   ALBUMIN 4.8 01/07/2022 1017   AST 31 01/07/2022 1017   ALT 69 (H) 01/07/2022 1017   ALKPHOS 66 01/07/2022 1017   BILITOT 0.3 01/07/2022 1017   GFRNONAA 70 08/05/2021 0000   GFRAA >  60 05/18/2011 0856     Diabetic Labs (most recent): Lab Results  Component Value Date   HGBA1C 13.2 01/07/2022   HGBA1C 12.8 (A) 09/28/2021    HGBA1C 9.6 (H) 10/26/2018     Lipid Panel ( most recent) Lipid Panel  No results found for: CHOL, TRIG, HDL, CHOLHDL, VLDL, LDLCALC, LDLDIRECT, LABVLDL    Lab Results  Component Value Date   TSH 3.630 01/07/2022   TSH 2.639 10/18/2013   FREET4 1.20 01/07/2022           Assessment & Plan:   1) Uncontrolled Type 2 Diabetes with hyperglycemia  She presents today with her CGM, no logs, showing slowly improving glycemic profile with gross hyperglycemia.  She was not due for another A1c today.  Analysis of her CGM shows TIR 7%, TAR 93%, TBR 0% with a GMI of 10.2%.  Insurance would not cover her Elenor Legato 3 so we switched to Dexcom.  She reports she has cut out sodas but has cheated on her diet at times.  - [name] has currently uncontrolled symptomatic type 2 DM since 51 years of age.   -Recent labs reviewed.  - I had a long discussion with her about the progressive nature of diabetes and the pathology behind its complications. -her diabetes is complicated by neuropathy and she remains at a high risk for more acute and chronic complications which include CAD, CVA, CKD, retinopathy, and neuropathy. These are all discussed in detail with her.  - Nutritional counseling repeated at each appointment due to patients tendency to fall back in to old habits.  - The patient admits there is a room for improvement in their diet and drink choices. -  Suggestion is made for the patient to avoid simple carbohydrates from their diet including Cakes, Sweet Desserts / Pastries, Ice Cream, Soda (diet and regular), Sweet Tea, Candies, Chips, Cookies, Sweet Pastries, Store Bought Juices, Alcohol in Excess of 1-2 drinks a day, Artificial Sweeteners, Coffee Creamer, and "Sugar-free" Products. This will help patient to have stable blood glucose profile and potentially avoid unintended weight gain.   - I encouraged the patient to switch to unprocessed or minimally processed complex starch and increased  protein intake (animal or plant source), fruits, and vegetables.   - Patient is advised to stick to a routine mealtimes to eat 3 meals a day and avoid unnecessary snacks (to snack only to correct hypoglycemia).  - she will be scheduled with Jearld Fenton, RDN, CDE for diabetes education.  - I have approached her with the following individualized plan to manage her diabetes and patient agrees:   Given her continued hyperglycemia, will increase her Toujeo to 50 units SQ nightly and increase her Novolog to 12-18 units TID with meals if glucose is above 90 and she is eating (Specific instructions on how to titrate insulin dosage based on glucose readings given to patient in writing).  She can also continue her Glimepiride 4 mg po daily with breakfast.  -she is encouraged to continue monitoring blood glucose 4 times daily (using her CGM), before meals and before bed, and to call the clinic if she has readings less than 70 or above 300 for 3 tests in a row.  I assisted patient set up her Dexcom sensor and account today.  - she is warned not to take insulin without proper monitoring per orders. - Adjustment parameters are given to her for hypo and hyperglycemia in writing.  - she will be considered for incretin therapy  as appropriate next visit.  - Specific targets for  A1c; LDL, HDL, and Triglycerides were discussed with the patient.  2) Blood Pressure /Hypertension:  her blood pressure is controlled to target.   she is advised to continue her current medications including Lisinopril 2.5 mg p.o. daily with breakfast.  3) Lipids/Hyperlipidemia:    There is no recent lipid panel available to review.  she is advised to continue Lipitor 10 mg daily at bedtime and Fenofibrate 200 mg po daily.  Side effects and precautions discussed with her.  Will repeat on subsequent visits.  4)  Weight/Diet:  her Body mass index is 34.07 kg/m.  -  clearly complicating her diabetes care.   she is a candidate for  weight loss. I discussed with her the fact that loss of 5 - 10% of her  current body weight will have the most impact on her diabetes management.  Exercise, and detailed carbohydrates information provided  -  detailed on discharge instructions.  5) Hypothyroidism- r/t total thyroidectomy for thyroid cancer -She is currently on Levothyroxine 150 mcg po daily before breakfast.  Her previsit thyroid function tests are consistent with appropriate hormone replacement.  She is advised to continue her current dose of Levothyroxine at this time.   - The correct intake of thyroid hormone (Levothyroxine, Synthroid), is on empty stomach first thing in the morning, with water, separated by at least 30 minutes from breakfast and other medications,  and separated by more than 4 hours from calcium, iron, multivitamins, acid reflux medications (PPIs).  - This medication is a life-long medication and will be needed to correct thyroid hormone imbalances for the rest of your life.  The dose may change from time to time, based on thyroid blood work.  - It is extremely important to be consistent taking this medication, near the same time each morning.  -AVOID TAKING PRODUCTS CONTAINING BIOTIN (commonly found in Hair, Skin, Nails vitamins) AS IT INTERFERES WITH THE VALIDITY OF THYROID FUNCTION BLOOD TESTS.  6) Vitamin D deficiency Her most recent vitamin D level was 9 on 08/05/21.  She is currently on Ergocalciferol 50000 units weekly.  7) Chronic Care/Health Maintenance: -she is on ACEI/ARB and Statin medications and is encouraged to initiate and continue to follow up with Ophthalmology, Dentist, Podiatrist at least yearly or according to recommendations, and advised to stay away from smoking. I have recommended yearly flu vaccine and pneumonia vaccine at least every 5 years; moderate intensity exercise for up to 150 minutes weekly; and sleep for at least 7 hours a day.  - she is advised to maintain close follow up with  Redmond School, MD for primary care needs, as well as her other providers for optimal and coordinated care.      I spent 40 minutes in the care of the patient today including review of labs from [facility], Lipids, Thyroid Function, Hematology (current and previous including abstractions from other facilities); face-to-face time discussing  her blood glucose readings/logs, discussing hypoglycemia and hyperglycemia episodes and symptoms, medications doses, her options of short and long term treatment based on the latest standards of care / guidelines;  discussion about incorporating lifestyle medicine;  and documenting the encounter.    Please refer to Patient Instructions for Blood Glucose Monitoring and Insulin/Medications Dosing Guide"  in media tab for additional information. Please  also refer to " Patient Self Inventory" in the Media  tab for reviewed elements of pertinent patient history.  [name] participated in the discussions,  expressed understanding, and voiced agreement with the above plans.  All questions were answered to her satisfaction. she is encouraged to contact clinic should she have any questions or concerns prior to her return visit.     Follow up plan: - Return in about 1 month (around 02/18/2022) for Diabetes F/U, Bring meter and logs.   Rayetta Pigg, [facility] [address] [place], [place] [address] Phone: [phone] Fax: [phone]  01/21/2022, 11:00 AM

## 2022-02-07 DIAGNOSIS — G609 Hereditary and idiopathic neuropathy, unspecified: Secondary | ICD-10-CM | POA: Diagnosis not present

## 2022-02-07 DIAGNOSIS — Z79899 Other long term (current) drug therapy: Secondary | ICD-10-CM | POA: Diagnosis not present

## 2022-02-07 DIAGNOSIS — Z79891 Long term (current) use of opiate analgesic: Secondary | ICD-10-CM | POA: Diagnosis not present

## 2022-02-15 ENCOUNTER — Telehealth: Payer: Self-pay

## 2022-02-15 NOTE — Telephone Encounter (Signed)
Called patient and left a detailed voice message for her to let us know if she wants to use [facility] mail service pharmacy for her Levothyroxine 150 mcg instead of [facility] for this medication. ?

## 2022-02-16 MED ORDER — LEVOTHYROXINE SODIUM 150 MCG PO TABS: 150.0000 ug | ORAL_TABLET | Freq: Every day | ORAL | 1 refills | Status: DC

## 2022-02-16 NOTE — Telephone Encounter (Signed)
Pt called and requested that the Levothyroxine be sent to [facility]. Rx sent ?

## 2022-02-18 ENCOUNTER — Other Ambulatory Visit: Payer: Self-pay

## 2022-02-18 ENCOUNTER — Ambulatory Visit (INDEPENDENT_AMBULATORY_CARE_PROVIDER_SITE_OTHER): Payer: BC Managed Care – PPO | Admitting: Nurse Practitioner

## 2022-02-18 ENCOUNTER — Encounter: Payer: Self-pay | Admitting: Nurse Practitioner

## 2022-02-18 VITALS — BP 109/64 | HR 67 | Ht 64.5 in | Wt 209.6 lb

## 2022-02-18 DIAGNOSIS — E890 Postprocedural hypothyroidism: Secondary | ICD-10-CM

## 2022-02-18 DIAGNOSIS — Z85850 Personal history of malignant neoplasm of thyroid: Secondary | ICD-10-CM

## 2022-02-18 DIAGNOSIS — E1165 Type 2 diabetes mellitus with hyperglycemia: Secondary | ICD-10-CM

## 2022-02-18 MED ORDER — TOUJEO MAX SOLOSTAR 300 UNIT/ML ~~LOC~~ SOPN: 60.0000 [IU] | PEN_INJECTOR | Freq: Every evening | SUBCUTANEOUS | 3 refills | Status: DC

## 2022-02-18 MED ORDER — LEVOTHYROXINE SODIUM 150 MCG PO TABS: 150.0000 ug | ORAL_TABLET | Freq: Every day | ORAL | 1 refills | Status: DC

## 2022-02-18 NOTE — Progress Notes (Signed)
? ?                                                    Endocrinology Follow Up Note  ?     02/18/2022, 10:08 AM ? ? ?Subjective:  ? ? Patient ID: [name], female    DOB: 06/28/1971.  ?[name] is being seen in follow up after being seen in consultation for management of currently uncontrolled symptomatic diabetes requested by  Redmond School, MD. ? ? ?Past Medical History:  ?Diagnosis Date  ? BV (bacterial vaginosis)   ? BV  ? Cancer [facility]) 2009  ? papillary thyroid cancer  ? Diabetes mellitus without complication ([place])   ? Family history of breast cancer   ? Hyperlipidemia   ? Hypertension   ? IBS (irritable bowel syndrome)   ? Thyroid disease   ? ? ?Past Surgical History:  ?Procedure Laterality Date  ? ABLATION    ? CARPAL TUNNEL RELEASE    ? CESAREAN SECTION    ? NECK AND CHEST LESION    ? THYROIDECTOMY    ? Right and Left, 05/2008, 09/2008  ? ? ?Social History  ? ?Socioeconomic History  ? Marital status: Married  ?  Spouse name: Not on file  ? Number of children: Not on file  ? Years of education: Not on file  ? Highest education level: Not on file  ?Occupational History  ? Not on file  ?Tobacco Use  ? Smoking status: Former  ? Smokeless tobacco: Never  ?Vaping Use  ? Vaping Use: Never used  ?Substance and Sexual Activity  ? Alcohol use: Yes  ?  Comment: occ  ? Drug use: Never  ? Sexual activity: Yes  ?  Comment: ablation  ?Other Topics Concern  ? Not on file  ?Social History Narrative  ? Not on file  ? ?Social Determinants of Health  ? ?Financial Resource Strain: Not on file  ?Food Insecurity: Not on file  ?Transportation Needs: Not on file  ?Physical Activity: Not on file  ?Stress: Not on file  ?Social Connections: Not on file  ? ? ?Family History  ?Problem Relation Age of Onset  ? Breast cancer Mother   ?     died at age 76  ? Mental illness Mother   ? Hypertension Father   ? Hyperlipidemia Father   ? Diabetes Father   ? Breast cancer Maternal Grandmother   ?     died in her late  44s  ? Lung cancer Maternal Uncle   ? ? ?Outpatient Encounter Medications as of 02/18/2022  ?Medication Sig  ? ALPRAZolam (XANAX) 0.5 MG tablet Take 0.5 mg by mouth 2 (two) times daily as needed.  ? atorvastatin (LIPITOR) 10 MG tablet Take 10 mg by mouth daily.  ? B-D UF III MINI PEN NEEDLES 31G X 5 MM MISC Inject 1 each into the skin 3 (three) times daily.  ? blood glucose meter kit and supplies KIT Dispense based on patient and insurance preference. Use up to four times daily as directed.  ? butalbital-acetaminophen-caffeine (FIORICET) 50-325-40 MG tablet Take 1 tablet by mouth 2 (two) times daily as needed.  ? Continuous Blood Gluc Receiver (FREESTYLE LIBRE 14 DAY READER) DEVI Use to check blood glucose 4 times daily.  ? Continuous Blood Gluc Sensor (DEXCOM G6 SENSOR)  MISC Change sensor every 10 days as directed  ? Continuous Blood Gluc Transmit (DEXCOM G6 TRANSMITTER) MISC Change transmitter every 90 days as directed.  ? diazepam (VALIUM) 2 MG tablet Take 2 mg by mouth 2 (two) times daily as needed.  ? Diethylpropion HCl 25 MG TABS Take 1 tablet by mouth 3 (three) times daily.  ? DULoxetine (CYMBALTA) 30 MG capsule Take 30 mg by mouth 2 (two) times daily.  ? fenofibrate micronized (LOFIBRA) 200 MG capsule Take 200 mg by mouth daily before breakfast.  ? fluconazole (DIFLUCAN) 100 MG tablet Take 100 mg by mouth daily.  ? gabapentin (NEURONTIN) 800 MG tablet Take 800 mg by mouth 4 (four) times daily.  ? Galcanezumab-gnlm (EMGALITY) 120 MG/ML SOAJ Inject 1 mL every month by subcutaneous route.  ? glimepiride (AMARYL) 4 MG tablet Take 1 tablet (4 mg total) by mouth daily.  ? HYDROcodone-acetaminophen (NORCO) 7.5-325 MG tablet Take 1 tablet by mouth every 8 (eight) hours as needed.  ? hydrOXYzine (ATARAX/VISTARIL) 25 MG tablet Take 25 mg by mouth every 4 (four) hours as needed.  ? insulin aspart (NOVOLOG FLEXPEN) 100 UNIT/ML FlexPen Inject 12-18 Units into the skin 3 (three) times daily with meals.  ? Insulin Pen  Needle (PEN NEEDLES) 31G X 8 MM MISC Use to inject insulin once daily  ? Lancets (ONETOUCH DELICA PLUS LPFXTK24O) MISC Apply 1 each topically 4 (four) times daily.  ? lisinopril (ZESTRIL) 2.5 MG tablet Take 2.5 mg by mouth daily.  ? meclizine (ANTIVERT) 25 MG tablet Take 25 mg by mouth 4 (four) times daily as needed.  ? Omega 3 1000 MG CAPS 1 capsule  ? ONETOUCH VERIO test strip 1 each 4 (four) times daily.  ? scopolamine (TRANSDERM-SCOP) 1 MG/3DAYS scopolamine 1 mg over 3 days transdermal patch ? Apply 1 patch by transdermal route as directed. ? Replace 1 every 3 days  ? traMADol (ULTRAM) 50 MG tablet Take 100 mg by mouth 3 (three) times daily as needed.  ? Vitamin D, Ergocalciferol, (DRISDOL) 1.25 MG (50000 UNIT) CAPS capsule Take 50,000 Units by mouth once a week.  ? [DISCONTINUED] insulin glargine, 2 Unit Dial, (TOUJEO MAX SOLOSTAR) 300 UNIT/ML Solostar Pen Inject 50 Units into the skin at bedtime.  ? [DISCONTINUED] levothyroxine (SYNTHROID) 150 MCG tablet Take 1 tablet (150 mcg total) by mouth daily.  ? insulin glargine, 2 Unit Dial, (TOUJEO MAX SOLOSTAR) 300 UNIT/ML Solostar Pen Inject 60 Units into the skin at bedtime.  ? ?No facility-administered encounter medications on file as of 02/18/2022.  ? ? ?ALLERGIES: ?Allergies  ?Allergen Reactions  ? Aspirin   ?  Up set stomach ?Other reaction(s): stomach upset  ? Elemental Sulfur Hives  ? Sulfur Dioxide Hives  ? Sulfamethoxazole-Trimethoprim Rash  ? ? ?VACCINATION STATUS: ? ?There is no immunization history on file for this patient. ? ?Diabetes ?She presents for her follow-up diabetic visit. She has type 2 diabetes mellitus. Onset time: had never been formally diagnosed- has gest DM with both children- started on meds about 1 year ago at age of 51. Her disease course has been improving. There are no hypoglycemic associated symptoms. Associated symptoms include blurred vision, fatigue, foot paresthesias, polydipsia and polyuria. There are no hypoglycemic  complications. Symptoms are improving. Diabetic complications include nephropathy and peripheral neuropathy. Risk factors for coronary artery disease include diabetes mellitus, dyslipidemia, family history, obesity, hypertension and sedentary lifestyle. Current diabetic treatment includes oral agent (monotherapy) and intensive insulin program. She is compliant with treatment most of  the time. Her weight is stable. She is following a generally unhealthy diet. When asked about meal planning, she reported none. She has not had a previous visit with a dietitian. She rarely participates in exercise. Her home blood glucose trend is fluctuating minimally. Her breakfast blood glucose range is generally >200 mg/dl. Her lunch blood glucose range is generally 180-200 mg/dl. Her dinner blood glucose range is generally >200 mg/dl. Her bedtime blood glucose range is generally >200 mg/dl. Her overall blood glucose range is >200 mg/dl. (She presents today with her CGM, no logs, still showing slowly improving glycemic profile.  She is not yet due for another A1c.  Analysis of her CGM shows TIR 19%, TAR 81%, TBR 0% with a GMI of 8.8%.  She denies any hypoglycemia.) An ACE inhibitor/angiotensin II receptor blocker is being taken. She does not see a podiatrist.Eye exam is current.  ? ? ?Review of systems ? ?Constitutional: + Minimally fluctuating body weight,  current Body mass index is 35.42 kg/m?. , + fatigue, no subjective hyperthermia, no subjective hypothermia ?Eyes: no blurry vision, no xerophthalmia ?ENT: no sore throat, no nodules palpated in throat, no dysphagia/odynophagia, no hoarseness ?Cardiovascular: no chest pain, no shortness of breath, no palpitations, no leg swelling ?Respiratory: no cough, no shortness of breath ?Gastrointestinal: no nausea/vomiting/diarrhea ?Musculoskeletal: no muscle/joint aches ?Skin: no rashes, no hyperemia ?Neurological: no tremors, no numbness, no tingling, intermittent dizziness- hx of  orthostatic hypotension ?Psychiatric: no depression, no anxiety ? ?Objective:  ?  ? ?BP 109/64   Pulse 67   Ht 5' 4.5" (1.638 m)   Wt 209 lb 9.6 oz (95.1 kg)   SpO2 99%   BMI 35.42 kg/m?   ?Wt Readings from Last 3 Encounter

## 2022-02-18 NOTE — Patient Instructions (Signed)

## 2022-02-21 ENCOUNTER — Other Ambulatory Visit (HOSPITAL_COMMUNITY): Payer: Self-pay | Admitting: Neurology

## 2022-02-21 DIAGNOSIS — M79641 Pain in right hand: Secondary | ICD-10-CM

## 2022-03-01 ENCOUNTER — Other Ambulatory Visit: Payer: Self-pay

## 2022-03-01 ENCOUNTER — Encounter: Payer: Self-pay | Admitting: Nutrition

## 2022-03-01 ENCOUNTER — Encounter: Payer: BC Managed Care – PPO | Attending: Nurse Practitioner | Admitting: Nutrition

## 2022-03-01 VITALS — Ht 64.0 in | Wt 210.0 lb

## 2022-03-01 DIAGNOSIS — E1165 Type 2 diabetes mellitus with hyperglycemia: Secondary | ICD-10-CM

## 2022-03-01 DIAGNOSIS — E782 Mixed hyperlipidemia: Secondary | ICD-10-CM

## 2022-03-01 DIAGNOSIS — E118 Type 2 diabetes mellitus with unspecified complications: Secondary | ICD-10-CM | POA: Insufficient documentation

## 2022-03-01 DIAGNOSIS — E66812 Obesity, class 2: Secondary | ICD-10-CM

## 2022-03-01 DIAGNOSIS — I10 Essential (primary) hypertension: Secondary | ICD-10-CM

## 2022-03-01 NOTE — Patient Instructions (Signed)
Goals ? ?Eat 2 carb choices per meal. ?Focus on more whole food plant based foods ? ?Drink 100 oz of water per day ?Increase plant  based foods ?Cut out processed foods. ?Check bS 4 times per day ?Measure foods out ?Don't take insulin unless eating a meal. ?Exercise 15 minutes per day ?Get A1C down to 7% ? ?

## 2022-03-01 NOTE — Progress Notes (Signed)
Medical Nutrition Therapy  ?Appointment Start time:  1330  Appointment End time:  1430 ? ?Primary concerns today: Dm Type 2 ?Referral diagnosis: E11.8 ?Preferred learning style: hands on  ?Learning readiness: Ready, changes in progresss  ? ? ?NUTRITION ASSESSMENT  ?53 y old female with Type 2 DM, HTN, Hyperlipidemia and Obesity. Here with her daughter. Sees Rayetta Pigg, [place]. A1C 13.2%. ?Has dexcom. Currently taking 60 units Toujeo, 12+ units of Novolog, Glimiperide 4 mg for her diabetes.  ?Hasn't met with a dietititan/diabetes educator before. Has had DM for 10 years probably. Had Gestational DM for both of her pregnancies. ? ?Is frustrated she is gaining weight even though she is trying to eat better.  ?Has been doing some keto foods which are not helping her weight or her blood sugars. ? ?Willing to make changes with diet and lifestyle. Willing to try to go more plant based lifestyle to help reverse her medical problems. ? ?Current diet is inconsistent to meet her needs to help control her BS and provide weight loss. ? ? ?Anthropometrics  ?Wt Readings from Last 3 Encounters:  ?03/01/22 210 lb (95.3 kg)  ?02/18/22 209 lb 9.6 oz (95.1 kg)  ?01/21/22 201 lb 9.6 oz (91.4 kg)  ? ?Ht Readings from Last 3 Encounters:  ?03/01/22 $RemoveBe'5\' 4"'loxofMryT$  (1.626 m)  ?02/18/22 5' 4.5" (1.638 m)  ?01/21/22 5' 4.5" (1.638 m)  ? ?Body mass index is 36.05 kg/m?. ?$RemoveBefor'@BMIFA'FiMFzONphVhT$ @ ?Facility age limit for growth percentiles is 20 years. ?Facility age limit for growth percentiles is 20 years. ?  ? ?Clinical ?Medical Hx: DM, HTN, Hyperlipidemia, Obesity ?Medications: See chart; Toujeo, Novolog and Glimiperide ?Labs:  ?Lab Results  ?Component Value Date  ? HGBA1C 13.2 01/07/2022  ? ? ?  Latest Ref Rng & Units 01/07/2022  ? 10:17 AM 08/05/2021  ? 12:00 AM 05/18/2011  ?  8:56 AM  ?CMP  ?Glucose 70 - 99 mg/dL 373    134    ?BUN 6 - 24 mg/dL $Remove'13   11      9    'gxrdTHf$ ?Creatinine 0.57 - 1.00 mg/dL 0.66   1.0      0.67    ?Sodium 134 - 144 mmol/L 130    140    ?Potassium 3.5  - 5.2 mmol/L 4.5    4.0    ?Chloride 96 - 106 mmol/L 90    105    ?CO2 20 - 29 mmol/L 21    25    ?Calcium 8.7 - 10.2 mg/dL 10.0    8.1    ?Total Protein 6.0 - 8.5 g/dL 8.0      ?Total Bilirubin 0.0 - 1.2 mg/dL 0.3      ?Alkaline Phos 44 - 121 IU/L 66      ?AST 0 - 40 IU/L 31   22        ?ALT 0 - 32 IU/L 69   53        ?  ? This result is from an external source.  ? ? ?Notable Signs/Symptoms: Increased thirst, tired, increased urination, depressed some ? ?Lifestyle & Dietary Hx ?Lives with her husband and family. She doesn't work. Cooks and shops for groceries. ? ?Estimated daily fluid intake: 30 oz ?Supplements: Adkins shakes, Vit D, Omega 3 ?Sleep: 5-7 ?Stress / self-care: her health issues stress her ?Current average weekly physical activity: ADL ? ?24-Hr Dietary Recall ?Eats 2-3 meals per day. Drinks diet sodas, flavored water, creamers with coffee ? ?Estimated Energy Needs ?Calories: 1200 ?  Carbohydrate: 135g ?Protein: 90g ?Fat: 33g ? ? ?NUTRITION DIAGNOSIS  ?NB-1.1 Food and nutrition-related knowledge deficit As related to Diabetes .  As evidenced by A1C 13.2. ? ? ?NUTRITION INTERVENTION  ?Nutrition education (E-1) on the following topics:  ?Nutrition and Diabetes education provided on My Plate, CHO counting, meal planning, portion sizes, timing of meals, avoiding snacks between meals unless having a low blood sugar, target ranges for A1C and blood sugars, signs/symptoms and treatment of hyper/hypoglycemia, monitoring blood sugars, taking medications as prescribed, benefits of exercising 30 minutes per day and prevention of complications of DM. ? ? ?Lifestyle Medicine ?- Whole Food, Plant Predominant Nutrition is highly recommended: Eat Plenty of vegetables, Mushrooms, fruits, Legumes, Whole Grains, Nuts, seeds in lieu of processed meats, processed snacks/pastries red meat, poultry, eggs.  ?  ?-It is better to avoid simple carbohydrates including: Cakes, Sweet Desserts, Ice Cream, Soda (diet and regular), Sweet  Tea, Candies, Chips, Cookies, Store Bought Juices, Alcohol in Excess of  1-2 drinks a day, Lemonade,  Artificial Sweeteners, Doughnuts, Coffee Creamers, "Sugar-free" Products, etc, etc.  This is not a complete list..... ? ?Exercise: If you are able: 30 -60 minutes a day ,4 days a week, or 150 minutes a week.  The longer the better.  Combine stretch, strength, and aerobic activities.  If you were told in the past that you have high risk for cardiovascular diseases, you may seek evaluation by your heart doctor prior to initiating moderate to intense exercise programs. ?Handouts Provided Include  ?Lifestyle Plant based meal plan ?Lifestyle nutrition ?Meal Plan Card ? ? ?Learning Style & Readiness for Change ?Teaching method utilized: Visual & Auditory  ?Demonstrated degree of understanding via: Teach Back  ?Barriers to learning/adherence to lifestyle change: none ? ?Goals Established by Pt ?Goals ? ?Eat 2 carb choices per meal. ?Focus on more whole food plant based foods ? ?Drink 100 oz of water per day ?Increase plant  based foods ?Cut out processed foods. ?Check bS 4 times per day ?Measure foods out ?Don't take insulin unless eating a meal. ?Exercise 15 minutes per day ?Get A1C down to 7% ? ? ?MONITORING & EVALUATION ?Dietary intake, weekly physical activity, and blood sugars and weight in 1 month. ? ?Next Steps  ?Patient is to work on meal planning and getting meals on time.. ? ?

## 2022-03-30 ENCOUNTER — Ambulatory Visit: Payer: BC Managed Care – PPO | Admitting: Nutrition

## 2022-04-06 ENCOUNTER — Other Ambulatory Visit: Payer: Self-pay | Admitting: Nurse Practitioner

## 2022-04-06 ENCOUNTER — Other Ambulatory Visit: Payer: Self-pay

## 2022-04-06 MED ORDER — TOUJEO MAX SOLOSTAR 300 UNIT/ML ~~LOC~~ SOPN: 60.0000 [IU] | PEN_INJECTOR | Freq: Every evening | SUBCUTANEOUS | 3 refills | Status: DC

## 2022-04-06 MED ORDER — DEXCOM G6 SENSOR MISC
3 refills | Status: DC
Start: 2022-04-06 — End: 2022-04-11

## 2022-04-06 MED ORDER — NOVOLOG FLEXPEN 100 UNIT/ML ~~LOC~~ SOPN: 12.0000 [IU] | PEN_INJECTOR | Freq: Three times a day (TID) | SUBCUTANEOUS | 3 refills | Status: DC

## 2022-04-11 ENCOUNTER — Other Ambulatory Visit (HOSPITAL_COMMUNITY): Payer: Self-pay | Admitting: Internal Medicine

## 2022-04-11 ENCOUNTER — Other Ambulatory Visit: Payer: Self-pay

## 2022-04-11 DIAGNOSIS — E6609 Other obesity due to excess calories: Secondary | ICD-10-CM | POA: Diagnosis not present

## 2022-04-11 DIAGNOSIS — E063 Autoimmune thyroiditis: Secondary | ICD-10-CM | POA: Diagnosis not present

## 2022-04-11 DIAGNOSIS — E1165 Type 2 diabetes mellitus with hyperglycemia: Secondary | ICD-10-CM | POA: Diagnosis not present

## 2022-04-11 DIAGNOSIS — E559 Vitamin D deficiency, unspecified: Secondary | ICD-10-CM | POA: Diagnosis not present

## 2022-04-11 DIAGNOSIS — Z1231 Encounter for screening mammogram for malignant neoplasm of breast: Secondary | ICD-10-CM

## 2022-04-11 DIAGNOSIS — Z6834 Body mass index (BMI) 34.0-34.9, adult: Secondary | ICD-10-CM | POA: Diagnosis not present

## 2022-04-11 DIAGNOSIS — Z1331 Encounter for screening for depression: Secondary | ICD-10-CM | POA: Diagnosis not present

## 2022-04-11 DIAGNOSIS — L03119 Cellulitis of unspecified part of limb: Secondary | ICD-10-CM | POA: Diagnosis not present

## 2022-04-11 DIAGNOSIS — E039 Hypothyroidism, unspecified: Secondary | ICD-10-CM | POA: Diagnosis not present

## 2022-04-11 DIAGNOSIS — Z0001 Encounter for general adult medical examination with abnormal findings: Secondary | ICD-10-CM | POA: Diagnosis not present

## 2022-04-11 MED ORDER — TOUJEO MAX SOLOSTAR 300 UNIT/ML ~~LOC~~ SOPN: PEN_INJECTOR | SUBCUTANEOUS | 0 refills | Status: DC

## 2022-04-11 MED ORDER — DEXCOM G6 SENSOR MISC
0 refills | Status: DC
Start: 2022-04-11 — End: 2022-04-25

## 2022-04-12 ENCOUNTER — Telehealth: Payer: Self-pay | Admitting: Nurse Practitioner

## 2022-04-12 NOTE — Telephone Encounter (Signed)
Left msg for pt to return call.

## 2022-04-12 NOTE — Telephone Encounter (Signed)
I cannot see any records from [facility] office so I am not sure if he was referring to both the diabetes and her thyroid needing med adjustments or not.  Can you call her and find out what specifically he was referring to?  Has her weight changed any recently or missed any doses of her Levothyroxine? Because if she gains weight, she will likely need more thyroid hormone which is what her TSH is suggesting she needs.  I also took a peek at her glucose readings on Dexcom and see she is running a bit higher than she was.  Encourage her to stick to 3 meals per day, no snacking, no sugary beverages, and incorporate more physical exercise.

## 2022-04-12 NOTE — Telephone Encounter (Signed)
Pt states she had her physical yesterday with her PCP and according to her PCP, he thinks she needs her medication increased. Laid results on your desk ?

## 2022-04-13 ENCOUNTER — Ambulatory Visit (HOSPITAL_COMMUNITY): Payer: BC Managed Care – PPO

## 2022-04-13 DIAGNOSIS — L0100 Impetigo, unspecified: Secondary | ICD-10-CM | POA: Diagnosis not present

## 2022-04-13 MED ORDER — LEVOTHYROXINE SODIUM 175 MCG PO TABS: 175.0000 ug | ORAL_TABLET | Freq: Every day | ORAL | 3 refills | Status: DC

## 2022-04-13 NOTE — Telephone Encounter (Signed)
Pt returning your call

## 2022-04-13 NOTE — Telephone Encounter (Signed)
Pt states that her weight was 212lbs just a few days ago. Was 209lbs at her last OV here. Dr Gerarda Fraction was wanting to raise the Levothyroxine dosage to 154mg but she wanted Whitney to handle this.  ?

## 2022-04-13 NOTE — Telephone Encounter (Signed)
Pt.notified

## 2022-04-13 NOTE — Telephone Encounter (Signed)
That is not too much of an increase in her weight but her levels are higher than I would like.  Yes, we can increase Levothyroxine to 175 mcg po daily before breakfast.  I sent in the new dose to [facility] so she can pick it up and start tomorrow.

## 2022-04-21 ENCOUNTER — Other Ambulatory Visit: Payer: Self-pay

## 2022-04-21 MED ORDER — LEVOTHYROXINE SODIUM 175 MCG PO TABS: 175.0000 ug | ORAL_TABLET | Freq: Every day | ORAL | 0 refills | Status: DC

## 2022-04-25 ENCOUNTER — Other Ambulatory Visit: Payer: Self-pay

## 2022-04-25 DIAGNOSIS — Z1211 Encounter for screening for malignant neoplasm of colon: Secondary | ICD-10-CM | POA: Diagnosis not present

## 2022-04-25 DIAGNOSIS — Z1212 Encounter for screening for malignant neoplasm of rectum: Secondary | ICD-10-CM | POA: Diagnosis not present

## 2022-04-25 MED ORDER — PEN NEEDLES 31G X 8 MM MISC
1 refills | Status: DC
Start: 2022-04-25 — End: 2022-05-04

## 2022-04-25 MED ORDER — BD PEN NEEDLE MINI U/F 31G X 5 MM MISC: 1.0000 | Freq: Three times a day (TID) | 0 refills | Status: DC

## 2022-04-25 MED ORDER — DEXCOM G6 TRANSMITTER MISC: 1 refills | Status: DC

## 2022-04-25 MED ORDER — DEXCOM G6 SENSOR MISC
1 refills | Status: DC
Start: 2022-04-25 — End: 2022-06-13

## 2022-04-26 ENCOUNTER — Other Ambulatory Visit: Payer: Self-pay | Admitting: Nurse Practitioner

## 2022-04-28 DIAGNOSIS — Z0000 Encounter for general adult medical examination without abnormal findings: Secondary | ICD-10-CM | POA: Diagnosis not present

## 2022-04-28 DIAGNOSIS — L02435 Carbuncle of right lower limb: Secondary | ICD-10-CM | POA: Diagnosis not present

## 2022-05-04 ENCOUNTER — Telehealth: Payer: Self-pay | Admitting: Nurse Practitioner

## 2022-05-04 ENCOUNTER — Other Ambulatory Visit: Payer: Self-pay

## 2022-05-04 MED ORDER — BD PEN NEEDLE MINI U/F 31G X 5 MM MISC: 1.0000 | Freq: Four times a day (QID) | 0 refills | Status: DC

## 2022-05-04 NOTE — Telephone Encounter (Signed)
Spoke to pharmacy. Switched to one pen needle.

## 2022-05-04 NOTE — Telephone Encounter (Signed)
[facility] left a VM wanting to Clarify the Pen Needles. Does patient want the mini needles or the short needles? Please call back at [phone] ref # [id number]

## 2022-05-04 NOTE — Telephone Encounter (Signed)
Spoke to pharmacy. Pt had one for mealtime insulin and another for qhs insulin. This was switched to the same pen needle.

## 2022-05-09 ENCOUNTER — Ambulatory Visit: Payer: BC Managed Care – PPO | Admitting: Obstetrics & Gynecology

## 2022-05-11 ENCOUNTER — Other Ambulatory Visit (HOSPITAL_COMMUNITY): Payer: Self-pay | Admitting: Internal Medicine

## 2022-05-11 DIAGNOSIS — Z1231 Encounter for screening mammogram for malignant neoplasm of breast: Secondary | ICD-10-CM

## 2022-05-19 DIAGNOSIS — L0100 Impetigo, unspecified: Secondary | ICD-10-CM | POA: Diagnosis not present

## 2022-05-20 ENCOUNTER — Ambulatory Visit (HOSPITAL_COMMUNITY)
Admission: RE | Admit: 2022-05-20 | Discharge: 2022-05-20 | Disposition: A | Discharge status: Home / Self Care | Payer: BC Managed Care – PPO | Source: Ambulatory Visit | Attending: Neurology | Admitting: Neurology

## 2022-05-20 ENCOUNTER — Ambulatory Visit (HOSPITAL_COMMUNITY)
Admission: RE | Admit: 2022-05-20 | Discharge: 2022-05-20 | Disposition: A | Discharge status: Home / Self Care | Payer: BC Managed Care – PPO | Source: Ambulatory Visit

## 2022-05-20 ENCOUNTER — Ambulatory Visit (HOSPITAL_COMMUNITY)
Admission: RE | Admit: 2022-05-20 | Discharge: 2022-05-20 | Disposition: A | Discharge status: Home / Self Care | Payer: BC Managed Care – PPO | Source: Ambulatory Visit | Attending: Nurse Practitioner | Admitting: Nurse Practitioner

## 2022-05-20 DIAGNOSIS — E890 Postprocedural hypothyroidism: Secondary | ICD-10-CM | POA: Diagnosis not present

## 2022-05-20 DIAGNOSIS — Z1231 Encounter for screening mammogram for malignant neoplasm of breast: Secondary | ICD-10-CM | POA: Diagnosis not present

## 2022-05-20 DIAGNOSIS — M85841 Other specified disorders of bone density and structure, right hand: Secondary | ICD-10-CM | POA: Diagnosis not present

## 2022-05-20 DIAGNOSIS — M79641 Pain in right hand: Secondary | ICD-10-CM | POA: Insufficient documentation

## 2022-05-20 DIAGNOSIS — E041 Nontoxic single thyroid nodule: Secondary | ICD-10-CM | POA: Diagnosis not present

## 2022-05-20 DIAGNOSIS — Z85850 Personal history of malignant neoplasm of thyroid: Secondary | ICD-10-CM | POA: Diagnosis not present

## 2022-05-26 NOTE — Patient Instructions (Incomplete)
Diabetes Mellitus and Foot Care Foot care is an important part of your health, especially when you have diabetes. Diabetes may cause you to have problems because of poor blood flow (circulation) to your feet and legs, which can cause your skin to: Become thinner and drier. Break more easily. Heal more slowly. Peel and crack. You may also have nerve damage (neuropathy) in your legs and feet, causing decreased feeling in them. This means that you may not notice minor injuries to your feet that could lead to more serious problems. Noticing and addressing any potential problems early is the best way to prevent future foot problems. How to care for your feet Foot hygiene  Wash your feet daily with warm water and mild soap. Do not use hot water. Then, pat your feet and the areas between your toes until they are completely dry. Do not soak your feet as this can dry your skin. Trim your toenails straight across. Do not dig under them or around the cuticle. File the edges of your nails with an emery board or nail file. Apply a moisturizing lotion or petroleum jelly to the skin on your feet and to dry, brittle toenails. Use lotion that does not contain alcohol and is unscented. Do not apply lotion between your toes. Shoes and socks Wear clean socks or stockings every day. Make sure they are not too tight. Do not wear knee-high stockings since they may decrease blood flow to your legs. Wear shoes that fit properly and have enough cushioning. Always look in your shoes before you put them on to be sure there are no objects inside. To break in new shoes, wear them for just a few hours a day. This prevents injuries on your feet. Wounds, scrapes, corns, and calluses  Check your feet daily for blisters, cuts, bruises, sores, and redness. If you cannot see the bottom of your feet, use a mirror or ask someone for help. Do not cut corns or calluses or try to remove them with medicine. If you find a minor scrape,  cut, or break in the skin on your feet, keep it and the skin around it clean and dry. You may clean these areas with mild soap and water. Do not clean the area with peroxide, alcohol, or iodine. If you have a wound, scrape, corn, or callus on your foot, look at it several times a day to make sure it is healing and not infected. Check for: Redness, swelling, or pain. Fluid or blood. Warmth. Pus or a bad smell. General tips Do not cross your legs. This may decrease blood flow to your feet. Do not use heating pads or hot water bottles on your feet. They may burn your skin. If you have lost feeling in your feet or legs, you may not know this is happening until it is too late. Protect your feet from hot and cold by wearing shoes, such as at the beach or on hot pavement. Schedule a complete foot exam at least once a year (annually) or more often if you have foot problems. Report any cuts, sores, or bruises to your health care provider immediately. Where to find more information American Diabetes Association: www.diabetes.org Association of Diabetes Care & Education Specialists: www.diabeteseducator.org Contact a health care provider if: You have a medical condition that increases your risk of infection and you have any cuts, sores, or bruises on your feet. You have an injury that is not healing. You have redness on your legs or feet. You   feel burning or tingling in your legs or feet. You have pain or cramps in your legs and feet. Your legs or feet are numb. Your feet always feel cold. You have pain around any toenails. Get help right away if: You have a wound, scrape, corn, or callus on your foot and: You have pain, swelling, or redness that gets worse. You have fluid or blood coming from the wound, scrape, corn, or callus. Your wound, scrape, corn, or callus feels warm to the touch. You have pus or a bad smell coming from the wound, scrape, corn, or callus. You have a fever. You have a red  line going up your leg. Summary Check your feet every day for blisters, cuts, bruises, sores, and redness. Apply a moisturizing lotion or petroleum jelly to the skin on your feet and to dry, brittle toenails. Wear shoes that fit properly and have enough cushioning. If you have foot problems, report any cuts, sores, or bruises to your health care provider immediately. Schedule a complete foot exam at least once a year (annually) or more often if you have foot problems. This information is not intended to replace advice given to you by your health care provider. Make sure you discuss any questions you have with your health care provider. Document Revised: 06/11/2020 Document Reviewed: 06/11/2020 Elsevier Patient Education  2023 Elsevier Inc.  

## 2022-05-27 ENCOUNTER — Ambulatory Visit (INDEPENDENT_AMBULATORY_CARE_PROVIDER_SITE_OTHER): Payer: BC Managed Care – PPO | Admitting: Nurse Practitioner

## 2022-05-27 ENCOUNTER — Encounter: Payer: Self-pay | Admitting: Nurse Practitioner

## 2022-05-27 VITALS — BP 121/76 | HR 77 | Ht 64.5 in | Wt 212.0 lb

## 2022-05-27 DIAGNOSIS — Z85850 Personal history of malignant neoplasm of thyroid: Secondary | ICD-10-CM

## 2022-05-27 DIAGNOSIS — E1165 Type 2 diabetes mellitus with hyperglycemia: Secondary | ICD-10-CM

## 2022-05-27 DIAGNOSIS — R221 Localized swelling, mass and lump, neck: Secondary | ICD-10-CM

## 2022-05-27 DIAGNOSIS — E890 Postprocedural hypothyroidism: Secondary | ICD-10-CM | POA: Diagnosis not present

## 2022-05-27 LAB — POCT GLYCOSYLATED HEMOGLOBIN (HGB A1C): Hemoglobin A1C: 8.9 % — AB (ref 4.0–5.6)

## 2022-05-27 MED ORDER — TOUJEO MAX SOLOSTAR 300 UNIT/ML ~~LOC~~ SOPN: PEN_INJECTOR | SUBCUTANEOUS | 0 refills | Status: DC

## 2022-06-11 ENCOUNTER — Other Ambulatory Visit: Payer: Self-pay | Admitting: Nurse Practitioner

## 2022-06-11 ENCOUNTER — Other Ambulatory Visit: Payer: Self-pay | Admitting: "Endocrinology

## 2022-06-16 DIAGNOSIS — M542 Cervicalgia: Secondary | ICD-10-CM | POA: Diagnosis not present

## 2022-06-16 DIAGNOSIS — Z79891 Long term (current) use of opiate analgesic: Secondary | ICD-10-CM | POA: Diagnosis not present

## 2022-06-16 DIAGNOSIS — Z79899 Other long term (current) drug therapy: Secondary | ICD-10-CM | POA: Diagnosis not present

## 2022-06-16 DIAGNOSIS — G43701 Chronic migraine without aura, not intractable, with status migrainosus: Secondary | ICD-10-CM | POA: Diagnosis not present

## 2022-06-16 DIAGNOSIS — M79606 Pain in leg, unspecified: Secondary | ICD-10-CM | POA: Diagnosis not present

## 2022-06-17 ENCOUNTER — Ambulatory Visit: Payer: BC Managed Care – PPO | Admitting: Obstetrics & Gynecology

## 2022-07-08 ENCOUNTER — Ambulatory Visit (HOSPITAL_COMMUNITY)
Admission: RE | Admit: 2022-07-08 | Discharge: 2022-07-08 | Disposition: A | Discharge status: Home / Self Care | Payer: BC Managed Care – PPO | Source: Ambulatory Visit | Attending: Nurse Practitioner | Admitting: Nurse Practitioner

## 2022-07-08 DIAGNOSIS — Z85850 Personal history of malignant neoplasm of thyroid: Secondary | ICD-10-CM | POA: Insufficient documentation

## 2022-07-08 DIAGNOSIS — R221 Localized swelling, mass and lump, neck: Secondary | ICD-10-CM | POA: Diagnosis not present

## 2022-07-08 DIAGNOSIS — K118 Other diseases of salivary glands: Secondary | ICD-10-CM | POA: Diagnosis not present

## 2022-07-08 LAB — POCT I-STAT CREATININE: Creatinine, Ser: 1 mg/dL (ref 0.44–1.00)

## 2022-07-08 MED ORDER — IOHEXOL 300 MG/ML  SOLN
75.0000 mL | Freq: Once | INTRAMUSCULAR | Status: AC | PRN
  Administered 2022-07-08: 75 mL via INTRAVENOUS

## 2022-07-11 ENCOUNTER — Telehealth: Payer: Self-pay | Admitting: Nurse Practitioner

## 2022-07-11 NOTE — Telephone Encounter (Signed)
[name] with [name] left a VM asking if we received a fax from [employer] for [name]. Call back # [phone]

## 2022-07-11 NOTE — Telephone Encounter (Signed)
Spoke with [name]. Let her know we never received anything and that this was not prescribed at the last visit

## 2022-07-11 NOTE — Progress Notes (Signed)
Should I order a biopsy of this suspicious lesion?

## 2022-07-21 ENCOUNTER — Encounter: Payer: Self-pay | Admitting: Nurse Practitioner

## 2022-07-21 DIAGNOSIS — Z85858 Personal history of malignant neoplasm of other endocrine glands: Secondary | ICD-10-CM

## 2022-07-21 DIAGNOSIS — R221 Localized swelling, mass and lump, neck: Secondary | ICD-10-CM

## 2022-07-21 DIAGNOSIS — L729 Follicular cyst of the skin and subcutaneous tissue, unspecified: Secondary | ICD-10-CM

## 2022-08-04 DIAGNOSIS — E063 Autoimmune thyroiditis: Secondary | ICD-10-CM | POA: Diagnosis not present

## 2022-08-04 DIAGNOSIS — D6949 Other primary thrombocytopenia: Secondary | ICD-10-CM | POA: Diagnosis not present

## 2022-08-04 DIAGNOSIS — E119 Type 2 diabetes mellitus without complications: Secondary | ICD-10-CM | POA: Diagnosis not present

## 2022-08-04 DIAGNOSIS — Z6834 Body mass index (BMI) 34.0-34.9, adult: Secondary | ICD-10-CM | POA: Diagnosis not present

## 2022-08-04 DIAGNOSIS — R2242 Localized swelling, mass and lump, left lower limb: Secondary | ICD-10-CM | POA: Diagnosis not present

## 2022-08-04 DIAGNOSIS — E6609 Other obesity due to excess calories: Secondary | ICD-10-CM | POA: Diagnosis not present

## 2022-08-15 NOTE — Addendum Note (Signed)
Addended by: Brita Romp on: 08/15/2022 07:29 AM   Modules accepted: Orders

## 2022-08-29 ENCOUNTER — Other Ambulatory Visit: Payer: Self-pay | Admitting: *Deleted

## 2022-08-29 DIAGNOSIS — D1724 Benign lipomatous neoplasm of skin and subcutaneous tissue of left leg: Secondary | ICD-10-CM

## 2022-08-30 ENCOUNTER — Other Ambulatory Visit: Payer: Self-pay | Admitting: Nurse Practitioner

## 2022-09-01 ENCOUNTER — Telehealth: Payer: Self-pay | Admitting: Nurse Practitioner

## 2022-09-01 NOTE — Telephone Encounter (Signed)
Pt's last office visit she's instructed to take toujeo 70 units qhs but I did see where you messaged pt afterwards to increase toujeo to 80 units qhs. Just want to clarify which dose you would like pt taking.

## 2022-09-01 NOTE — Telephone Encounter (Signed)
[facility] left a VM stating the directions do not make sense for her Toujeo. They can not fill the prescrption with the way it is written. They would like a call back at [phone]. Reference number [id number]

## 2022-09-02 ENCOUNTER — Ambulatory Visit: Payer: BC Managed Care – PPO | Admitting: Nurse Practitioner

## 2022-09-02 NOTE — Telephone Encounter (Signed)
Advised [facility] that pt is taking toujeo 80 units sq qhs. Understanding voiced.

## 2022-09-06 ENCOUNTER — Encounter: Payer: Self-pay | Admitting: General Surgery

## 2022-09-06 ENCOUNTER — Ambulatory Visit (INDEPENDENT_AMBULATORY_CARE_PROVIDER_SITE_OTHER): Payer: BC Managed Care – PPO | Admitting: General Surgery

## 2022-09-06 VITALS — BP 135/78 | HR 69 | Temp 98.3°F | Resp 14 | Ht 64.5 in | Wt 219.0 lb

## 2022-09-06 DIAGNOSIS — R221 Localized swelling, mass and lump, neck: Secondary | ICD-10-CM | POA: Diagnosis not present

## 2022-09-06 DIAGNOSIS — D1724 Benign lipomatous neoplasm of skin and subcutaneous tissue of left leg: Secondary | ICD-10-CM | POA: Diagnosis not present

## 2022-09-06 NOTE — Patient Instructions (Signed)
Epidermoid Cyst Removal Epidermoid cyst removal is a procedure to remove a fluid-filled sac that forms under your skin (epidermoid cyst). This type of cyst is filled with a thick, oily substance (keratin) that is secreted by your skin glands. Epidermoid cysts may also be called epidermal cysts, or keratin cysts. Normally, the skin secretes this pasty material through a gland or a hair follicle. However, when a skin gland or hair follicle becomes blocked, an epidermoid cyst can form. You may need this procedure if you have an epidermal cyst that becomes large, uncomfortable, or inflamed. Tell a health care provider about: Any allergies you have. All medicines you are taking, including vitamins, herbs, eye drops, creams, and over-the-counter medicines. Any problems you or family members have had with anesthetic medicines. Any blood disorders you have. Any surgeries you have had. Any medical conditions you have now or have had. Whether you are pregnant or may be pregnant. What are the risks? Generally, this is a safe procedure. However, problems may occur, including: Recurrence of the cyst. Bleeding. Infection. Scarring. What happens before the procedure? Ask your health care provider about: Changing or stopping your regular medicines. This is especially important if you are taking diabetes medicines or blood thinners. Taking medicines such as aspirin and ibuprofen. These medicines can thin your blood. Do not take these medicines unless your health care provider tells you to take them. Taking over-the-counter medicines, vitamins, herbs, and supplements. If you have an inflamed or infected cyst, you may have to take antibiotic medicine before the cyst removal. Take your antibiotic as told by your health care provider. Do not stop taking the antibiotic even if you start to feel better. Take a shower on the morning of your procedure. Your health care provider may ask you to use a germ-killing  soap. What happens during the procedure?  You will be given a medicine to numb the area (local anesthetic). The skin around the cyst will be cleaned with a germ-killing solution. The health care provider will make a small incision in your skin over the cyst. The health care provider will separate the cyst from the surrounding tissues that are under your skin. If possible, the cyst will be removed undamaged (intact). If the cyst bursts (ruptures), it will be removed in pieces. After the cyst is removed, the health care provider will control any bleeding and close the incision with small stitches (sutures). Small incisions may not need sutures, and the bleeding will be controlled by applying direct pressure with gauze. The health care provider may apply antibiotic ointment and a bandage (dressing) over the incision. The procedure may vary among health care providers and hospitals. What happens after the procedure? If you are prescribed an antibiotic medicine or ointment, take or apply it as told by your health care provider. Do not stop using the antibiotic even if you start to feel better. Summary Epidermoid cyst removal is a procedure to remove a sac that has formed under your skin. You may need this procedure if you have an epidermoid cyst that becomes large, uncomfortable, or inflamed. The health care provider will make a small incision in your skin to remove the cyst. If you are prescribed an antibiotic medicine before the procedure, after the procedure, or both, use the antibiotic as told by your health care provider. Do not stop using the antibiotic even if you start to feel better. This information is not intended to replace advice given to you by your health care provider. Make sure  you discuss any questions you have with your health care provider. Document Revised: 02/26/2020 Document Reviewed: 02/26/2020 Elsevier Patient Education  [place].   Lipoma Removal  Lipoma  removal is a surgical procedure to remove a lipoma, which is a noncancerous (benign) tumor that is made up of fat cells. Most lipomas are small and painless and do not require treatment. They can form in many areas of the body but are most common under the skin of the back, arms, shoulders, buttocks, and thighs. You may need lipoma removal if you have a lipoma that is large, growing, or causing discomfort. Lipoma removal may also be done for cosmetic reasons. Tell a health care provider about: Any allergies you have. All medicines you are taking, including vitamins, herbs, eye drops, creams, and over-the-counter medicines. Any problems you or family members have had with anesthetic medicines. Any bleeding problems you have. Any surgeries you have had. Any medical conditions you have. Whether you are pregnant or may be pregnant. What are the risks? Generally, this is a safe procedure. However, problems may occur, including: Infection. Bleeding. Scarring. Allergic reactions to medicines. Damage to nearby structures or organs, such as damage to nerves or blood vessels near the lipoma. What happens before the procedure? General instructions You will have a physical exam. Your health care provider will check the size of the lipoma and whether it can be removed easily. You may have a biopsy and imaging tests, such as X-rays, a CT scan, and an MRI. Do not use any products that contain nicotine or tobacco for at least 4 weeks before the procedure. These products include cigarettes, chewing tobacco, and vaping devices, such as e-cigarettes. If you need help quitting, ask your health care provider. Ask your health care provider: How your surgery site will be marked. What steps will be taken to help prevent infection. These may include: Washing skin with a germ-killing soap. Taking antibiotic medicine. If you will be going home right after the procedure, plan to have a responsible adult: Take you  home from the hospital or clinic. You will not be allowed to drive. Care for you for the time you are told. What happens during the procedure?  An IV will be inserted into one of your veins. You will be given one or more of the following: A medicine to help you relax (sedative). A medicine to numb the area (local anesthetic). A medicine to make you fall asleep (general anesthetic). A medicine that is injected into an area of your body to numb everything below the injection site (regional anesthetic). An incision will be made into the skin over the lipoma or very near the lipoma. The incision may be made in a natural skin line or crease. Tissues, nerves, and blood vessels near the lipoma will be moved out of the way. The lipoma and the capsule that surrounds it will be separated from the surrounding tissues. The lipoma will be removed. The incision may be closed with stitches (sutures). A bandage (dressing) will be placed over the incision. The procedure may vary among health care providers and hospitals. What happens after the procedure? Your blood pressure, heart rate, breathing rate, and blood oxygen level will be monitored until you leave the hospital or clinic. If you were prescribed an antibiotic medicine, use it as told by your health care provider. Do not stop using the antibiotic even if you start to feel better. If you were given a sedative during the procedure, it can affect  you for several hours. Do not drive or operate machinery until your health care provider says that it is safe. Where to find more information OrthoInfo: orthoinfo.aaos.org Summary Before the procedure, follow instructions from your health care provider about eating and drinking, and changing or stopping your regular medicines. This is especially important if you are taking diabetes medicines or blood thinners. After the lipoma is removed, the incision may be closed with stitches (sutures) and covered with a  bandage (dressing). If you were given a sedative during the procedure, it can affect you for several hours. Do not drive or operate machinery until your health care provider says that it is safe. This information is not intended to replace advice given to you by your health care provider. Make sure you discuss any questions you have with your health care provider. Document Revised: 12/10/2021 Document Reviewed: 12/10/2021 Elsevier Patient Education  [place].

## 2022-09-06 NOTE — Progress Notes (Unsigned)
[facility] History and Physical  Reason for Referral:*** Referring Physician: ***  Chief Complaint   New Patient (Initial Visit)     [name] is a 51 y.o. female.  HPI: ***.  The *** started *** and has had a duration of ***.  It is associated with ***.  The *** is improved with ***, and is made worse with ***.    Quality*** Context***  Past Medical History:  Diagnosis Date  . BV (bacterial vaginosis)    BV  . Cancer [facility]) 2009   papillary thyroid cancer  . Diabetes mellitus without complication (HCC)   . Family history of breast cancer   . Hyperlipidemia   . Hypertension   . IBS (irritable bowel syndrome)   . Thyroid disease     Past Surgical History:  Procedure Laterality Date  . ABLATION    . CARPAL TUNNEL RELEASE    . CESAREAN SECTION    . NECK AND CHEST LESION    . THYROIDECTOMY     Right and Left, 05/2008, 09/2008    Family History  Problem Relation Age of Onset  . Breast cancer Mother        died at age 13  . Mental illness Mother   . Hypertension Father   . Hyperlipidemia Father   . Diabetes Father   . Breast cancer Maternal Grandmother        died in her late 89s  . Lung cancer Maternal Uncle     Social History   Tobacco Use  . Smoking status: Former  . Smokeless tobacco: Never  Vaping Use  . Vaping Use: Never used  Substance Use Topics  . Alcohol use: Yes    Comment: occ  . Drug use: Never    Medications: {medication reviewed/display:3041432} Allergies as of 09/06/2022       Reactions   Aspirin    Up set stomach Other reaction(s): stomach upset   Elemental Sulfur Hives   Sulfur Dioxide Hives   Sulfamethoxazole-trimethoprim Rash        Medication List        Accurate as of September 06, 2022 10:43 AM. If you have any questions, ask your nurse or doctor.          ALPRAZolam 0.5 MG tablet Commonly known as: XANAX Take 0.5 mg by mouth 2 (two) times daily as needed.   atorvastatin 20 MG  tablet Commonly known as: LIPITOR Take 20 mg by mouth daily.   B-D UF III MINI PEN NEEDLES 31G X 5 MM Misc Generic drug: Insulin Pen Needle USE AND DISCARD 1 PEN      NEEDLE IN THE MORNING, AT   NOON, IN THE EVENING, AND  AT BEDTIME   B-D UF III MINI PEN NEEDLES 31G X 5 MM Misc Generic drug: Insulin Pen Needle USE AND DISCARD 1 PEN      NEEDLE FOR SUBCUTANEOUS    INJECTION 3 TIMES A DAY   blood glucose meter kit and supplies Kit Dispense based on patient and insurance preference. Use up to four times daily as directed.   butalbital-acetaminophen-caffeine 50-325-40 MG tablet Commonly known as: FIORICET Take 1 tablet by mouth 2 (two) times daily as needed.   Dexcom G6 Sensor Misc CHANGE SENSOR EVERY 10 DAYSAS DIRECTED   Dexcom G6 Transmitter Misc CHANGE TRANSMITTER EVERY 90DAYS AS DIRECTED   diazepam 2 MG tablet Commonly known as: VALIUM Take 2 mg by mouth 2 (two) times daily as needed.  DULoxetine 30 MG capsule Commonly known as: CYMBALTA Take 30 mg by mouth 2 (two) times daily.   Emgality 120 MG/ML Soaj Generic drug: Galcanezumab-gnlm Inject 1 mL every month by subcutaneous route.   fenofibrate micronized 200 MG capsule Commonly known as: LOFIBRA Take 200 mg by mouth daily before breakfast.   fluconazole 100 MG tablet Commonly known as: DIFLUCAN Take 100 mg by mouth daily.   FreeStyle Libre 14 Day Reader Hardie Pulley Use to check blood glucose 4 times daily.   gabapentin 800 MG tablet Commonly known as: NEURONTIN Take 800 mg by mouth 4 (four) times daily.   glimepiride 4 MG tablet Commonly known as: AMARYL Take 1 tablet (4 mg total) by mouth daily.   HYDROcodone-acetaminophen 7.5-325 MG tablet Commonly known as: NORCO Take 1 tablet by mouth every 8 (eight) hours as needed.   hydrOXYzine 25 MG tablet Commonly known as: ATARAX Take 25 mg by mouth every 4 (four) hours as needed.   lisinopril 2.5 MG tablet Commonly known as: ZESTRIL Take 2.5 mg by mouth daily.    meclizine 25 MG tablet Commonly known as: ANTIVERT Take 25 mg by mouth 4 (four) times daily as needed.   NovoLOG FlexPen 100 UNIT/ML FlexPen Generic drug: insulin aspart Inject 12-18 Units into the skin 3 (three) times daily with meals.   Omega 3 1000 MG Caps 1 capsule   OneTouch Delica Plus Lancet33G Misc Apply 1 each topically 4 (four) times daily.   OneTouch Verio test strip Generic drug: glucose blood 1 each 4 (four) times daily.   scopolamine 1 MG/3DAYS Commonly known as: TRANSDERM-SCOP scopolamine 1 mg over 3 days transdermal patch  Apply 1 patch by transdermal route as directed.  Replace 1 every 3 days   Synthroid 175 MCG tablet Generic drug: levothyroxine TAKE 1 TABLET DAILY   Toujeo Max SoloStar 300 UNIT/ML Solostar Pen Generic drug: insulin glargine (2 Unit Dial) Inject 70 Units into the skin at bedtime. INJECT 60 UNITS            SUBCUTANEOUSLY AT BEDTIME What changed:  how much to take additional instructions   traMADol 50 MG tablet Commonly known as: ULTRAM Take 100 mg by mouth 3 (three) times daily as needed.   Vitamin D (Ergocalciferol) 1.25 MG (50000 UNIT) Caps capsule Commonly known as: DRISDOL Take 50,000 Units by mouth once a week.         ROS:  {Review of Systems:30496}  Blood pressure 135/78, pulse 69, temperature 98.3 F (36.8 C), temperature source Oral, resp. rate 14, height 5' 4.5" (1.638 m), weight 219 lb (99.3 kg), SpO2 97 %. Physical Exam  Results: No results found for this or any previous visit (from the past 48 hour(s)).  No results found.   Assessment & Plan:  [name] is a 51 y.o. female with *** -*** -*** -Follow up ***  All questions were answered to the satisfaction of the patient and family***.  The risk and benefits of *** were discussed including but not limited to ***.  After careful consideration, [name] has decided to ***.    Lucretia Roers 09/06/2022, 10:43 AM

## 2022-09-07 NOTE — Patient Instructions (Addendum)
Your procedure is scheduled on: 09/12/2022  Report to [facility] at    6:00 AM.  Call this number if you have problems the morning of surgery: [phone]   Remember:   Do not Eat or Drink after midnight         No Smoking the morning of surgery  :  Take these medicines the morning of surgery with A SIP OF WATER: Cymbalta and synthroid  Valium, xanax, hydrocodone and/or tramadol if needed  No diabetic medication am of surgery  Take only 1/2 dose [place] insulin 30 units the night before surgery   Do not wear jewelry, make-up or nail polish.  Do not wear lotions, powders, or perfumes. You may wear deodorant.  Do not shave 48 hours prior to surgery. Men may shave face and neck.  Do not bring valuables to the hospital.  Contacts, dentures or bridgework may not be worn into surgery.  Leave suitcase in the car. After surgery it may be brought to your room.  For patients admitted to the hospital, checkout time is 11:00 AM the day of discharge.   Patients discharged the day of surgery will not be allowed to drive home.    Special Instructions: Shower using CHG night before surgery and shower the day of surgery use CHG.  Use special wash - you have one bottle of CHG for all showers.  You should use approximately 1/2 of the bottle for each shower.  How to Use Chlorhexidine Before Surgery Chlorhexidine gluconate (CHG) is a germ-killing (antiseptic) solution that is used to clean the skin. It can get rid of the bacteria that normally live on the skin and can keep them away for about 24 hours. To clean your skin with CHG, you may be given: A CHG solution to use in the shower or as part of a sponge bath. A prepackaged cloth that contains CHG. Cleaning your skin with CHG may help lower the risk for infection: While you are staying in the intensive care unit of the hospital. If you have a vascular access, such as a central line, to provide short-term or long-term access to your  veins. If you have a catheter to drain urine from your bladder. If you are on a ventilator. A ventilator is a machine that helps you breathe by moving air in and out of your lungs. After surgery. What are the risks? Risks of using CHG include: A skin reaction. Hearing loss, if CHG gets in your ears and you have a perforated eardrum. Eye injury, if CHG gets in your eyes and is not rinsed out. The CHG product catching fire. Make sure that you avoid smoking and flames after applying CHG to your skin. Do not use CHG: If you have a chlorhexidine allergy or have previously reacted to chlorhexidine. On babies younger than 36 months of age. How to use CHG solution Use CHG only as told by your health care provider, and follow the instructions on the label. Use the full amount of CHG as directed. Usually, this is one bottle. During a shower Follow these steps when using CHG solution during a shower (unless your health care provider gives you different instructions): Start the shower. Use your normal soap and shampoo to wash your face and hair. Turn off the shower or move out of the shower stream. Pour the CHG onto a clean washcloth. Do not use any type of brush or rough-edged sponge. Starting at your neck, lather your body down to  your toes. Make sure you follow these instructions: If you will be having surgery, pay special attention to the part of your body where you will be having surgery. Scrub this area for at least 1 minute. Do not use CHG on your head or face. If the solution gets into your ears or eyes, rinse them well with water. Avoid your genital area. Avoid any areas of skin that have broken skin, cuts, or scrapes. Scrub your back and under your arms. Make sure to wash skin folds. Let the lather sit on your skin for 1-2 minutes or as long as told by your health care provider. Thoroughly rinse your entire body in the shower. Make sure that all body creases and crevices are rinsed  well. Dry off with a clean towel. Do not put any substances on your body afterward--such as powder, lotion, or perfume--unless you are told to do so by your health care provider. Only use lotions that are recommended by the manufacturer. Put on clean clothes or pajamas. If it is the night before your surgery, sleep in clean sheets.  During a sponge bath Follow these steps when using CHG solution during a sponge bath (unless your health care provider gives you different instructions): Use your normal soap and shampoo to wash your face and hair. Pour the CHG onto a clean washcloth. Starting at your neck, lather your body down to your toes. Make sure you follow these instructions: If you will be having surgery, pay special attention to the part of your body where you will be having surgery. Scrub this area for at least 1 minute. Do not use CHG on your head or face. If the solution gets into your ears or eyes, rinse them well with water. Avoid your genital area. Avoid any areas of skin that have broken skin, cuts, or scrapes. Scrub your back and under your arms. Make sure to wash skin folds. Let the lather sit on your skin for 1-2 minutes or as long as told by your health care provider. Using a different clean, wet washcloth, thoroughly rinse your entire body. Make sure that all body creases and crevices are rinsed well. Dry off with a clean towel. Do not put any substances on your body afterward--such as powder, lotion, or perfume--unless you are told to do so by your health care provider. Only use lotions that are recommended by the manufacturer. Put on clean clothes or pajamas. If it is the night before your surgery, sleep in clean sheets. How to use CHG prepackaged cloths Only use CHG cloths as told by your health care provider, and follow the instructions on the label. Use the CHG cloth on clean, dry skin. Do not use the CHG cloth on your head or face unless your health care provider tells  you to. When washing with the CHG cloth: Avoid your genital area. Avoid any areas of skin that have broken skin, cuts, or scrapes. Before surgery Follow these steps when using a CHG cloth to clean before surgery (unless your health care provider gives you different instructions): Using the CHG cloth, vigorously scrub the part of your body where you will be having surgery. Scrub using a back-and-forth motion for 3 minutes. The area on your body should be completely wet with CHG when you are done scrubbing. Do not rinse. Discard the cloth and let the area air-dry. Do not put any substances on the area afterward, such as powder, lotion, or perfume. Put on clean clothes or pajamas. If  it is the night before your surgery, sleep in clean sheets.  For general bathing Follow these steps when using CHG cloths for general bathing (unless your health care provider gives you different instructions). Use a separate CHG cloth for each area of your body. Make sure you wash between any folds of skin and between your fingers and toes. Wash your body in the following order, switching to a new cloth after each step: The front of your neck, shoulders, and chest. Both of your arms, under your arms, and your hands. Your stomach and groin area, avoiding the genitals. Your right leg and foot. Your left leg and foot. The back of your neck, your back, and your buttocks. Do not rinse. Discard the cloth and let the area air-dry. Do not put any substances on your body afterward--such as powder, lotion, or perfume--unless you are told to do so by your health care provider. Only use lotions that are recommended by the manufacturer. Put on clean clothes or pajamas. Contact a health care provider if: Your skin gets irritated after scrubbing. You have questions about using your solution or cloth. You swallow any chlorhexidine. Call your local poison control center (1-973-869-2093 in the [place] Get help right away if: Your  eyes itch badly, or they become very red or swollen. Your skin itches badly and is red or swollen. Your hearing changes. You have trouble seeing. You have swelling or tingling in your mouth or throat. You have trouble breathing. These symptoms may represent a serious problem that is an emergency. Do not wait to see if the symptoms will go away. Get medical help right away. Call your local emergency services (911 in the [place] Do not drive yourself to the hospital. Summary Chlorhexidine gluconate (CHG) is a germ-killing (antiseptic) solution that is used to clean the skin. Cleaning your skin with CHG may help to lower your risk for infection. You may be given CHG to use for bathing. It may be in a bottle or in a prepackaged cloth to use on your skin. Carefully follow your health care provider's instructions and the instructions on the product label. Do not use CHG if you have a chlorhexidine allergy. Contact your health care provider if your skin gets irritated after scrubbing. This information is not intended to replace advice given to you by your health care provider. Make sure you discuss any questions you have with your health care provider. Document Revised: 03/21/2022 Document Reviewed: 02/01/2021 Elsevier Patient Education  [place] Sutures are stitches that can be used to close wounds. Sutures come in different materials. They may break down as your wound heals (absorbable), or they may need to be removed (nonabsorbable). Taking care of your wound properly can help to prevent pain and infection. It can also help your wound heal more quickly. Follow instructions from your health care provider about how to care for your sutured wound. Supplies needed: Soap and water. A clean, dry towel. Wound cleanser or saline, if needed. A clean gauze or bandage (dressing), if needed. Antibiotic ointment, if told by your health care provider. How to care for your sutured  wound  Keep the wound completely dry for the first 24 hours, or for as long as told by your health care provider. After 24-48 hours, you may shower or bathe as told by your health care provider. Do not soak or submerge the wound in water until the sutures have been removed. After the first 24 hours, clean  the wound once a day, or as often as told by your health care provider. Use the following steps: Wash and rinse the wound as told by your health care provider. Pat the wound dry with a clean towel. Do not rub the wound. After cleaning the wound, apply a thin layer of antibiotic ointment as told by your health care provider. This will prevent infection and keep the dressing from sticking to the wound. Follow instructions from your health care provider about how to change your dressing. Make sure you: Wash your hands with soap and water for at least 20 seconds before and after you change your dressing. If soap and water are not available, use hand sanitizer. Change your dressing at least once a day, or as often as told by your health care provider. If your dressing gets wet or dirty, change it. Leave sutures and other skin closures, such as adhesive strips or skin glue, in place. These skin closures may need to stay in place for 2 weeks or longer. If adhesive strip edges start to loosen and curl up, you may trim the loose edges. Do not remove adhesive strips completely unless your health care provider tells you to do that. Check your wound every day for signs of infection. Watch for: Redness, swelling, or pain. Fluid or blood. New warmth, a rash, or hardness at the wound site. Pus or a bad smell. Have the sutures removed as told by your health care provider. Follow these instructions at home: Medicines Take or apply over-the-counter and prescription medicines only as told by your health care provider. If you were prescribed an antibiotic medicine or ointment, take or apply it as told by your  health care provider. Do not stop using the antibiotic even if your condition improves. General instructions To help reduce scarring after your wound heals, cover your wound with clothing or apply sunscreen of at least 30 SPF whenever you are outside. Do not scratch or pick at your wound. Avoid stretching your wound. Raise (elevate) the injured area above the level of your heart while you are sitting or lying down, if possible. Eat a diet that includes protein, vitamin A, and vitamin C to help the wound heal. Drink enough fluid to keep your urine pale yellow. Keep all follow-up visits. This is important. Contact a health care provider if: You received a tetanus shot and you have swelling, severe pain, redness, or bleeding at the injection site. Your wound breaks open or you notice something coming out if it, such as wood or glass. You have any of these signs of infection: Redness, swelling, or pain around your wound. Fluid or blood coming from your wound. New warmth, a rash, or hardness around the wound. A fever. The skin near your wound changes color. You have pain that does not get better with medicine. You develop numbness around the wound. Get help right away if: You develop severe swelling or more pain around your wound. You have pus or a bad smell coming from your wound. You develop painful lumps near your wound or anywhere on your body. You have a red streak spreading out from your wound. The wound is on your hand or foot and: Your fingers or toes look pale or bluish. You cannot properly move a finger or toe. You have numbness that is spreading down your hand, foot, fingers, or toes. Summary Sutures are stitches that can be used to close wounds. Taking care of your wound properly can help to prevent  pain and infection. Keep the wound completely dry for the first 24 hours, or for as long as told by your health care provider. After 24-48 hours, you may shower or bathe as told by  your health care provider. To help with healing, eat foods that are rich in protein, vitamin A, and vitamin C. This information is not intended to replace advice given to you by your health care provider. Make sure you discuss any questions you have with your health care provider. Document Revised: 03/29/2021 Document Reviewed: 03/29/2021 Elsevier Patient Education  [place] Anesthesia, Adult, Care After The following information offers guidance on how to care for yourself after your procedure. Your health care provider may also give you more specific instructions. If you have problems or questions, contact your health care provider. What can I expect after the procedure? After the procedure, it is common for people to: Have pain or discomfort at the IV site. Have nausea or vomiting. Have a sore throat or hoarseness. Have trouble concentrating. Feel cold or chills. Feel weak, sleepy, or tired (fatigue). Have soreness and body aches. These can affect parts of the body that were not involved in surgery. Follow these instructions at home: For the time period you were told by your health care provider:  Rest. Do not participate in activities where you could fall or become injured. Do not drive or use machinery. Do not drink alcohol. Do not take sleeping pills or medicines that cause drowsiness. Do not make important decisions or sign legal documents. Do not take care of children on your own. General instructions Drink enough fluid to keep your urine pale yellow. If you have sleep apnea, surgery and certain medicines can increase your risk for breathing problems. Follow instructions from your health care provider about wearing your sleep device: Anytime you are sleeping, including during daytime naps. While taking prescription pain medicines, sleeping medicines, or medicines that make you drowsy. Return to your normal activities as told by your health care provider. Ask  your health care provider what activities are safe for you. Take over-the-counter and prescription medicines only as told by your health care provider. Do not use any products that contain nicotine or tobacco. These products include cigarettes, chewing tobacco, and vaping devices, such as e-cigarettes. These can delay incision healing after surgery. If you need help quitting, ask your health care provider. Contact a health care provider if: You have nausea or vomiting that does not get better with medicine. You vomit every time you eat or drink. You have pain that does not get better with medicine. You cannot urinate or have bloody urine. You develop a skin rash. You have a fever. Get help right away if: You have trouble breathing. You have chest pain. You vomit blood. These symptoms may be an emergency. Get help right away. Call 911. Do not wait to see if the symptoms will go away. Do not drive yourself to the hospital. Summary After the procedure, it is common to have a sore throat, hoarseness, nausea, vomiting, or to feel weak, sleepy, or fatigue. For the time period you were told by your health care provider, do not drive or use machinery. Get help right away if you have difficulty breathing, have chest pain, or vomit blood. These symptoms may be an emergency. This information is not intended to replace advice given to you by your health care provider. Make sure you discuss any questions you have with your health care provider. Document Revised: 02/18/2022  Document Reviewed: 02/18/2022 Elsevier Patient Education  [place].

## 2022-09-08 ENCOUNTER — Encounter (HOSPITAL_COMMUNITY)
Admission: RE | Admit: 2022-09-08 | Discharge: 2022-09-08 | Disposition: A | Discharge status: Home / Self Care | Payer: BC Managed Care – PPO | Source: Ambulatory Visit | Attending: General Surgery | Admitting: General Surgery

## 2022-09-08 ENCOUNTER — Encounter (HOSPITAL_COMMUNITY): Payer: Self-pay

## 2022-09-08 VITALS — HR 77 | Temp 98.3°F | Resp 16 | Ht 64.5 in | Wt 219.0 lb

## 2022-09-08 DIAGNOSIS — Z01818 Encounter for other preprocedural examination: Secondary | ICD-10-CM | POA: Insufficient documentation

## 2022-09-08 DIAGNOSIS — R9431 Abnormal electrocardiogram [ECG] [EKG]: Secondary | ICD-10-CM | POA: Diagnosis not present

## 2022-09-08 DIAGNOSIS — E119 Type 2 diabetes mellitus without complications: Secondary | ICD-10-CM | POA: Insufficient documentation

## 2022-09-08 DIAGNOSIS — G43701 Chronic migraine without aura, not intractable, with status migrainosus: Secondary | ICD-10-CM | POA: Diagnosis not present

## 2022-09-08 DIAGNOSIS — G609 Hereditary and idiopathic neuropathy, unspecified: Secondary | ICD-10-CM | POA: Diagnosis not present

## 2022-09-08 DIAGNOSIS — Z794 Long term (current) use of insulin: Secondary | ICD-10-CM | POA: Insufficient documentation

## 2022-09-08 DIAGNOSIS — Z79899 Other long term (current) drug therapy: Secondary | ICD-10-CM | POA: Diagnosis not present

## 2022-09-08 DIAGNOSIS — I10 Essential (primary) hypertension: Secondary | ICD-10-CM | POA: Diagnosis not present

## 2022-09-08 DIAGNOSIS — M542 Cervicalgia: Secondary | ICD-10-CM | POA: Diagnosis not present

## 2022-09-08 DIAGNOSIS — Z79891 Long term (current) use of opiate analgesic: Secondary | ICD-10-CM | POA: Diagnosis not present

## 2022-09-08 HISTORY — DX: Vertigo of central origin: H81.4

## 2022-09-08 HISTORY — DX: Hypothyroidism, unspecified: E03.9

## 2022-09-08 HISTORY — DX: Anxiety disorder, unspecified: F41.9

## 2022-09-08 HISTORY — DX: Depression, unspecified: F32.A

## 2022-09-08 LAB — HEMOGLOBIN A1C
Hgb A1c MFr Bld: 9.7 % — ABNORMAL HIGH (ref 4.8–5.6)
Mean Plasma Glucose: 231.69 mg/dL

## 2022-09-08 LAB — BASIC METABOLIC PANEL
Anion gap: 11 (ref 5–15)
BUN: 13 mg/dL (ref 6–20)
CO2: 23 mmol/L (ref 22–32)
Calcium: 9.4 mg/dL (ref 8.9–10.3)
Chloride: 102 mmol/L (ref 98–111)
Creatinine, Ser: 0.81 mg/dL (ref 0.44–1.00)
GFR, Estimated: >60 mL/min (ref >60)
Glucose, Bld: 220 mg/dL — ABNORMAL HIGH (ref 70–99)
Potassium: 4.3 mmol/L (ref 3.5–5.1)
Sodium: 136 mmol/L (ref 135–145)

## 2022-09-08 LAB — POCT PREGNANCY, URINE: Preg Test, Ur: NEGATIVE

## 2022-09-08 NOTE — H&P (Signed)
[facility] History and Physical  Reason for Referral: Lipoma left thigh, cyst right jaw line  Referring Physician: Redmond School, MD   Chief Complaint   New Patient (Initial Visit)     [name] is a 51 y.o. female.  HPI: [name] is a sweet 51 yo who comes in with a cyst on her jawline that has been removed by dermatology in their office multiple times but continues to return. She had a CT of this area that agrees with it being a likely cyst but also in the setting of her papillary thyroid cancer s/p thyroidectomy they recommended excision. She has a left thigh lipoma that has been there for years and was biopsied. She says it has grown from the size of a quarter to a golf ball in that time. She has some shooting/ pins and needle type pain in this area.   Past Medical History:  Diagnosis Date   BV (bacterial vaginosis)    BV   Cancer ([place]) 2009   papillary thyroid cancer   Diabetes mellitus without complication (HCC)    Family history of breast cancer    Hyperlipidemia    Hypertension    IBS (irritable bowel syndrome)    Thyroid disease     Past Surgical History:  Procedure Laterality Date   ABLATION     CARPAL TUNNEL RELEASE     CESAREAN SECTION     NECK AND CHEST LESION     THYROIDECTOMY     Right and Left, 05/2008, 09/2008    Family History  Problem Relation Age of Onset   Breast cancer Mother        died at age 37   Mental illness Mother    Hypertension Father    Hyperlipidemia Father    Diabetes Father    Breast cancer Maternal Grandmother        died in her late 70s   Lung cancer Maternal Uncle     Social History   Tobacco Use   Smoking status: Former   Smokeless tobacco: Never  Scientific laboratory technician Use: Never used  Substance Use Topics   Alcohol use: Yes    Comment: occ   Drug use: Never    Medications: I have reviewed the patient's current medications. Allergies as of 09/06/2022       Reactions   Aspirin    Up set  stomach Other reaction(s): stomach upset   Elemental Sulfur Hives   Sulfur Dioxide Hives   Sulfamethoxazole-trimethoprim Rash        Medication List        Accurate as of September 06, 2022 10:43 AM. If you have any questions, ask your nurse or doctor.          ALPRAZolam 0.5 MG tablet Commonly known as: XANAX Take 0.5 mg by mouth 2 (two) times daily as needed.   atorvastatin 20 MG tablet Commonly known as: LIPITOR Take 20 mg by mouth daily.   B-D UF III MINI PEN NEEDLES 31G X 5 MM Misc Generic drug: Insulin Pen Needle USE AND DISCARD 1 PEN      NEEDLE IN THE MORNING, AT   NOON, IN THE EVENING, AND  AT BEDTIME   B-D UF III MINI PEN NEEDLES 31G X 5 MM Misc Generic drug: Insulin Pen Needle USE AND DISCARD 1 PEN      NEEDLE FOR SUBCUTANEOUS    INJECTION 3 TIMES A DAY   blood glucose meter kit  and supplies Kit Dispense based on patient and insurance preference. Use up to four times daily as directed.   butalbital-acetaminophen-caffeine 50-325-40 MG tablet Commonly known as: FIORICET Take 1 tablet by mouth 2 (two) times daily as needed.   Dexcom G6 Sensor Misc CHANGE SENSOR EVERY 10 DAYSAS DIRECTED   Dexcom G6 Transmitter Misc CHANGE TRANSMITTER EVERY 90DAYS AS DIRECTED   diazepam 2 MG tablet Commonly known as: VALIUM Take 2 mg by mouth 2 (two) times daily as needed.   DULoxetine 30 MG capsule Commonly known as: CYMBALTA Take 30 mg by mouth 2 (two) times daily.   Emgality 120 MG/ML Soaj Generic drug: Galcanezumab-gnlm Inject 1 mL every month by subcutaneous route.   fenofibrate micronized 200 MG capsule Commonly known as: LOFIBRA Take 200 mg by mouth daily before breakfast.   fluconazole 100 MG tablet Commonly known as: DIFLUCAN Take 100 mg by mouth daily.   FreeStyle Libre 14 Day Reader Kerrin Mo Use to check blood glucose 4 times daily.   gabapentin 800 MG tablet Commonly known as: NEURONTIN Take 800 mg by mouth 4 (four) times daily.   glimepiride 4 MG  tablet Commonly known as: AMARYL Take 1 tablet (4 mg total) by mouth daily.   HYDROcodone-acetaminophen 7.5-325 MG tablet Commonly known as: NORCO Take 1 tablet by mouth every 8 (eight) hours as needed.   hydrOXYzine 25 MG tablet Commonly known as: ATARAX Take 25 mg by mouth every 4 (four) hours as needed.   lisinopril 2.5 MG tablet Commonly known as: ZESTRIL Take 2.5 mg by mouth daily.   meclizine 25 MG tablet Commonly known as: ANTIVERT Take 25 mg by mouth 4 (four) times daily as needed.   NovoLOG FlexPen 100 UNIT/ML FlexPen Generic drug: insulin aspart Inject 12-18 Units into the skin 3 (three) times daily with meals.   Omega 3 1000 MG Caps 1 capsule   OneTouch Delica Plus [id number] Misc Apply 1 each topically 4 (four) times daily.   OneTouch Verio test strip Generic drug: glucose blood 1 each 4 (four) times daily.   scopolamine 1 MG/3DAYS Commonly known as: TRANSDERM-SCOP scopolamine 1 mg over 3 days transdermal patch  Apply 1 patch by transdermal route as directed.  Replace 1 every 3 days   Synthroid 175 MCG tablet Generic drug: levothyroxine TAKE 1 TABLET DAILY   Toujeo Max SoloStar 300 UNIT/ML Solostar Pen Generic drug: insulin glargine (2 Unit Dial) Inject 70 Units into the skin at bedtime. INJECT 60 UNITS            SUBCUTANEOUSLY AT BEDTIME What changed:  how much to take additional instructions   traMADol 50 MG tablet Commonly known as: ULTRAM Take 100 mg by mouth 3 (three) times daily as needed.   Vitamin D (Ergocalciferol) 1.25 MG (50000 UNIT) Caps capsule Commonly known as: DRISDOL Take 50,000 Units by mouth once a week.         ROS:  A comprehensive review of systems was negative except for: Gastrointestinal: positive for nausea Musculoskeletal: positive for back pain, neck pain, and joint pain Neurological: positive for central vestibular dysfunction- dizziness numbness Endocrine: positive for thirst, diabetes  Blood pressure  135/78, pulse 69, temperature 98.3 F (36.8 C), temperature source Oral, resp. rate 14, height 5' 4.5" (1.638 m), weight 219 lb (99.3 kg), SpO2 97 %. Physical Exam Vitals reviewed.  Constitutional:      Appearance: Normal appearance.  HENT:     Head: Normocephalic.     Mouth/Throat:     Mouth:  Mucous membranes are moist.  Eyes:     Extraocular Movements: Extraocular movements intact.  Neck:     Comments: Right jawline with palpable, superficial, 1.5 cm mass, scar overlying from prior excisions, Thyroidectomy scar Cardiovascular:     Rate and Rhythm: Normal rate and regular rhythm.  Pulmonary:     Effort: Pulmonary effort is normal.  Abdominal:     General: There is no distension.     Palpations: Abdomen is soft.     Tenderness: There is no abdominal tenderness.  Musculoskeletal:        General: Normal range of motion.     Cervical back: Normal range of motion.     Comments: Left thigh lateral, 3cm palpable mass with some minor tenderness   Skin:    General: Skin is warm.  Neurological:     General: No focal deficit present.     Mental Status: She is alert and oriented to person, place, and time.  Psychiatric:        Mood and Affect: Mood normal.        Behavior: Behavior normal.        Thought Content: Thought content normal.        Judgment: Judgment normal.     Results: personally reviewed and showed the patient- 1.5cm cyst like mass  CLINICAL DATA:  History of papillary thyroid cancer. Nodule seen on recent ultrasound.   EXAM: CT NECK WITH CONTRAST   TECHNIQUE: Multidetector CT imaging of the neck was performed using the standard protocol following the bolus administration of intravenous contrast.   RADIATION DOSE REDUCTION: This exam was performed according to the departmental dose-optimization program which includes automated exposure control, adjustment of the mA and/or kV according to patient size and/or use of iterative reconstruction technique.    CONTRAST:  71mL OMNIPAQUE IOHEXOL 300 MG/ML  SOLN   COMPARISON:  Thyroid ultrasound 05/20/2022   FINDINGS: Pharynx and larynx: The nasal cavity and nasopharynx are unremarkable.   The oral cavity and oropharynx are unremarkable. The parapharyngeal spaces are clear.   The hypopharynx and larynx are unremarkable. The vocal folds are normal in appearance.   Salivary glands: The parotid and submandibular glands are unremarkable.   Thyroid: The patient is status post thyroidectomy. There is no abnormal soft tissue mass in the thyroidectomy bed.   Lymph nodes: There is a 1.5 cm x 1.4 cm centrally hypodense cystic lesion in the subcutaneous fat in the right submandibular lesion which could reflect a lymph node or cutaneous/subcutaneous cystic lesion.   There are scattered subcentimeter cervical chain lymph nodes bilaterally without other evidence of pathologic lymphadenopathy.   Vascular: The major vasculature of the neck is unremarkable.   Limited intracranial: The imaged portions of the intracranial compartment are unremarkable.   Visualized orbits: The imaged globes and orbits are unremarkable.   Mastoids and visualized paranasal sinuses: Clear.   Skeleton: There is no acute osseous abnormality or suspicious osseous lesion.   Upper chest: The imaged lung apices are clear.   Other: None.   IMPRESSION: 1. 1.5 cm size centrally hypodense cystic lesion in the subcutaneous fat in the right submandibular lesion corresponding to the lesion seen on prior ultrasound. This lesion may reflect a benign cutaneous/subcutaneous cyst; however, given the history of thyroid cancer, recommend tissue sampling to exclude necrotic metastatic nodal disease. 2. No abnormal soft tissue mass in the thyroidectomy bed or other evidence of pathologic lymphadenopathy.     Electronically Signed   By: Valetta Mole  M.D.   On: 07/08/2022 12:24  Assessment & Plan:  FOY MUNGIA is a 51 y.o.  female with a cyst on the right jaw line and a left thigh lipoma. We will plan to excise both in the OR. Discussed risk of bleeding, infection, seroma, finding out something other than the benign pathology we expect, recurrence.   All questions were answered to the satisfaction of the patient.   Virl Cagey 09/06/2022, 10:43 AM

## 2022-09-12 ENCOUNTER — Encounter (HOSPITAL_COMMUNITY): Payer: Self-pay | Admitting: General Surgery

## 2022-09-12 ENCOUNTER — Other Ambulatory Visit: Payer: Self-pay

## 2022-09-12 ENCOUNTER — Ambulatory Visit (HOSPITAL_COMMUNITY)
Admission: RE | Admit: 2022-09-12 | Discharge: 2022-09-12 | Disposition: A | Discharge status: Home / Self Care | Payer: BC Managed Care – PPO | Attending: General Surgery | Admitting: General Surgery

## 2022-09-12 ENCOUNTER — Ambulatory Visit (HOSPITAL_COMMUNITY): Payer: BC Managed Care – PPO | Admitting: Anesthesiology

## 2022-09-12 ENCOUNTER — Encounter (HOSPITAL_COMMUNITY)
Admission: RE | Disposition: A | Discharge status: Home / Self Care | Payer: Self-pay | Source: Home / Self Care | Attending: General Surgery

## 2022-09-12 DIAGNOSIS — E119 Type 2 diabetes mellitus without complications: Secondary | ICD-10-CM | POA: Diagnosis not present

## 2022-09-12 DIAGNOSIS — L728 Other follicular cysts of the skin and subcutaneous tissue: Secondary | ICD-10-CM | POA: Insufficient documentation

## 2022-09-12 DIAGNOSIS — R221 Localized swelling, mass and lump, neck: Secondary | ICD-10-CM

## 2022-09-12 DIAGNOSIS — E1165 Type 2 diabetes mellitus with hyperglycemia: Secondary | ICD-10-CM | POA: Diagnosis not present

## 2022-09-12 DIAGNOSIS — Z794 Long term (current) use of insulin: Secondary | ICD-10-CM | POA: Diagnosis not present

## 2022-09-12 DIAGNOSIS — L928 Other granulomatous disorders of the skin and subcutaneous tissue: Secondary | ICD-10-CM | POA: Diagnosis not present

## 2022-09-12 DIAGNOSIS — L720 Epidermal cyst: Secondary | ICD-10-CM | POA: Diagnosis not present

## 2022-09-12 DIAGNOSIS — Z87891 Personal history of nicotine dependence: Secondary | ICD-10-CM | POA: Insufficient documentation

## 2022-09-12 DIAGNOSIS — L729 Follicular cyst of the skin and subcutaneous tissue, unspecified: Secondary | ICD-10-CM | POA: Diagnosis not present

## 2022-09-12 DIAGNOSIS — Z79899 Other long term (current) drug therapy: Secondary | ICD-10-CM | POA: Insufficient documentation

## 2022-09-12 DIAGNOSIS — D1724 Benign lipomatous neoplasm of skin and subcutaneous tissue of left leg: Secondary | ICD-10-CM | POA: Diagnosis not present

## 2022-09-12 DIAGNOSIS — E039 Hypothyroidism, unspecified: Secondary | ICD-10-CM | POA: Insufficient documentation

## 2022-09-12 DIAGNOSIS — I10 Essential (primary) hypertension: Secondary | ICD-10-CM | POA: Diagnosis not present

## 2022-09-12 DIAGNOSIS — Z7984 Long term (current) use of oral hypoglycemic drugs: Secondary | ICD-10-CM | POA: Diagnosis not present

## 2022-09-12 HISTORY — PX: LIPOMA EXCISION: SHX5283

## 2022-09-12 HISTORY — PX: CYST EXCISION: SHX5701

## 2022-09-12 LAB — GLUCOSE, CAPILLARY
Glucose-Capillary: 237 mg/dL — ABNORMAL HIGH (ref 70–99)
Glucose-Capillary: 237 mg/dL — ABNORMAL HIGH (ref 70–99)

## 2022-09-12 SURGERY — CYST REMOVAL
Anesthesia: General | Site: Thigh

## 2022-09-12 MED ORDER — LACTATED RINGERS IV SOLN: INTRAVENOUS | Status: DC

## 2022-09-12 MED ORDER — LIDOCAINE HCL (CARDIAC) PF 100 MG/5ML IV SOSY
PREFILLED_SYRINGE | INTRAVENOUS | Status: DC | PRN
  Administered 2022-09-12: 100 mg via INTRAVENOUS

## 2022-09-12 MED ORDER — CEFAZOLIN SODIUM-DEXTROSE 2-4 GM/100ML-% IV SOLN
2.0000 g | INTRAVENOUS | Status: AC
  Administered 2022-09-12: 2 g via INTRAVENOUS
  Filled 2022-09-12: qty 100

## 2022-09-12 MED ORDER — CHLORHEXIDINE GLUCONATE CLOTH 2 % EX PADS: 6.0000 | MEDICATED_PAD | Freq: Once | CUTANEOUS | Status: DC

## 2022-09-12 MED ORDER — MIDAZOLAM HCL 5 MG/5ML IJ SOLN
INTRAMUSCULAR | Status: DC | PRN
  Administered 2022-09-12: 2 mg via INTRAVENOUS

## 2022-09-12 MED ORDER — PROPOFOL 10 MG/ML IV BOLUS
INTRAVENOUS | Status: AC
  Filled 2022-09-12: qty 20

## 2022-09-12 MED ORDER — MIDAZOLAM HCL 2 MG/2ML IJ SOLN
INTRAMUSCULAR | Status: AC
  Filled 2022-09-12: qty 2

## 2022-09-12 MED ORDER — METOCLOPRAMIDE HCL 5 MG/ML IJ SOLN
10.0000 mg | Freq: Once | INTRAMUSCULAR | Status: AC
  Administered 2022-09-12: 10 mg via INTRAVENOUS

## 2022-09-12 MED ORDER — ROCURONIUM BROMIDE 100 MG/10ML IV SOLN
INTRAVENOUS | Status: DC | PRN
  Administered 2022-09-12: 60 mg via INTRAVENOUS

## 2022-09-12 MED ORDER — SUGAMMADEX SODIUM 200 MG/2ML IV SOLN
INTRAVENOUS | Status: DC | PRN
  Administered 2022-09-12: 100 mg via INTRAVENOUS

## 2022-09-12 MED ORDER — DEXAMETHASONE SODIUM PHOSPHATE 4 MG/ML IJ SOLN
INTRAMUSCULAR | Status: AC
  Filled 2022-09-12: qty 1

## 2022-09-12 MED ORDER — BUPIVACAINE HCL (PF) 0.5 % IJ SOLN
INTRAMUSCULAR | Status: DC | PRN
  Administered 2022-09-12: 10 mL
  Administered 2022-09-12: 20 mL

## 2022-09-12 MED ORDER — ONDANSETRON HCL 4 MG PO TABS: 4.0000 mg | ORAL_TABLET | Freq: Three times a day (TID) | ORAL | 1 refills | Status: AC | PRN

## 2022-09-12 MED ORDER — ONDANSETRON HCL 4 MG/2ML IJ SOLN
INTRAMUSCULAR | Status: DC | PRN
  Administered 2022-09-12: 4 mg via INTRAVENOUS

## 2022-09-12 MED ORDER — BUPIVACAINE HCL (PF) 0.5 % IJ SOLN
INTRAMUSCULAR | Status: AC
  Filled 2022-09-12: qty 30

## 2022-09-12 MED ORDER — MEPERIDINE HCL 50 MG/ML IJ SOLN: 6.2500 mg | INTRAMUSCULAR | Status: DC | PRN

## 2022-09-12 MED ORDER — FENTANYL CITRATE (PF) 100 MCG/2ML IJ SOLN
INTRAMUSCULAR | Status: AC
  Filled 2022-09-12: qty 2

## 2022-09-12 MED ORDER — DEXAMETHASONE SODIUM PHOSPHATE 4 MG/ML IJ SOLN
INTRAMUSCULAR | Status: DC | PRN
  Administered 2022-09-12: 4 mg via INTRAVENOUS

## 2022-09-12 MED ORDER — ORAL CARE MOUTH RINSE: 15.0000 mL | Freq: Once | OROMUCOSAL | Status: DC

## 2022-09-12 MED ORDER — FENTANYL CITRATE (PF) 100 MCG/2ML IJ SOLN
INTRAMUSCULAR | Status: DC | PRN
  Administered 2022-09-12: 100 ug via INTRAVENOUS

## 2022-09-12 MED ORDER — SODIUM CHLORIDE 0.9 % IR SOLN
Status: DC | PRN
  Administered 2022-09-12: 1000 mL

## 2022-09-12 MED ORDER — ONDANSETRON HCL 4 MG/2ML IJ SOLN: 4.0000 mg | Freq: Once | INTRAMUSCULAR | Status: DC | PRN

## 2022-09-12 MED ORDER — HYDROMORPHONE HCL 1 MG/ML IJ SOLN: 0.2500 mg | INTRAMUSCULAR | Status: DC | PRN

## 2022-09-12 MED ORDER — OXYCODONE HCL 5 MG PO TABS: 5.0000 mg | ORAL_TABLET | ORAL | 0 refills | Status: AC | PRN

## 2022-09-12 MED ORDER — METOCLOPRAMIDE HCL 5 MG/ML IJ SOLN
INTRAMUSCULAR | Status: AC
  Filled 2022-09-12: qty 2

## 2022-09-12 MED ORDER — PROPOFOL 10 MG/ML IV BOLUS
INTRAVENOUS | Status: DC | PRN
  Administered 2022-09-12: 200 mg via INTRAVENOUS

## 2022-09-12 MED ORDER — CHLORHEXIDINE GLUCONATE 0.12 % MT SOLN: 15.0000 mL | Freq: Once | OROMUCOSAL | Status: DC

## 2022-09-12 MED ORDER — CHLORHEXIDINE GLUCONATE 0.12 % MT SOLN
OROMUCOSAL | Status: AC
  Administered 2022-09-12: 15 mL
  Filled 2022-09-12: qty 15

## 2022-09-12 SURGICAL SUPPLY — 36 items
ADH SKN CLS APL DERMABOND .7 (GAUZE/BANDAGES/DRESSINGS) ×4
APL PRP STRL LF DISP 70% ISPRP (MISCELLANEOUS) ×2
APL PRP STRL LF ISPRP CHG 10.5 (MISCELLANEOUS) ×2
APPLICATOR CHLORAPREP 10.5 ORG (MISCELLANEOUS) IMPLANT
BLADE SURG 15 STRL LF DISP TIS (BLADE) IMPLANT
BLADE SURG 15 STRL SS (BLADE) ×2
BNDG ELASTIC 6X5.8 VLCR STR LF (GAUZE/BANDAGES/DRESSINGS) IMPLANT
CHLORAPREP W/TINT 26 (MISCELLANEOUS) ×2 IMPLANT
CLOTH BEACON ORANGE TIMEOUT ST (SAFETY) ×2 IMPLANT
COVER LIGHT HANDLE STERIS (MISCELLANEOUS) ×4 IMPLANT
DECANTER SPIKE VIAL GLASS SM (MISCELLANEOUS) ×2 IMPLANT
DERMABOND ADVANCED .7 DNX12 (GAUZE/BANDAGES/DRESSINGS) IMPLANT
ELECT REM PT RETURN 9FT ADLT (ELECTROSURGICAL) ×2
ELECTRODE REM PT RTRN 9FT ADLT (ELECTROSURGICAL) ×2 IMPLANT
GAUZE SPONGE 4X4 12PLY STRL (GAUZE/BANDAGES/DRESSINGS) IMPLANT
GLOVE BIO SURGEON STRL SZ 6.5 (GLOVE) ×2 IMPLANT
GLOVE BIO SURGEON STRL SZ7 (GLOVE) IMPLANT
GLOVE BIOGEL PI IND STRL 6.5 (GLOVE) ×2 IMPLANT
GLOVE BIOGEL PI IND STRL 7.0 (GLOVE) ×4 IMPLANT
GOWN STRL REUS W/TWL LRG LVL3 (GOWN DISPOSABLE) ×4 IMPLANT
KIT TURNOVER KIT A (KITS) ×2 IMPLANT
MANIFOLD NEPTUNE II (INSTRUMENTS) ×2 IMPLANT
NDL HYPO 25X1 1.5 SAFETY (NEEDLE) ×2 IMPLANT
NEEDLE HYPO 25X1 1.5 SAFETY (NEEDLE) ×2 IMPLANT
NS IRRIG 1000ML POUR BTL (IV SOLUTION) ×2 IMPLANT
PACK MINOR (CUSTOM PROCEDURE TRAY) ×2 IMPLANT
PAD ABD 5X9 TENDERSORB (GAUZE/BANDAGES/DRESSINGS) IMPLANT
PAD ARMBOARD 7.5X6 YLW CONV (MISCELLANEOUS) ×2 IMPLANT
PAD TELFA 3X4 1S STER (GAUZE/BANDAGES/DRESSINGS) IMPLANT
SET BASIN LINEN APH (SET/KITS/TRAYS/PACK) ×2 IMPLANT
SPONGE T-LAP 18X18 ~~LOC~~+RFID (SPONGE) IMPLANT
SUT MNCRL AB 4-0 PS2 18 (SUTURE) IMPLANT
SUT VIC AB 3-0 SH 27 (SUTURE) ×8
SUT VIC AB 3-0 SH 27X BRD (SUTURE) ×2 IMPLANT
SYR BULB IRRIG 60ML STRL (SYRINGE) ×2 IMPLANT
SYR CONTROL 10ML LL (SYRINGE) ×2 IMPLANT

## 2022-09-12 NOTE — Anesthesia Postprocedure Evaluation (Signed)
Anesthesia Post Note  Patient: [name]  Procedure(s) Performed: CYST REMOVAL, NECK (Neck) EXCISION LIPOMA, THIGH (Left: Thigh)  Patient location during evaluation: Phase II Anesthesia Type: General Level of consciousness: awake and alert and oriented Pain management: pain level controlled Vital Signs Assessment: post-procedure vital signs reviewed and stable Respiratory status: spontaneous breathing, nonlabored ventilation and respiratory function stable Cardiovascular status: blood pressure returned to baseline and stable Postop Assessment: no apparent nausea or vomiting Anesthetic complications: no   No notable events documented.   Last Vitals:  Vitals:   09/12/22 1006 09/12/22 1007  BP:  (!) 142/73  Pulse: 81   Resp: 14   Temp: 36.4 C   SpO2: 95%     Last Pain:  Vitals:   09/12/22 1006  TempSrc: Oral  PainSc: 2                  Ash Mcelwain C Tarren Velardi

## 2022-09-12 NOTE — Discharge Instructions (Signed)
Discharge Instructions:  Common Complaints: Pain at the incision site is common.  Some nausea is common and poor appetite. The main goal is to stay hydrated the first few days after surgery.  Some bruising can happen. Follow the ACE instructions below to prevent seroma formation on the leg.   Diet/ Activity: Diet as tolerated. You may not have an appetite, but it is important to stay hydrated. Drink 64 ounces of water a day. Your appetite will return with time.  Shower per your regular routine daily.  Do not take hot showers. Take warm showers that are less than 10 minutes. Walk everyday for at least 15-20 minutes. Deep cough and move around every 1-2 hours in the first few days after surgery.  Limit excessive movement, lifting > 10 lbs, stretching with the limb if there is an incision on your arm/armpit or leg.   Limit stretching, pulling on your incision if it is located on other parts of your body.  Do not pick at the dermabond glue on your incision sites. This glue film will remain in place for 1-2 weeks and will start to peel off.  Keep a pressure dressing on your left leg for the first 48 hours and then you can remove and replace with an Ace daily for another week to hold pressure on the area.  Do not place lotions or balms on your incision unless instructed to specifically by Dr. Constance Haw.   Medication: Take tylenol and ibuprofen as needed for pain control, alternating every 4-6 hours.  Example:  Tylenol '1000mg'$  @ 6am, 12noon, 6pm, 20mdnight (Do not exceed '4000mg'$  of tylenol a day). Ibuprofen '800mg'$  @ 9am, 3pm, 9pm, 3am (Do not exceed '3600mg'$  of ibuprofen a day).  Take Roxicodone for breakthrough pain every 4 hours.  Take Colace for constipation related to narcotic pain medication. If you do not have a bowel movement in 2 days, take Miralax over the counter.  Drink plenty of water to also prevent constipation.   Contact Information: If you have questions or concerns, please call our  office, [phone] Monday- Thursday 8AM-5PM and Friday 8AM-12Noon.  If it is after hours or on the weekend, please call [facility] Main [facility], [phone] and ask to speak to the surgeon on call for Dr. [facility]

## 2022-09-12 NOTE — Interval H&P Note (Signed)
History and Physical Interval Note:  09/12/2022 7:48 AM  [name]  has presented today for surgery, with the diagnosis of SEBACEOUS CYST, NECK 1.5 CM LIPOMA, LEFT THIGH 5CM.  The various methods of treatment have been discussed with the patient and family. After consideration of risks, benefits and other options for treatment, the patient has consented to  Procedure(s): CYST REMOVAL, NECK (N/A) EXCISION LIPOMA, THIGH (Left) as a surgical intervention.  The patient's history has been reviewed, patient examined, no change in status, stable for surgery.  I have reviewed the patient's chart and labs.  Questions were answered to the patient's satisfaction.     Virl Cagey

## 2022-09-12 NOTE — Op Note (Signed)
[facility] Operative Note  09/12/22  Preoperative Diagnosis: Right neck cyst, Left thigh lipoma    Postoperative Diagnosis: Right neck cyst, Left thigh lipoma with necrotic fat from prior biopsy    Procedure(s) Performed: Excision of right neck cyst 1.5cm, Excision of left thigh lipoma 1.5cm   Surgeon: Ria Comment C. Constance Haw, MD   Assistants: No qualified resident was available    Anesthesia: General endotracheal   Anesthesiologist: Denese Killings, MD    Specimens: Cyst; necrotic fat from lipoma portion    Estimated Blood Loss: Minimal   Blood Replacement: None    Complications: None   Wound Class: Clean contaminated neck cyst (some keratin spilled); clean lipoma    Operative Indications: [name] is a 51 yo who has had multiple cyst excised from the same place on her neck and has had a left thigh area biopsied coming back as a lipoma. We discussed formal excision of both and risk of bleeding, infection, seroma, recurrence, and finding something unexpected.   Findings: Right neck cyst intact, minimal spillage of keratin; left thigh lipoma with some necrotic fat (portion sent for pathology); no large lipoma noted, checked subfascial and in muscle and noted no additional lipoma    Procedure: The patient was taken to the operating room and placed supine. General endotracheal anesthesia was induced. Intravenous antibiotics were administered per protocol.  The right neck and left thigh were prepared and draped in the usual sterile fashion.   An elliptical incision was made over the prior scar from the cyst excision site and carried down to the subcutaneous tissue. Using sharp and blunt dissection with scissors and cautery, I removed the 1.5cm cyst in its entirety. There was some minor spillage of keratin. The cyst was down to the muscle fibers. Irrigation was performed. Hemostasis was confirmed. The deep space was closed with 3-0 Vicryl and the skin was closed with  4-0 Monocryl. Marcaine was injected. Dermabond closed the skin.   Attention was moved to the thigh. A linear incision over the lump was made at the site of her prior biopsy and my mark. Flaps were created. There was an area of necrotic appearing grayish/ brown fat that was partially suctioned but and partially sent to pathology that was above the fascia. This was no more than 1.5cm in size. This is likely the lipoma they biopsied and caused a hematoma and then necrosis (patient removed it doubly in size after the biopsy). I made a cut in the fascia under this area and invested and found no intramuscular or subfascial lipomas.  The fascia was closed with 3-0 Vicryl interrupted. The deep space was closed with 3-0 Vicryl interrupted. Hemostasis was confirmed. Marcaine was injected in each layer. The flaps were closed with 3-0 Vicryl interrupted and the skin was closed with 4-0 Monocryl and dermabond. A pressure dressing was placed on the leg to prevent seroma formation.   Final inspection revealed acceptable hemostasis. All counts were correct at the end of the case. The patient was awakened from anesthesia and extubated without complication.  The patient went to the PACU in stable condition.   Curlene Labrum, MD [facility] [address] [place], [place] [address] [phone] (office)

## 2022-09-12 NOTE — Transfer of Care (Addendum)
Immediate Anesthesia Transfer of Care Note  Patient: [name]  Procedure(s) Performed: CYST REMOVAL, NECK (Neck) EXCISION LIPOMA, THIGH (Left: Thigh)  Patient Location: PACU  Anesthesia Type:General  Level of Consciousness: awake  Airway & Oxygen Therapy: Patient Spontanous Breathing  Post-op Assessment: Report given to RN  Post vital signs: Reviewed and stable  Last Vitals:  Vitals Value Taken Time  BP 156/76 09/12/22 0930  Temp 36.4   Pulse 83 09/12/22 0936  Resp 13 09/12/22 0936  SpO2 94 % 09/12/22 0936  Vitals shown include unvalidated device data.  Last Pain:  Vitals:   09/12/22 0647  PainSc: 0-No pain         Complications: No notable events documented.

## 2022-09-12 NOTE — Anesthesia Procedure Notes (Signed)
Procedure Name: Intubation Date/Time: 09/12/2022 7:57 AM  Performed by: Camillia Herter, RNPre-anesthesia Checklist: Patient identified, Emergency Drugs available, Suction available and Patient being monitored Patient Re-evaluated:Patient Re-evaluated prior to induction Oxygen Delivery Method: Circle system utilized Preoxygenation: Pre-oxygenation with 100% oxygen Induction Type: IV induction Ventilation: Mask ventilation without difficulty Laryngoscope Size: 2 and Miller Grade View: Grade II Tube type: Oral Tube size: 7.0 mm Number of attempts: 1 Airway Equipment and Method: Stylet Placement Confirmation: ETT inserted through vocal cords under direct vision, positive ETCO2 and breath sounds checked- equal and bilateral Secured at: 22 cm Tube secured with: Tape Dental Injury: Teeth and Oropharynx as per pre-operative assessment

## 2022-09-12 NOTE — Progress Notes (Signed)
[facility]  Updated husband. Rx sent to pharmacy. Discussed the lipoma area having necrotic fat and likely the lipoma after biopsy had a hematoma and this caused some fat necrosis. No large lipoma found subfascial or intramuscular.  Pressure dressing.   Curlene Labrum, MD [facility] [address] [place], [place] [address] [phone] (office)

## 2022-09-12 NOTE — Anesthesia Preprocedure Evaluation (Signed)
Anesthesia Evaluation  Patient identified by MRN, date of birth, ID band Patient awake    Reviewed: Allergy & Precautions, NPO status , Patient's Chart, lab work & pertinent test results  Airway Mallampati: III  TM Distance: >3 FB Neck ROM: Full    Dental  (+) Dental Advisory Given, Chipped   Pulmonary shortness of breath and with exertion, former smoker,    Pulmonary exam normal breath sounds clear to auscultation       Cardiovascular hypertension, Pt. on medications Normal cardiovascular exam Rhythm:Regular Rate:Normal     Neuro/Psych PSYCHIATRIC DISORDERS Anxiety Depression  Neuromuscular disease (neuropathy)    GI/Hepatic negative GI ROS, Neg liver ROS,   Endo/Other  diabetes, Poorly Controlled, Type 2, Oral Hypoglycemic Agents, Insulin DependentHypothyroidism   Renal/GU negative Renal ROS  negative genitourinary   Musculoskeletal negative musculoskeletal ROS (+)   Abdominal   Peds negative pediatric ROS (+)  Hematology negative hematology ROS (+)   Anesthesia Other Findings   Reproductive/Obstetrics negative OB ROS                            Anesthesia Physical Anesthesia Plan  ASA: 3  Anesthesia Plan: General   Post-op Pain Management: Dilaudid IV   Induction: Intravenous  PONV Risk Score and Plan: 4 or greater and Ondansetron and Metaclopromide  Airway Management Planned: LMA and Oral ETT  Additional Equipment:   Intra-op Plan:   Post-operative Plan: Extubation in OR  Informed Consent: I have reviewed the patients History and Physical, chart, labs and discussed the procedure including the risks, benefits and alternatives for the proposed anesthesia with the patient or authorized representative who has indicated his/her understanding and acceptance.     Dental advisory given  Plan Discussed with: CRNA and Surgeon  Anesthesia Plan Comments:        Anesthesia  Quick Evaluation

## 2022-09-14 ENCOUNTER — Encounter: Payer: Self-pay | Admitting: *Deleted

## 2022-09-14 ENCOUNTER — Encounter (HOSPITAL_COMMUNITY): Payer: Self-pay | Admitting: General Surgery

## 2022-09-14 DIAGNOSIS — I10 Essential (primary) hypertension: Secondary | ICD-10-CM | POA: Insufficient documentation

## 2022-09-14 DIAGNOSIS — K589 Irritable bowel syndrome without diarrhea: Secondary | ICD-10-CM | POA: Insufficient documentation

## 2022-09-14 DIAGNOSIS — G43909 Migraine, unspecified, not intractable, without status migrainosus: Secondary | ICD-10-CM | POA: Insufficient documentation

## 2022-09-14 DIAGNOSIS — E7800 Pure hypercholesterolemia, unspecified: Secondary | ICD-10-CM | POA: Insufficient documentation

## 2022-09-14 DIAGNOSIS — E119 Type 2 diabetes mellitus without complications: Secondary | ICD-10-CM | POA: Insufficient documentation

## 2022-09-15 LAB — SURGICAL PATHOLOGY

## 2022-09-16 NOTE — Progress Notes (Signed)
Cyst from neck and granulomatous inflammation is the result from the stuff I removed from the lipoma area. I think they biopsied the lipoma, it had a hematoma, and now has had some inflammation where it was involuting/ dying. Can you let the patient know, everything was benign.

## 2022-09-20 ENCOUNTER — Other Ambulatory Visit: Payer: Self-pay | Admitting: Nurse Practitioner

## 2022-10-03 ENCOUNTER — Ambulatory Visit (HOSPITAL_COMMUNITY): Payer: BC Managed Care – PPO | Admitting: Psychiatry

## 2022-10-05 ENCOUNTER — Ambulatory Visit (INDEPENDENT_AMBULATORY_CARE_PROVIDER_SITE_OTHER): Payer: BC Managed Care – PPO | Admitting: General Surgery

## 2022-10-05 DIAGNOSIS — D1724 Benign lipomatous neoplasm of skin and subcutaneous tissue of left leg: Secondary | ICD-10-CM

## 2022-10-05 DIAGNOSIS — R221 Localized swelling, mass and lump, neck: Secondary | ICD-10-CM

## 2022-10-05 NOTE — Progress Notes (Signed)
[facility]  I am calling the patient for post operative evaluation. This is not a billable encounter as it is under the [facility] charges for the surgery.  The patient had an excision of a left thigh lipoma and right neck cyst on 09/12/22. The patient reports that she is doing well with there procedures. The glue has come off the thigh and of it is coming off her neck. The incisions are healing but she has as scab on the ends on the thigh. The patient has no concerns.   She has some URI right now.   Pathology: A. LIPOMA, LEFT LATERAL THIGH, EXCISION:  - Granulomatous inflammation.  - PAS-F, GMS, AFB and Fite stains are negative for organisms.   B. CYST, RIGHT NECK, EXCISION:  - Epidermoid cyst with rupture and secondary pericystic reactive  changes.   Will see the patient PRN.   Curlene Labrum, MD [facility] [address] [place], [place] [address] [phone] (office)

## 2022-10-10 ENCOUNTER — Encounter: Payer: Self-pay | Admitting: Nurse Practitioner

## 2022-10-10 ENCOUNTER — Encounter (HOSPITAL_COMMUNITY): Payer: Self-pay

## 2022-10-10 ENCOUNTER — Ambulatory Visit (HOSPITAL_COMMUNITY): Payer: BC Managed Care – PPO | Admitting: Psychiatry

## 2022-10-22 ENCOUNTER — Other Ambulatory Visit: Payer: Self-pay | Admitting: Nurse Practitioner

## 2022-10-22 DIAGNOSIS — E1165 Type 2 diabetes mellitus with hyperglycemia: Secondary | ICD-10-CM

## 2022-11-03 ENCOUNTER — Other Ambulatory Visit: Payer: Self-pay | Admitting: Nurse Practitioner

## 2022-11-22 ENCOUNTER — Other Ambulatory Visit: Payer: Self-pay

## 2022-11-22 MED ORDER — DEXCOM G6 SENSOR MISC: 1 refills | Status: DC

## 2022-12-27 ENCOUNTER — Encounter: Payer: Self-pay | Admitting: Nurse Practitioner

## 2022-12-27 ENCOUNTER — Other Ambulatory Visit: Payer: Self-pay

## 2023-01-13 ENCOUNTER — Other Ambulatory Visit: Payer: Self-pay | Admitting: Nurse Practitioner

## 2023-01-16 NOTE — Telephone Encounter (Signed)
Do you know if Fusco uses [facility]?  If not, do we need to send paper copy of lab orders to him?

## 2023-01-17 ENCOUNTER — Other Ambulatory Visit: Payer: Self-pay | Admitting: Nurse Practitioner

## 2023-01-17 DIAGNOSIS — E890 Postprocedural hypothyroidism: Secondary | ICD-10-CM

## 2023-01-19 ENCOUNTER — Other Ambulatory Visit (HOSPITAL_COMMUNITY): Payer: Self-pay | Admitting: Internal Medicine

## 2023-01-19 DIAGNOSIS — Z6836 Body mass index (BMI) 36.0-36.9, adult: Secondary | ICD-10-CM | POA: Diagnosis not present

## 2023-01-19 DIAGNOSIS — M159 Polyosteoarthritis, unspecified: Secondary | ICD-10-CM | POA: Diagnosis not present

## 2023-01-19 DIAGNOSIS — E6609 Other obesity due to excess calories: Secondary | ICD-10-CM | POA: Diagnosis not present

## 2023-01-19 DIAGNOSIS — M2550 Pain in unspecified joint: Secondary | ICD-10-CM | POA: Diagnosis not present

## 2023-01-19 DIAGNOSIS — M545 Low back pain, unspecified: Secondary | ICD-10-CM | POA: Diagnosis not present

## 2023-01-19 DIAGNOSIS — G43009 Migraine without aura, not intractable, without status migrainosus: Secondary | ICD-10-CM | POA: Diagnosis not present

## 2023-01-19 DIAGNOSIS — E890 Postprocedural hypothyroidism: Secondary | ICD-10-CM | POA: Diagnosis not present

## 2023-01-19 DIAGNOSIS — M25562 Pain in left knee: Secondary | ICD-10-CM

## 2023-01-19 DIAGNOSIS — E1165 Type 2 diabetes mellitus with hyperglycemia: Secondary | ICD-10-CM | POA: Diagnosis not present

## 2023-01-19 DIAGNOSIS — D6949 Other primary thrombocytopenia: Secondary | ICD-10-CM | POA: Diagnosis not present

## 2023-01-20 ENCOUNTER — Ambulatory Visit (HOSPITAL_COMMUNITY)
Admission: RE | Admit: 2023-01-20 | Discharge: 2023-01-20 | Disposition: A | Discharge status: Home / Self Care | Payer: BC Managed Care – PPO | Source: Ambulatory Visit | Attending: Internal Medicine | Admitting: Internal Medicine

## 2023-01-20 ENCOUNTER — Encounter: Payer: Self-pay | Admitting: Nurse Practitioner

## 2023-01-20 DIAGNOSIS — M4726 Other spondylosis with radiculopathy, lumbar region: Secondary | ICD-10-CM | POA: Diagnosis not present

## 2023-01-20 DIAGNOSIS — M1712 Unilateral primary osteoarthritis, left knee: Secondary | ICD-10-CM | POA: Diagnosis not present

## 2023-01-20 DIAGNOSIS — M5116 Intervertebral disc disorders with radiculopathy, lumbar region: Secondary | ICD-10-CM | POA: Diagnosis not present

## 2023-01-20 DIAGNOSIS — M545 Low back pain, unspecified: Secondary | ICD-10-CM | POA: Insufficient documentation

## 2023-01-20 DIAGNOSIS — M25562 Pain in left knee: Secondary | ICD-10-CM | POA: Diagnosis not present

## 2023-01-20 LAB — TSH: TSH: 18.4 u[IU]/mL — ABNORMAL HIGH (ref 0.450–4.500)

## 2023-01-20 LAB — T4, FREE: Free T4: 1.22 ng/dL (ref 0.82–1.77)

## 2023-01-20 NOTE — Progress Notes (Signed)
Noted  

## 2023-01-24 ENCOUNTER — Encounter: Payer: Self-pay | Admitting: Neurology

## 2023-02-01 ENCOUNTER — Other Ambulatory Visit: Payer: Self-pay

## 2023-02-05 ENCOUNTER — Other Ambulatory Visit: Payer: Self-pay | Admitting: Nurse Practitioner

## 2023-02-14 ENCOUNTER — Other Ambulatory Visit (HOSPITAL_COMMUNITY): Payer: Self-pay | Admitting: Family Medicine

## 2023-02-14 DIAGNOSIS — M5416 Radiculopathy, lumbar region: Secondary | ICD-10-CM | POA: Diagnosis not present

## 2023-02-14 DIAGNOSIS — M545 Low back pain, unspecified: Secondary | ICD-10-CM

## 2023-02-14 DIAGNOSIS — R29898 Other symptoms and signs involving the musculoskeletal system: Secondary | ICD-10-CM

## 2023-02-17 ENCOUNTER — Encounter: Payer: Self-pay | Admitting: Nurse Practitioner

## 2023-02-20 MED ORDER — TOUJEO MAX SOLOSTAR 300 UNIT/ML ~~LOC~~ SOPN: 80.0000 [IU] | PEN_INJECTOR | Freq: Every day | SUBCUTANEOUS | 0 refills | Status: DC

## 2023-02-21 ENCOUNTER — Other Ambulatory Visit: Payer: Self-pay

## 2023-02-21 DIAGNOSIS — E1165 Type 2 diabetes mellitus with hyperglycemia: Secondary | ICD-10-CM

## 2023-02-21 MED ORDER — BD PEN NEEDLE MINI U/F 31G X 5 MM MISC: 0 refills | Status: AC

## 2023-02-27 ENCOUNTER — Telehealth: Payer: Self-pay | Admitting: Nurse Practitioner

## 2023-02-27 NOTE — Telephone Encounter (Signed)
No not this time, ill use her most recent ones.

## 2023-02-27 NOTE — Telephone Encounter (Signed)
Pt is sch for 4/8 do you want repeat labs

## 2023-02-28 NOTE — Progress Notes (Deleted)
NEUROLOGY CONSULTATION NOTE  [name] MRN: [id number] DOB: 1971/03/24  Referring provider: Redmond School, MD Primary care provider: Redmond School, MD  Reason for consult:  migraines  Assessment/Plan:   ***   Subjective:  [name] is a 52 year old female with HTN, DM II, HLD, hypothyroidism (history of thyroid cancer), IBS, depression and anxiety who presents for migraines.  History supplemented by prior neurologist's notes.  Migraines: Onset:  ***. Location:  ***. Quality:  ***. Intensity:  ***.Marland Kitchen  Aura:  ***. Prodrome:  ***. Associated symptoms:  ***.  *** denies associated unilateral numbness or weakness..Duration:  ***. Frequency:  ***. Triggers:  ***. Relieving factors:  ***. Activity:  ***  Dizziness: ***  Diabetic Peripheral Neuropathy/Chronic Neck & Back Pain: *** Lumbar X-ray from 01/20/2023 personally reviewed revealed mild diffuse spondylosis and degenerative disc disease with mild facet hypertrophy from L3-4 through L5-S1.  She is scheduled for MRI tomorrow.  ***  MRI of brain without contrast on 09/18/2020 personally reviewed revealed mild chronic small vessel ischemic changes in the cerebral white matter but otherwise unremarkable.  To assess for intracranial hypertension, she underwent LP on 11/20/2020 which revealed opening pressure of 15.8 cm H2O.  Past NSAIDS/analgesics:  *** Past abortive triptans:  *** Past abortive ergotamine:  *** Past muscle relaxants:  *** Past anti-emetic:  *** Past antihypertensive medications:  *** Past antidepressant medications:  *** Past anticonvulsant medications:  *** Past anti-CGRP:  Emgality (effective, not covered) Past vitamins/Herbal/Supplements:  *** Past antihistamines/decongestants:  *** Other past therapies:  ***  Current NSAIDS/analgesics:  Fioricet Q6h PRN, Tramadol 100mg  BID PRN (***), hydrocodone *** Current triptans:  none Current ergotamine:  none Current anti-emetic:  none Current muscle  relaxants:  none Current Antihypertensive medications:  lisinopril Current Antidepressant medications:  duloxetine 60mg  daily (***) Current Anticonvulsant medications:  gabapentin 800mg  Q6h (***) Current anti-CGRP:  none Current Vitamins/Herbal/Supplements:  D2 Current Antihistamines/Decongestants:  meclizine 25mg  Q8h PRN Other therapy:  *** Birth control:  none Benzodiazepine:  diazepam 2mg  BID PRN (***), alprazolam 0.5mg  BID PRN (***) Other medications:  Levothyroxine, Novolog, Toujeo, glimepiride   Caffeine:  *** Alcohol:  *** Smoker:  *** Diet:  *** Exercise:  *** Depression:  ***; Anxiety:  *** Other pain:  *** Sleep hygiene:  *** Family history of headache:  ***      PAST MEDICAL HISTORY: Past Medical History:  Diagnosis Date   Anxiety    BV (bacterial vaginosis)    BV   Cancer ([place]) 2009   papillary thyroid cancer   Central vestibular vertigo    Depression    Diabetes mellitus without complication ([place])    Family history of breast cancer    Hyperlipidemia    Hypertension    Hypothyroidism    IBS (irritable bowel syndrome)    Thyroid disease     PAST SURGICAL HISTORY: Past Surgical History:  Procedure Laterality Date   ABLATION     CARPAL TUNNEL RELEASE     CARPAL TUNNEL RELEASE Right    CESAREAN SECTION     CYST EXCISION N/A 09/12/2022   Procedure: CYST REMOVAL, NECK;  Surgeon: Virl Cagey, MD;  Location: [facility];  Service: General;  Laterality: N/A;   LIPOMA EXCISION Left 09/12/2022   Procedure: EXCISION LIPOMA, THIGH;  Surgeon: Virl Cagey, MD;  Location: [facility];  Service: General;  Laterality: Left;   NECK AND CHEST LESION     THYROIDECTOMY     Right and Left, 05/2008, 09/2008  WISDOM TOOTH EXTRACTION      MEDICATIONS: Current Outpatient Medications on File Prior to Visit  Medication Sig Dispense Refill   ALPRAZolam (XANAX) 0.5 MG tablet Take 0.5 mg by mouth 2 (two) times daily as needed for anxiety.     atorvastatin (LIPITOR) 20  MG tablet Take 20 mg by mouth daily.     B-D UF III MINI PEN NEEDLES 31G X 5 MM MISC USE AND DISCARD 1 PEN      NEEDLE FOR SUBCUTANEOUS    INJECTION 3 TIMES A DAY 270 each 1   B-D UF III MINI PEN NEEDLES 31G X 5 MM MISC USE AND DISCARD 1 PEN      NEEDLE IN THE MORNING, AT   NOON, IN THE EVENING, AND  AT BEDTIME 100 each 0   blood glucose meter kit and supplies KIT Dispense based on patient and insurance preference. Use up to four times daily as directed. 1 each 0   butalbital-acetaminophen-caffeine (FIORICET) 50-325-40 MG tablet Take 1 tablet by mouth 2 (two) times daily as needed for headache.     Continuous Blood Gluc Receiver (FREESTYLE LIBRE 14 DAY READER) DEVI Use to check blood glucose 4 times daily. 1 each 0   Continuous Blood Gluc Sensor (DEXCOM G6 SENSOR) MISC CHANGE SENSOR EVERY 10 DAYSAS DIRECTED 9 each 1   Continuous Blood Gluc Transmit (DEXCOM G6 TRANSMITTER) MISC CHANGE TRANSMITTER EVERY 90DAYS AS DIRECTED 1 each 1   diazepam (VALIUM) 2 MG tablet Take 2 mg by mouth 2 (two) times daily as needed for anxiety.     diphenhydrAMINE (BENADRYL) 25 MG tablet Take 25 mg by mouth daily as needed for allergies.     DULoxetine (CYMBALTA) 30 MG capsule Take 30 mg by mouth 2 (two) times daily.     fenofibrate micronized (LOFIBRA) 200 MG capsule Take 200 mg by mouth daily before breakfast.     fluconazole (DIFLUCAN) 100 MG tablet Take 100 mg by mouth daily as needed (Yeast).     gabapentin (NEURONTIN) 800 MG tablet Take 800 mg by mouth 4 (four) times daily.     glimepiride (AMARYL) 4 MG tablet Take 1 tablet (4 mg total) by mouth daily. 90 tablet 3   HYDROcodone-acetaminophen (NORCO) 7.5-325 MG tablet Take 1 tablet by mouth every 8 (eight) hours as needed for severe pain.     hydrOXYzine (ATARAX/VISTARIL) 25 MG tablet Take 25 mg by mouth every 4 (four) hours as needed.     insulin glargine, 2 Unit Dial, (TOUJEO MAX SOLOSTAR) 300 UNIT/ML Solostar Pen Inject 80 Units into the skin at bedtime.  SUBCUTANEOUSLY AT BEDTIME 9 mL 0   Lancets (ONETOUCH DELICA PLUS 123XX123) MISC Apply 1 each topically 4 (four) times daily.     levothyroxine (SYNTHROID) 175 MCG tablet TAKE 1 TABLET DAILY 90 tablet 0   lisinopril (ZESTRIL) 2.5 MG tablet Take 2.5 mg by mouth daily.     meclizine (ANTIVERT) 25 MG tablet Take 25 mg by mouth 4 (four) times daily as needed for dizziness.     Melatonin 10 MG TABS Take 10 mg by mouth at bedtime as needed (sleep).     NOVOLOG FLEXPEN 100 UNIT/ML FlexPen INJECT 12-18 UNITS         SUBCUTANEOUSLY 3 TIMES A   DAY WITH MEALS 30 mL 3   ondansetron (ZOFRAN) 4 MG tablet Take 1 tablet (4 mg total) by mouth every 8 (eight) hours as needed. 30 tablet 1   ONETOUCH VERIO test strip 1  each 4 (four) times daily.     oxyCODONE (ROXICODONE) 5 MG immediate release tablet Take 1 tablet (5 mg total) by mouth every 4 (four) hours as needed for severe pain or breakthrough pain. 20 tablet 0   scopolamine (TRANSDERM-SCOP) 1 MG/3DAYS Place 1 patch onto the skin See admin instructions. As needed     traMADol (ULTRAM) 50 MG tablet Take 100 mg by mouth 3 (three) times daily as needed for moderate pain.     Vitamin D, Ergocalciferol, (DRISDOL) 1.25 MG (50000 UNIT) CAPS capsule Take 50,000 Units by mouth every Wednesday.     No current facility-administered medications on file prior to visit.    ALLERGIES: Allergies  Allergen Reactions   Aspirin     Upset stomach    Sulfa Antibiotics Hives   Sulfamethoxazole-Trimethoprim Rash    FAMILY HISTORY: Family History  Problem Relation Age of Onset   Breast cancer Mother        died at age 76   Mental illness Mother    Hypertension Father    Hyperlipidemia Father    Diabetes Father    Breast cancer Maternal Grandmother        died in her late 62s   Lung cancer Maternal Uncle     Objective:  *** General: No acute distress.  Patient appears well-groomed.   Head:  Normocephalic/atraumatic Eyes:  fundi examined but not  visualized Neck: supple, no paraspinal tenderness, full range of motion Back: No paraspinal tenderness Heart: regular rate and rhythm Lungs: Clear to auscultation bilaterally. Vascular: No carotid bruits. Neurological Exam: Mental status: alert and oriented to person, place, and time, speech fluent and not dysarthric, language intact. Cranial nerves: CN I: not tested CN II: pupils equal, round and reactive to light, visual fields intact CN III, IV, VI:  full range of motion, no nystagmus, no ptosis CN V: facial sensation intact. CN VII: upper and lower face symmetric CN VIII: hearing intact CN IX, X: gag intact, uvula midline CN XI: sternocleidomastoid and trapezius muscles intact CN XII: tongue midline Bulk & Tone: normal, no fasciculations. Motor:  muscle strength 5/5 throughout Sensation:  Pinprick, temperature and vibratory sensation intact. Deep Tendon Reflexes:  2+ throughout,  toes downgoing.   Finger to nose testing:  Without dysmetria.   Heel to shin:  Without dysmetria.   Gait:  Normal station and stride.  Romberg negative.    Thank you for allowing me to take part in the care of this patient.  Metta Clines, DO  CC: ***

## 2023-03-01 ENCOUNTER — Ambulatory Visit: Payer: BC Managed Care – PPO | Admitting: Neurology

## 2023-03-03 ENCOUNTER — Ambulatory Visit (HOSPITAL_COMMUNITY)
Admission: RE | Admit: 2023-03-03 | Discharge: 2023-03-03 | Disposition: A | Discharge status: Home / Self Care | Payer: BC Managed Care – PPO | Source: Ambulatory Visit | Attending: Family Medicine | Admitting: Family Medicine

## 2023-03-03 DIAGNOSIS — M5416 Radiculopathy, lumbar region: Secondary | ICD-10-CM | POA: Diagnosis not present

## 2023-03-03 DIAGNOSIS — R29898 Other symptoms and signs involving the musculoskeletal system: Secondary | ICD-10-CM | POA: Diagnosis not present

## 2023-03-03 DIAGNOSIS — M545 Low back pain, unspecified: Secondary | ICD-10-CM | POA: Diagnosis not present

## 2023-03-08 DIAGNOSIS — M159 Polyosteoarthritis, unspecified: Secondary | ICD-10-CM | POA: Diagnosis not present

## 2023-03-08 DIAGNOSIS — E063 Autoimmune thyroiditis: Secondary | ICD-10-CM | POA: Diagnosis not present

## 2023-03-08 DIAGNOSIS — E1165 Type 2 diabetes mellitus with hyperglycemia: Secondary | ICD-10-CM | POA: Diagnosis not present

## 2023-03-08 DIAGNOSIS — M545 Low back pain, unspecified: Secondary | ICD-10-CM | POA: Diagnosis not present

## 2023-03-10 ENCOUNTER — Other Ambulatory Visit: Payer: Self-pay | Admitting: Nurse Practitioner

## 2023-03-10 DIAGNOSIS — M5416 Radiculopathy, lumbar region: Secondary | ICD-10-CM | POA: Diagnosis not present

## 2023-03-13 ENCOUNTER — Ambulatory Visit: Payer: BC Managed Care – PPO | Admitting: Nurse Practitioner

## 2023-03-17 DIAGNOSIS — M5416 Radiculopathy, lumbar region: Secondary | ICD-10-CM | POA: Diagnosis not present

## 2023-03-23 ENCOUNTER — Ambulatory Visit (INDEPENDENT_AMBULATORY_CARE_PROVIDER_SITE_OTHER): Payer: BC Managed Care – PPO | Admitting: Nurse Practitioner

## 2023-03-23 ENCOUNTER — Encounter: Payer: Self-pay | Admitting: Nurse Practitioner

## 2023-03-23 ENCOUNTER — Ambulatory Visit: Payer: Medicare Other | Admitting: Nurse Practitioner

## 2023-03-23 VITALS — BP 116/70 | HR 66 | Ht 64.5 in | Wt 225.6 lb

## 2023-03-23 DIAGNOSIS — E1165 Type 2 diabetes mellitus with hyperglycemia: Secondary | ICD-10-CM | POA: Diagnosis not present

## 2023-03-23 DIAGNOSIS — E890 Postprocedural hypothyroidism: Secondary | ICD-10-CM

## 2023-03-23 DIAGNOSIS — F5081 Binge eating disorder: Secondary | ICD-10-CM

## 2023-03-23 DIAGNOSIS — E039 Hypothyroidism, unspecified: Secondary | ICD-10-CM

## 2023-03-23 LAB — POCT UA - MICROALBUMIN
Albumin/Creatinine Ratio, Urine, POC: <30
Albumin: 30
Creatinine, POC: 100 mg/dL

## 2023-03-23 LAB — POCT GLYCOSYLATED HEMOGLOBIN (HGB A1C): Hemoglobin A1C: 8.6 % — AB (ref 4.0–5.6)

## 2023-03-23 MED ORDER — DEXCOM G7 SENSOR MISC: 1.0000 | 3 refills | Status: DC

## 2023-03-23 MED ORDER — GLIMEPIRIDE 4 MG PO TABS: 4.0000 mg | ORAL_TABLET | Freq: Every day | ORAL | 3 refills | Status: DC

## 2023-03-23 MED ORDER — TOUJEO MAX SOLOSTAR 300 UNIT/ML ~~LOC~~ SOPN: 80.0000 [IU] | PEN_INJECTOR | Freq: Every day | SUBCUTANEOUS | 3 refills | Status: DC

## 2023-03-23 MED ORDER — NOVOLOG FLEXPEN 100 UNIT/ML ~~LOC~~ SOPN: 15.0000 [IU] | PEN_INJECTOR | Freq: Three times a day (TID) | SUBCUTANEOUS | 3 refills | Status: DC

## 2023-03-23 MED ORDER — LEVOTHYROXINE SODIUM 175 MCG PO TABS: 175.0000 ug | ORAL_TABLET | Freq: Every day | ORAL | 0 refills | Status: DC

## 2023-03-23 NOTE — Progress Notes (Addendum)
Endocrinology Follow Up Note       03/24/2023, 7:51 AM   Subjective:    Patient ID: [name], female    DOB: 25-May-1971.  [name] is being seen in follow up after being seen in consultation for management of currently uncontrolled symptomatic diabetes requested by  Elfredia Nevins, MD.   Past Medical History:  Diagnosis Date   Anxiety    BV (bacterial vaginosis)    BV   Cancer 2009   papillary thyroid cancer   Central vestibular vertigo    Depression    Diabetes mellitus without complication    Family history of breast cancer    Hyperlipidemia    Hypertension    Hypothyroidism    IBS (irritable bowel syndrome)    Thyroid disease     Past Surgical History:  Procedure Laterality Date   ABLATION     CARPAL TUNNEL RELEASE     CARPAL TUNNEL RELEASE Right    CESAREAN SECTION     CYST EXCISION N/A 09/12/2022   Procedure: CYST REMOVAL, NECK;  Surgeon: Lucretia Roers, MD;  Location: [facility];  Service: General;  Laterality: N/A;   LIPOMA EXCISION Left 09/12/2022   Procedure: EXCISION LIPOMA, THIGH;  Surgeon: Lucretia Roers, MD;  Location: [facility];  Service: General;  Laterality: Left;   NECK AND CHEST LESION     THYROIDECTOMY     Right and Left, 05/2008, 09/2008   WISDOM TOOTH EXTRACTION      Social History   Socioeconomic History   Marital status: Married    Spouse name: Not on file   Number of children: Not on file   Years of education: Not on file   Highest education level: Not on file  Occupational History   Not on file  Tobacco Use   Smoking status: Former   Smokeless tobacco: Never  Vaping Use   Vaping Use: Never used  Substance and Sexual Activity   Alcohol use: Yes    Comment: occ   Drug use: Never   Sexual activity: Yes    Comment: ablation  Other Topics Concern   Not on file  Social History Narrative   Not on file   Social Determinants of Health   Financial  Resource Strain: Not on file  Food Insecurity: Not on file  Transportation Needs: Not on file  Physical Activity: Not on file  Stress: Not on file  Social Connections: Not on file    Family History  Problem Relation Age of Onset   Breast cancer Mother        died at age 94   Mental illness Mother    Hypertension Father    Hyperlipidemia Father    Diabetes Father    Breast cancer Maternal Grandmother        died in her late 24s   Lung cancer Maternal Uncle     Outpatient Encounter Medications as of 03/23/2023  Medication Sig   ALPRAZolam (XANAX) 0.5 MG tablet Take 0.5 mg by mouth 2 (two) times daily as needed for anxiety.   atorvastatin (LIPITOR) 20 MG tablet Take 20 mg by mouth daily.   B-D UF III MINI  PEN NEEDLES 31G X 5 MM MISC USE AND DISCARD 1 PEN      NEEDLE FOR SUBCUTANEOUS    INJECTION 3 TIMES A DAY   B-D UF III MINI PEN NEEDLES 31G X 5 MM MISC USE AND DISCARD 1 PEN      NEEDLE IN THE MORNING, AT   NOON, IN THE EVENING, AND  AT BEDTIME   blood glucose meter kit and supplies KIT Dispense based on patient and insurance preference. Use up to four times daily as directed.   butalbital-acetaminophen-caffeine (FIORICET) 50-325-40 MG tablet Take 1 tablet by mouth 2 (two) times daily as needed for headache.   Continuous Blood Gluc Transmit (DEXCOM G6 TRANSMITTER) MISC CHANGE TRANSMITTER EVERY 90DAYS AS DIRECTED   Continuous Glucose Sensor (DEXCOM G7 SENSOR) MISC Inject 1 Application into the skin as directed. Change sensor every 10 days as directed.   diazepam (VALIUM) 2 MG tablet Take 2 mg by mouth 2 (two) times daily as needed for anxiety.   diphenhydrAMINE (BENADRYL) 25 MG tablet Take 25 mg by mouth daily as needed for allergies.   DULoxetine (CYMBALTA) 30 MG capsule Take 30 mg by mouth 2 (two) times daily.   fenofibrate micronized (LOFIBRA) 200 MG capsule Take 200 mg by mouth daily before breakfast.   gabapentin (NEURONTIN) 800 MG tablet Take 800 mg by mouth 4 (four) times daily.    HYDROcodone-acetaminophen (NORCO) 7.5-325 MG tablet Take 1 tablet by mouth every 8 (eight) hours as needed for severe pain.   hydrOXYzine (ATARAX/VISTARIL) 25 MG tablet Take 25 mg by mouth every 4 (four) hours as needed.   Lancets (ONETOUCH DELICA PLUS LANCET33G) MISC Apply 1 each topically 4 (four) times daily.   lisinopril (ZESTRIL) 2.5 MG tablet Take 2.5 mg by mouth daily.   meclizine (ANTIVERT) 25 MG tablet Take 25 mg by mouth 4 (four) times daily as needed for dizziness.   Melatonin 10 MG TABS Take 10 mg by mouth at bedtime as needed (sleep).   meloxicam (MOBIC) 7.5 MG tablet Take 7.5 mg by mouth 2 (two) times daily as needed for pain.   ondansetron (ZOFRAN) 4 MG tablet Take 1 tablet (4 mg total) by mouth every 8 (eight) hours as needed.   ONETOUCH VERIO test strip 1 each 4 (four) times daily.   scopolamine (TRANSDERM-SCOP) 1 MG/3DAYS Place 1 patch onto the skin See admin instructions. As needed   traMADol (ULTRAM) 50 MG tablet Take 100 mg by mouth 3 (three) times daily as needed for moderate pain.   Vitamin D, Ergocalciferol, (DRISDOL) 1.25 MG (50000 UNIT) CAPS capsule Take 50,000 Units by mouth every Wednesday.   [DISCONTINUED] Continuous Blood Gluc Sensor (DEXCOM G6 SENSOR) MISC CHANGE SENSOR EVERY 10 DAYSAS DIRECTED   [DISCONTINUED] glimepiride (AMARYL) 4 MG tablet Take 1 tablet (4 mg total) by mouth daily.   [DISCONTINUED] insulin glargine, 2 Unit Dial, (TOUJEO MAX SOLOSTAR) 300 UNIT/ML Solostar Pen INJECT 80 UNITS            SUBCUTANEOUSLY AT BEDTIME   [DISCONTINUED] levothyroxine (SYNTHROID) 175 MCG tablet TAKE 1 TABLET DAILY   [DISCONTINUED] NOVOLOG FLEXPEN 100 UNIT/ML FlexPen INJECT 12-18 UNITS         SUBCUTANEOUSLY 3 TIMES A   DAY WITH MEALS   fluconazole (DIFLUCAN) 100 MG tablet Take 100 mg by mouth daily as needed (Yeast). (Patient not taking: Reported on 03/23/2023)   glimepiride (AMARYL) 4 MG tablet Take 1 tablet (4 mg total) by mouth daily.   insulin aspart (NOVOLOG FLEXPEN)  100 UNIT/ML FlexPen Inject 15-21 Units into the skin 3 (three) times daily with meals.   insulin glargine, 2 Unit Dial, (TOUJEO MAX SOLOSTAR) 300 UNIT/ML Solostar Pen Inject 80 Units into the skin at bedtime.   levothyroxine (SYNTHROID) 175 MCG tablet Take 1 tablet (175 mcg total) by mouth daily.   oxyCODONE (ROXICODONE) 5 MG immediate release tablet Take 1 tablet (5 mg total) by mouth every 4 (four) hours as needed for severe pain or breakthrough pain. (Patient not taking: Reported on 03/23/2023)   [DISCONTINUED] Continuous Blood Gluc Receiver (FREESTYLE LIBRE 14 DAY READER) DEVI Use to check blood glucose 4 times daily. (Patient not taking: Reported on 03/23/2023)   No facility-administered encounter medications on file as of 03/23/2023.    ALLERGIES: Allergies  Allergen Reactions   Aspirin     Upset stomach    Sulfa Antibiotics Hives   Sulfamethoxazole-Trimethoprim Rash    VACCINATION STATUS:  There is no immunization history on file for this patient.  Diabetes She presents for her follow-up diabetic visit. She has type 2 diabetes mellitus. Onset time: had never been formally diagnosed- has gest DM with both children- started on meds about 1 year ago at age of 74. Her disease course has been improving. There are no hypoglycemic associated symptoms. Associated symptoms include blurred vision, fatigue, foot paresthesias, polydipsia and polyuria. There are no hypoglycemic complications. Symptoms are improving. Diabetic complications include nephropathy and peripheral neuropathy. Risk factors for coronary artery disease include diabetes mellitus, dyslipidemia, family history, obesity, hypertension and sedentary lifestyle. Current diabetic treatment includes oral agent (monotherapy) and intensive insulin program. She is compliant with treatment most of the time. Her weight is increasing steadily. She is following a generally healthy diet. When asked about meal planning, she reported none. She has  not had a previous visit with a dietitian. She rarely participates in exercise. Her home blood glucose trend is decreasing steadily. Her overall blood glucose range is >200 mg/dl. (She presents today, accompanied by her daughter, with her CGM showing improved yet still above target glycemic profile.  Her POCT A1c today is 8.6%, improving from last visit of 9.7%.  Since last visit she has had several cysts removed (including the one on her neck).  She notes she still has an unhealthy relationship with food, tends to eat her feelings.  Analysis of her CGM shows TIR 25%, TAR 74%, TBR <1% with a GMI of 8.8%.  ) An ACE inhibitor/angiotensin II receptor blocker is being taken. She does not see a podiatrist.Eye exam is current.     Review of systems  Constitutional: + steadily increasing body weight,  current Body mass index is 38.13 kg/m. , + fatigue, no subjective hyperthermia, no subjective hypothermia Eyes: no blurry vision, no xerophthalmia ENT: no sore throat, no dysphagia/odynophagia, no hoarseness Cardiovascular: no chest pain, no shortness of breath, no palpitations, no leg swelling Respiratory: no cough, no shortness of breath Gastrointestinal: no nausea/vomiting/diarrhea Musculoskeletal: no muscle/joint aches Skin: no rashes, no hyperemia Neurological: no tremors, no numbness, no tingling, intermittent dizziness- hx of orthostatic hypotension Psychiatric: no depression, no anxiety  Objective:     BP 116/70 (BP Location: Left Arm, Patient Position: Sitting, Cuff Size: Large)   Pulse 66   Ht 5' 4.5" (1.638 m)   Wt 225 lb 9.6 oz (102.3 kg)   BMI 38.13 kg/m   Wt Readings from Last 3 Encounters:  03/23/23 225 lb 9.6 oz (102.3 kg)  09/08/22 219 lb (99.3 kg)  09/06/22 219 lb (99.3  kg)     BP Readings from Last 3 Encounters:  03/23/23 116/70  09/12/22 (!) 142/73  09/06/22 135/78      Physical Exam- Limited  Constitutional:  Body mass index is 38.13 kg/m. , not in acute  distress, normal state of mind Eyes:  EOMI, no exophthalmos Musculoskeletal: no gross deformities, strength intact in all four extremities, no gross restriction of joint movements Skin:  no rashes, no hyperemia Neurological: no tremor with outstretched hands   Diabetic Foot Exam - Simple   Simple Foot Form Diabetic Foot exam was performed with the following findings: Yes 03/23/2023  3:30 PM  Visual Inspection No deformities, no ulcerations, no other skin breakdown bilaterally: Yes Sensation Testing Intact to touch and monofilament testing bilaterally: Yes Pulse Check Posterior Tibialis and Dorsalis pulse intact bilaterally: Yes Comments      CMP ( most recent) CMP     Component Value Date/Time   NA 136 09/08/2022 0957   NA 130 (L) 01/07/2022 1017   K 4.3 09/08/2022 0957   CL 102 09/08/2022 0957   CO2 23 09/08/2022 0957   GLUCOSE 220 (H) 09/08/2022 0957   BUN 13 09/08/2022 0957   BUN 13 01/07/2022 1017   CREATININE 0.81 09/08/2022 0957   CALCIUM 9.4 09/08/2022 0957   PROT 8.0 01/07/2022 1017   ALBUMIN 30 mg/L 03/23/2023 1553   AST 31 01/07/2022 1017   ALT 69 (H) 01/07/2022 1017   ALKPHOS 66 01/07/2022 1017   BILITOT 0.3 01/07/2022 1017   GFRNONAA >60 09/08/2022 0957   GFRAA >60 05/18/2011 0856     Diabetic Labs (most recent): Lab Results  Component Value Date   HGBA1C 8.6 (A) 03/23/2023   HGBA1C 9.7 (H) 09/08/2022   HGBA1C 8.9 (A) 05/27/2022   MICROALBUR 10 12/24/2021     Lipid Panel ( most recent) Lipid Panel  No results found for: "CHOL", "TRIG", "HDL", "CHOLHDL", "VLDL", "LDLCALC", "LDLDIRECT", "LABVLDL"    Lab Results  Component Value Date   TSH 18.400 (H) 01/19/2023   TSH 3.630 01/07/2022   TSH 2.639 10/18/2013   FREET4 1.22 01/19/2023   FREET4 1.20 01/07/2022       Thyroid US from 05/20/22 CLINICAL DATA:  History of papillary thyroid cancer, status post total thyroidectomy   EXAM: THYROID ULTRASOUND   TECHNIQUE: Ultrasound examination of  the thyroid gland and adjacent soft tissues was performed.   COMPARISON:  01/09/2008   FINDINGS: Patient is status post total thyroidectomy. No thyroid bed abnormality, nodule, or residual thyroid tissue appreciated.   However, in the right superior neck in the submandibular region, there is a superficial solid hypoechoic nodule measuring 1.7 x 1.5 x 1.3 cm, concerning for a pathologic enlarged abnormal lymph node. Consider further evaluation with neck CT with contrast. This would be amenable to ultrasound core biopsy. Additional left neck benign lymph nodes are noted for comparison.   No additional neck mass bulky adenopathy by ultrasound.   IMPRESSION: Remote total thyroidectomy.   Right submandibular region 1.7 cm indeterminate soft tissue mass concerning for a pathologic enlarged lymph node. See above comment and recommendation.   These results will be called to the ordering clinician or representative by the Radiologist Assistant, and communication documented in the PACS or [employer].   The above is in keeping with the ACR TI-RADS recommendations - J Am Coll Radiol 2017;14:587-595.     Electronically Signed   By: Judie Petit.  Shick M.D.   On: 05/20/2022 11:42    Assessment & Plan:  1) Uncontrolled Type 2 Diabetes with hyperglycemia  She presents today, accompanied by her daughter, with her CGM showing improved yet still above target glycemic profile.  Her POCT A1c today is 8.6%, improving from last visit of 9.7%.  Since last visit she has had several cysts removed (including the one on her neck).  She notes she still has an unhealthy relationship with food, tends to eat her feelings.  Analysis of her CGM shows TIR 25%, TAR 74%, TBR <1% with a GMI of 8.8%.    - OPRAH CAMARENA has currently uncontrolled symptomatic type 2 DM since 52 years of age.   -Recent labs reviewed.  - I had a long discussion with her about the progressive nature of diabetes and the pathology  behind its complications. -her diabetes is complicated by neuropathy and she remains at a high risk for more acute and chronic complications which include CAD, CVA, CKD, retinopathy, and neuropathy. These are all discussed in detail with her.  - Nutritional counseling repeated at each appointment due to patients tendency to fall back in to old habits.  - The patient admits there is a room for improvement in their diet and drink choices. -  Suggestion is made for the patient to avoid simple carbohydrates from their diet including Cakes, Sweet Desserts / Pastries, Ice Cream, Soda (diet and regular), Sweet Tea, Candies, Chips, Cookies, Sweet Pastries, Store Bought Juices, Alcohol in Excess of 1-2 drinks a day, Artificial Sweeteners, Coffee Creamer, and "Sugar-free" Products. This will help patient to have stable blood glucose profile and potentially avoid unintended weight gain.   - I encouraged the patient to switch to unprocessed or minimally processed complex starch and increased protein intake (animal or plant source), fruits, and vegetables.   - Patient is advised to stick to a routine mealtimes to eat 3 meals a day and avoid unnecessary snacks (to snack only to correct hypoglycemia).  - she will be scheduled with Norm Salt, RDN, CDE for diabetes education.  Has appt coming up next week.  - I have approached her with the following individualized plan to manage her diabetes and patient agrees:   -She is advised to continue Toujeo 80 units SQ nightly and increase her Novolog to 15-21 units TID with meals if glucose is above 90 and she is eating (Specific instructions on how to titrate insulin dosage based on glucose readings given to patient in writing).  She can also continue her Glimepiride 4 mg po daily with breakfast.  I did enter referral to psychology today for counseling regarding her unhealthy relationship with food.  -she is encouraged to continue monitoring blood glucose 4 times  daily (using her CGM), before meals and before bed, and to call the clinic if she has readings less than 70 or above 300 for 3 tests in a row.    - she is warned not to take insulin without proper monitoring per orders. - Adjustment parameters are given to her for hypo and hyperglycemia in writing.  - she is not an ideal candidate for GLP1 due to history of papillary thyroid cancer, s/p total thyroidectomy.  It is too much of a risk to even consider using it.  - Specific targets for  A1c; LDL, HDL, and Triglycerides were discussed with the patient.  2) Blood Pressure /Hypertension:  her blood pressure is controlled to target.   she is advised to continue her current medications including Lisinopril 2.5 mg p.o. daily with breakfast.  3) Lipids/Hyperlipidemia:  There is no recent lipid panel available to review.  she is advised to continue Lipitor 10 mg daily at bedtime and Fenofibrate 200 mg po daily.  Side effects and precautions discussed with her.  Will recheck lipid panel prior to next visit.  4)  Weight/Diet:  her Body mass index is 38.13 kg/m.  -  clearly complicating her diabetes care.   she is a candidate for weight loss. I discussed with her the fact that loss of 5 - 10% of her  current body weight will have the most impact on her diabetes management.  Exercise, and detailed carbohydrates information provided  -  detailed on discharge instructions.  5) Hypothyroidism- r/t total thyroidectomy for thyroid cancer -She is currently on Levothyroxine 175 mcg po daily before breakfast.   Will recheck TFTs prior to next visit and adjust dose accordingly.   - The correct intake of thyroid hormone (Levothyroxine, Synthroid), is on empty stomach first thing in the morning, with water, separated by at least 30 minutes from breakfast and other medications,  and separated by more than 4 hours from calcium, iron, multivitamins, acid reflux medications (PPIs).  - This medication is a life-long  medication and will be needed to correct thyroid hormone imbalances for the rest of your life.  The dose may change from time to time, based on thyroid blood work.  - It is extremely important to be consistent taking this medication, near the same time each morning.  -AVOID TAKING PRODUCTS CONTAINING BIOTIN (commonly found in Hair, Skin, Nails vitamins) AS IT INTERFERES WITH THE VALIDITY OF THYROID FUNCTION BLOOD TESTS.  6) Vitamin D deficiency Her most recent vitamin D level was 9 on 08/05/21.  She is currently on Ergocalciferol 50000 units weekly.  Will recheck prior to next visit.  7) Chronic Care/Health Maintenance: -she is on ACEI/ARB and Statin medications and is encouraged to initiate and continue to follow up with Ophthalmology, Dentist, Podiatrist at least yearly or according to recommendations, and advised to stay away from smoking. I have recommended yearly flu vaccine and pneumonia vaccine at least every 5 years; moderate intensity exercise for up to 150 minutes weekly; and sleep for at least 7 hours a day.  - she is advised to maintain close follow up with Elfredia Nevins, MD for primary care needs, as well as her other providers for optimal and coordinated care.     I spent  51  minutes in the care of the patient today including review of labs from CMP, Lipids, Thyroid Function, Hematology (current and previous including abstractions from other facilities); face-to-face time discussing  her blood glucose readings/logs, discussing hypoglycemia and hyperglycemia episodes and symptoms, medications doses, her options of short and long term treatment based on the latest standards of care / guidelines;  discussion about incorporating lifestyle medicine;  and documenting the encounter. Risk reduction counseling performed per USPSTF guidelines to reduce obesity and cardiovascular risk factors.     Please refer to Patient Instructions for Blood Glucose Monitoring and Insulin/Medications Dosing  Guide"  in media tab for additional information. Please  also refer to " Patient Self Inventory" in the Media  tab for reviewed elements of pertinent patient history.  Alben Spittle participated in the discussions, expressed understanding, and voiced agreement with the above plans.  All questions were answered to her satisfaction. she is encouraged to contact clinic should she have any questions or concerns prior to her return visit.     Follow up plan: - Return  in about 3 months (around 06/22/2023) for Diabetes F/U with A1c in office, Bring meter and logs, Previsit labs, Thyroid follow up.   Ronny Bacon, [facility] [address] [place], [place] [address] Phone: [phone] Fax: [phone]  03/24/2023, 7:51 AM

## 2023-03-23 NOTE — Patient Instructions (Signed)

## 2023-03-28 ENCOUNTER — Encounter: Payer: Self-pay | Admitting: Nurse Practitioner

## 2023-04-06 DIAGNOSIS — M5416 Radiculopathy, lumbar region: Secondary | ICD-10-CM | POA: Diagnosis not present

## 2023-04-07 ENCOUNTER — Other Ambulatory Visit: Payer: Self-pay | Admitting: Nurse Practitioner

## 2023-04-07 NOTE — Telephone Encounter (Signed)
I just put that one in at her last appt and I havent seen anything come back so I am not sure.

## 2023-04-07 NOTE — Telephone Encounter (Signed)
Do you know if she was ever approved for the G7?

## 2023-04-10 NOTE — Telephone Encounter (Signed)
Left a message requesting pt return call to the office. 

## 2023-04-11 ENCOUNTER — Encounter: Payer: Self-pay | Admitting: Nurse Practitioner

## 2023-04-11 NOTE — Telephone Encounter (Signed)
Thanks so much. 

## 2023-04-19 DIAGNOSIS — I10 Essential (primary) hypertension: Secondary | ICD-10-CM | POA: Diagnosis not present

## 2023-04-19 NOTE — Telephone Encounter (Signed)
Hi Latisha!  My patient has reached out to this person and has not gotten any response.  Is there a different mental health provider that you know of that treats disorders regarding food (binge eating, emotional eating habits, etc)?

## 2023-04-26 ENCOUNTER — Encounter (HOSPITAL_COMMUNITY): Payer: Self-pay

## 2023-04-28 DIAGNOSIS — M5416 Radiculopathy, lumbar region: Secondary | ICD-10-CM | POA: Diagnosis not present

## 2023-04-30 ENCOUNTER — Encounter: Payer: Self-pay | Admitting: Nurse Practitioner

## 2023-05-03 ENCOUNTER — Encounter: Payer: Self-pay | Admitting: Nurse Practitioner

## 2023-05-09 ENCOUNTER — Other Ambulatory Visit (HOSPITAL_COMMUNITY): Payer: Self-pay | Admitting: Internal Medicine

## 2023-05-09 DIAGNOSIS — Z1231 Encounter for screening mammogram for malignant neoplasm of breast: Secondary | ICD-10-CM

## 2023-05-19 ENCOUNTER — Inpatient Hospital Stay (HOSPITAL_COMMUNITY): Admission: RE | Admit: 2023-05-19 | Payer: Medicare Other | Source: Ambulatory Visit

## 2023-05-19 DIAGNOSIS — Z1231 Encounter for screening mammogram for malignant neoplasm of breast: Secondary | ICD-10-CM

## 2023-05-26 ENCOUNTER — Ambulatory Visit (HOSPITAL_COMMUNITY): Payer: Medicare Other

## 2023-05-26 DIAGNOSIS — M5416 Radiculopathy, lumbar region: Secondary | ICD-10-CM | POA: Diagnosis not present

## 2023-06-05 ENCOUNTER — Encounter (HOSPITAL_COMMUNITY): Payer: Self-pay

## 2023-06-05 ENCOUNTER — Ambulatory Visit (HOSPITAL_COMMUNITY)
Admission: RE | Admit: 2023-06-05 | Discharge: 2023-06-05 | Disposition: A | Discharge status: Home / Self Care | Payer: BC Managed Care – PPO | Source: Ambulatory Visit | Attending: Internal Medicine | Admitting: Internal Medicine

## 2023-06-05 DIAGNOSIS — Z1231 Encounter for screening mammogram for malignant neoplasm of breast: Secondary | ICD-10-CM

## 2023-06-15 ENCOUNTER — Encounter: Payer: Self-pay | Admitting: Nurse Practitioner

## 2023-06-19 DIAGNOSIS — Z6839 Body mass index (BMI) 39.0-39.9, adult: Secondary | ICD-10-CM | POA: Diagnosis not present

## 2023-06-19 DIAGNOSIS — M5442 Lumbago with sciatica, left side: Secondary | ICD-10-CM | POA: Diagnosis not present

## 2023-07-06 DIAGNOSIS — D6949 Other primary thrombocytopenia: Secondary | ICD-10-CM | POA: Diagnosis not present

## 2023-07-06 DIAGNOSIS — Z1331 Encounter for screening for depression: Secondary | ICD-10-CM | POA: Diagnosis not present

## 2023-07-06 DIAGNOSIS — E063 Autoimmune thyroiditis: Secondary | ICD-10-CM | POA: Diagnosis not present

## 2023-07-06 DIAGNOSIS — Z0001 Encounter for general adult medical examination with abnormal findings: Secondary | ICD-10-CM | POA: Diagnosis not present

## 2023-07-06 DIAGNOSIS — E6609 Other obesity due to excess calories: Secondary | ICD-10-CM | POA: Diagnosis not present

## 2023-07-06 DIAGNOSIS — M159 Polyosteoarthritis, unspecified: Secondary | ICD-10-CM | POA: Diagnosis not present

## 2023-07-06 DIAGNOSIS — E1165 Type 2 diabetes mellitus with hyperglycemia: Secondary | ICD-10-CM | POA: Diagnosis not present

## 2023-07-06 DIAGNOSIS — Z9229 Personal history of other drug therapy: Secondary | ICD-10-CM | POA: Diagnosis not present

## 2023-07-06 DIAGNOSIS — Z6835 Body mass index (BMI) 35.0-35.9, adult: Secondary | ICD-10-CM | POA: Diagnosis not present

## 2023-07-06 DIAGNOSIS — M545 Low back pain, unspecified: Secondary | ICD-10-CM | POA: Diagnosis not present

## 2023-07-06 DIAGNOSIS — I10 Essential (primary) hypertension: Secondary | ICD-10-CM | POA: Diagnosis not present

## 2023-07-06 DIAGNOSIS — M2550 Pain in unspecified joint: Secondary | ICD-10-CM | POA: Diagnosis not present

## 2023-07-07 ENCOUNTER — Ambulatory Visit: Payer: BC Managed Care – PPO | Admitting: Nurse Practitioner

## 2023-07-10 ENCOUNTER — Other Ambulatory Visit (HOSPITAL_COMMUNITY): Payer: Self-pay | Admitting: Neurosurgery

## 2023-07-10 DIAGNOSIS — M545 Low back pain, unspecified: Secondary | ICD-10-CM

## 2023-07-16 ENCOUNTER — Other Ambulatory Visit: Payer: Self-pay | Admitting: Nurse Practitioner

## 2023-07-21 ENCOUNTER — Other Ambulatory Visit: Payer: Self-pay

## 2023-07-21 ENCOUNTER — Ambulatory Visit (HOSPITAL_COMMUNITY)
Admission: RE | Admit: 2023-07-21 | Discharge: 2023-07-21 | Disposition: A | Discharge status: Home / Self Care | Payer: BC Managed Care – PPO | Source: Ambulatory Visit | Attending: Neurosurgery | Admitting: Neurosurgery

## 2023-07-21 DIAGNOSIS — M5126 Other intervertebral disc displacement, lumbar region: Secondary | ICD-10-CM | POA: Diagnosis not present

## 2023-07-21 DIAGNOSIS — M545 Low back pain, unspecified: Secondary | ICD-10-CM

## 2023-07-21 DIAGNOSIS — M5116 Intervertebral disc disorders with radiculopathy, lumbar region: Secondary | ICD-10-CM | POA: Diagnosis not present

## 2023-07-21 DIAGNOSIS — M48061 Spinal stenosis, lumbar region without neurogenic claudication: Secondary | ICD-10-CM | POA: Diagnosis not present

## 2023-07-21 DIAGNOSIS — M4726 Other spondylosis with radiculopathy, lumbar region: Secondary | ICD-10-CM | POA: Diagnosis not present

## 2023-07-21 DIAGNOSIS — M5416 Radiculopathy, lumbar region: Secondary | ICD-10-CM | POA: Diagnosis not present

## 2023-07-21 DIAGNOSIS — M47816 Spondylosis without myelopathy or radiculopathy, lumbar region: Secondary | ICD-10-CM | POA: Diagnosis not present

## 2023-07-21 LAB — GLUCOSE, CAPILLARY
Glucose-Capillary: 222 mg/dL — ABNORMAL HIGH (ref 70–99)
Glucose-Capillary: 182 mg/dL — ABNORMAL HIGH (ref 70–99)

## 2023-07-21 MED ORDER — LIDOCAINE HCL (PF) 1 % IJ SOLN
5.0000 mL | Freq: Once | INTRAMUSCULAR | Status: AC
  Administered 2023-07-21: 5 mL via INTRADERMAL

## 2023-07-21 MED ORDER — IOHEXOL 180 MG/ML  SOLN
20.0000 mL | Freq: Once | INTRAMUSCULAR | Status: AC | PRN
  Administered 2023-07-21: 20 mL via INTRATHECAL

## 2023-07-21 MED ORDER — OXYCODONE HCL 5 MG PO TABS
5.0000 mg | ORAL_TABLET | ORAL | Status: DC | PRN
  Administered 2023-07-21: 10 mg via ORAL
  Filled 2023-07-21: qty 2

## 2023-07-21 MED ORDER — ONDANSETRON HCL 4 MG/2ML IJ SOLN: 4.0000 mg | Freq: Four times a day (QID) | INTRAMUSCULAR | Status: DC | PRN

## 2023-07-21 NOTE — Op Note (Signed)
07/21/2023 lumbar Myelogram  PATIENT:  [name] is a 52 y.o. female  PRE-OPERATIVE DIAGNOSIS:  lumbar radiculopathy  POST-OPERATIVE DIAGNOSIS:  same  PROCEDURE:  Lumbar Myelogram  SURGEON:  Rudolph Dobler  ANESTHESIA:   local LOCAL MEDICATIONS USED:  LIDOCAINE  Procedure Note: [name] is a 52 y.o. female Was taken to the fluoroscopy suite and  positioned prone on the fluoroscopy table. Her back was prepared and draped in a sterile manner. I infiltrated 63 cc into the lumbar region. I then introduced a spinal needle into the thecal sac at the L3/4 interlaminar space. I infiltrated 20cc of Isovue 180 into the thecal sac. Fluoroscopy showed the needle and contrast in the thecal sac. [name] tolerated the procedure well. she Will be taken to CT for evaluation.     PATIENT DISPOSITION:  Short Stay

## 2023-07-31 ENCOUNTER — Encounter: Payer: Self-pay | Admitting: Nurse Practitioner

## 2023-07-31 DIAGNOSIS — M5442 Lumbago with sciatica, left side: Secondary | ICD-10-CM | POA: Diagnosis not present

## 2023-07-31 DIAGNOSIS — Z6837 Body mass index (BMI) 37.0-37.9, adult: Secondary | ICD-10-CM | POA: Diagnosis not present

## 2023-08-01 ENCOUNTER — Other Ambulatory Visit: Payer: Self-pay | Admitting: Nurse Practitioner

## 2023-08-02 ENCOUNTER — Encounter (HOSPITAL_COMMUNITY): Payer: Self-pay | Admitting: Neurosurgery

## 2023-08-02 DIAGNOSIS — M5442 Lumbago with sciatica, left side: Secondary | ICD-10-CM

## 2023-08-03 ENCOUNTER — Encounter (HOSPITAL_COMMUNITY): Payer: Self-pay | Admitting: Neurosurgery

## 2023-08-03 DIAGNOSIS — M4726 Other spondylosis with radiculopathy, lumbar region: Secondary | ICD-10-CM

## 2023-08-04 ENCOUNTER — Ambulatory Visit: Payer: Medicare Other | Admitting: Obstetrics & Gynecology

## 2023-08-10 ENCOUNTER — Other Ambulatory Visit (HOSPITAL_COMMUNITY): Payer: Self-pay | Admitting: Neurosurgery

## 2023-08-10 DIAGNOSIS — R1084 Generalized abdominal pain: Secondary | ICD-10-CM

## 2023-08-14 DIAGNOSIS — I10 Essential (primary) hypertension: Secondary | ICD-10-CM | POA: Diagnosis not present

## 2023-08-14 DIAGNOSIS — Z0001 Encounter for general adult medical examination with abnormal findings: Secondary | ICD-10-CM | POA: Diagnosis not present

## 2023-08-14 DIAGNOSIS — D6949 Other primary thrombocytopenia: Secondary | ICD-10-CM | POA: Diagnosis not present

## 2023-08-14 DIAGNOSIS — E1165 Type 2 diabetes mellitus with hyperglycemia: Secondary | ICD-10-CM | POA: Diagnosis not present

## 2023-08-25 ENCOUNTER — Ambulatory Visit: Payer: BC Managed Care – PPO | Admitting: Nurse Practitioner

## 2023-08-25 DIAGNOSIS — E1165 Type 2 diabetes mellitus with hyperglycemia: Secondary | ICD-10-CM

## 2023-08-25 DIAGNOSIS — Z794 Long term (current) use of insulin: Secondary | ICD-10-CM

## 2023-08-25 DIAGNOSIS — Z85858 Personal history of malignant neoplasm of other endocrine glands: Secondary | ICD-10-CM

## 2023-08-25 DIAGNOSIS — E039 Hypothyroidism, unspecified: Secondary | ICD-10-CM

## 2023-08-25 DIAGNOSIS — E890 Postprocedural hypothyroidism: Secondary | ICD-10-CM

## 2023-08-25 DIAGNOSIS — Z85850 Personal history of malignant neoplasm of thyroid: Secondary | ICD-10-CM

## 2023-08-25 DIAGNOSIS — Z7985 Long-term (current) use of injectable non-insulin antidiabetic drugs: Secondary | ICD-10-CM

## 2023-08-25 NOTE — Telephone Encounter (Signed)
Patient needs to reschedule appt. Can you help with this?

## 2023-09-07 ENCOUNTER — Ambulatory Visit: Payer: Medicare Other | Admitting: Obstetrics & Gynecology

## 2023-09-15 ENCOUNTER — Ambulatory Visit (HOSPITAL_COMMUNITY)
Admission: RE | Admit: 2023-09-15 | Discharge: 2023-09-15 | Disposition: A | Discharge status: Home / Self Care | Payer: BC Managed Care – PPO | Source: Ambulatory Visit | Attending: Neurosurgery | Admitting: Neurosurgery

## 2023-09-15 DIAGNOSIS — R1084 Generalized abdominal pain: Secondary | ICD-10-CM | POA: Diagnosis not present

## 2023-09-15 MED ORDER — IOHEXOL 300 MG/ML  SOLN
100.0000 mL | Freq: Once | INTRAMUSCULAR | Status: AC | PRN
  Administered 2023-09-15: 100 mL via INTRAVENOUS

## 2023-09-16 ENCOUNTER — Other Ambulatory Visit: Payer: Self-pay | Admitting: Nurse Practitioner

## 2023-09-24 ENCOUNTER — Other Ambulatory Visit: Payer: Self-pay | Admitting: Nurse Practitioner

## 2023-09-26 ENCOUNTER — Encounter: Payer: Self-pay | Admitting: Obstetrics & Gynecology

## 2023-09-26 ENCOUNTER — Ambulatory Visit (INDEPENDENT_AMBULATORY_CARE_PROVIDER_SITE_OTHER): Payer: BC Managed Care – PPO | Admitting: Nurse Practitioner

## 2023-09-26 ENCOUNTER — Encounter: Payer: Self-pay | Admitting: Nurse Practitioner

## 2023-09-26 ENCOUNTER — Other Ambulatory Visit: Payer: Self-pay | Admitting: Nurse Practitioner

## 2023-09-26 VITALS — BP 112/52 | HR 68 | Ht 64.5 in | Wt 215.0 lb

## 2023-09-26 DIAGNOSIS — E559 Vitamin D deficiency, unspecified: Secondary | ICD-10-CM

## 2023-09-26 DIAGNOSIS — Z794 Long term (current) use of insulin: Secondary | ICD-10-CM | POA: Diagnosis not present

## 2023-09-26 DIAGNOSIS — Z85850 Personal history of malignant neoplasm of thyroid: Secondary | ICD-10-CM

## 2023-09-26 DIAGNOSIS — I10 Essential (primary) hypertension: Secondary | ICD-10-CM

## 2023-09-26 DIAGNOSIS — E785 Hyperlipidemia, unspecified: Secondary | ICD-10-CM

## 2023-09-26 DIAGNOSIS — Z85858 Personal history of malignant neoplasm of other endocrine glands: Secondary | ICD-10-CM

## 2023-09-26 DIAGNOSIS — Z7984 Long term (current) use of oral hypoglycemic drugs: Secondary | ICD-10-CM | POA: Diagnosis not present

## 2023-09-26 DIAGNOSIS — E890 Postprocedural hypothyroidism: Secondary | ICD-10-CM | POA: Diagnosis not present

## 2023-09-26 DIAGNOSIS — E1165 Type 2 diabetes mellitus with hyperglycemia: Secondary | ICD-10-CM

## 2023-09-26 LAB — POCT GLYCOSYLATED HEMOGLOBIN (HGB A1C): HbA1c, POC (controlled diabetic range): 6.8 % (ref 0.0–7.0)

## 2023-09-26 MED ORDER — NOVOLOG FLEXPEN 100 UNIT/ML ~~LOC~~ SOPN: 5.0000 [IU] | PEN_INJECTOR | Freq: Three times a day (TID) | SUBCUTANEOUS | 3 refills | Status: DC

## 2023-09-26 MED ORDER — TOUJEO MAX SOLOSTAR 300 UNIT/ML ~~LOC~~ SOPN: 55.0000 [IU] | PEN_INJECTOR | Freq: Every day | SUBCUTANEOUS | 3 refills | Status: DC

## 2023-09-26 MED ORDER — LEVOTHYROXINE SODIUM 175 MCG PO TABS: 175.0000 ug | ORAL_TABLET | Freq: Every day | ORAL | 1 refills | Status: DC

## 2023-09-26 MED ORDER — BD PEN NEEDLE MINI U/F 31G X 5 MM MISC: 3 refills | Status: AC

## 2023-09-26 NOTE — Progress Notes (Signed)
Endocrinology Follow Up Note       09/26/2023, 8:48 AM   Subjective:    Patient ID: [name], female    DOB: 03-Apr-1971.  [name] is being seen in follow up after being seen in consultation for management of currently uncontrolled symptomatic diabetes requested by  Elfredia Nevins, MD.   Past Medical History:  Diagnosis Date   Anxiety    BV (bacterial vaginosis)    BV   Cancer (HCC) 2009   papillary thyroid cancer   Central vestibular vertigo    Depression    Diabetes mellitus without complication (HCC)    Family history of breast cancer    Hyperlipidemia    Hypertension    Hypothyroidism    IBS (irritable bowel syndrome)    Thyroid disease     Past Surgical History:  Procedure Laterality Date   ABLATION     CARPAL TUNNEL RELEASE     CARPAL TUNNEL RELEASE Right    CESAREAN SECTION     CYST EXCISION N/A 09/12/2022   Procedure: CYST REMOVAL, NECK;  Surgeon: Lucretia Roers, MD;  Location: [facility];  Service: General;  Laterality: N/A;   LIPOMA EXCISION Left 09/12/2022   Procedure: EXCISION LIPOMA, THIGH;  Surgeon: Lucretia Roers, MD;  Location: [facility];  Service: General;  Laterality: Left;   NECK AND CHEST LESION     THYROIDECTOMY     Right and Left, 05/2008, 09/2008   WISDOM TOOTH EXTRACTION      Social History   Socioeconomic History   Marital status: Married    Spouse name: Not on file   Number of children: Not on file   Years of education: Not on file   Highest education level: Not on file  Occupational History   Not on file  Tobacco Use   Smoking status: Former   Smokeless tobacco: Never  Vaping Use   Vaping status: Never Used  Substance and Sexual Activity   Alcohol use: Yes    Comment: occ   Drug use: Never   Sexual activity: Yes    Comment: ablation  Other Topics Concern   Not on file  Social History Narrative   Not on file   Social Determinants of Health    Financial Resource Strain: Not on file  Food Insecurity: Not on file  Transportation Needs: Not on file  Physical Activity: Not on file  Stress: Not on file  Social Connections: Not on file    Family History  Problem Relation Age of Onset   Breast cancer Mother        died at age 15   Mental illness Mother    Hypertension Father    Hyperlipidemia Father    Diabetes Father    Breast cancer Maternal Grandmother        died in her late 95s   Lung cancer Maternal Uncle     Outpatient Encounter Medications as of 09/26/2023  Medication Sig   ALPRAZolam (XANAX) 0.5 MG tablet Take 0.5 mg by mouth 2 (two) times daily as needed for anxiety.   atorvastatin (LIPITOR) 20 MG tablet Take 20 mg by mouth daily.   B-D UF  III MINI PEN NEEDLES 31G X 5 MM MISC USE AND DISCARD 1 PEN      NEEDLE IN THE MORNING, AT   NOON, IN THE EVENING, AND  AT BEDTIME   B-D UF III MINI PEN NEEDLES 31G X 5 MM MISC USE AND DISCARD 1 PEN      NEEDLE 3 TIMES A DAY FOR   SUBCUTANEOUS INJECTION   blood glucose meter kit and supplies KIT Dispense based on patient and insurance preference. Use up to four times daily as directed.   butalbital-acetaminophen-caffeine (FIORICET) 50-325-40 MG tablet Take 1 tablet by mouth 2 (two) times daily as needed for headache.   diazepam (VALIUM) 2 MG tablet Take 2 mg by mouth 2 (two) times daily as needed for anxiety.   diphenhydrAMINE (BENADRYL) 25 MG tablet Take 25 mg by mouth daily as needed for allergies.   DULoxetine (CYMBALTA) 30 MG capsule Take 30 mg by mouth 2 (two) times daily.   fenofibrate micronized (LOFIBRA) 200 MG capsule Take 200 mg by mouth daily before breakfast.   fluconazole (DIFLUCAN) 100 MG tablet Take 100 mg by mouth daily as needed (Yeast).   gabapentin (NEURONTIN) 800 MG tablet Take 800 mg by mouth 4 (four) times daily.   glimepiride (AMARYL) 4 MG tablet Take 1 tablet (4 mg total) by mouth daily.   [DISCONTINUED] B-D UF III MINI PEN NEEDLES 31G X 5 MM MISC USE AND  DISCARD 1 PEN      NEEDLE FOR SUBCUTANEOUS    INJECTION 3 TIMES A DAY   B-D UF III MINI PEN NEEDLES 31G X 5 MM MISC Use to inject insulin 4 times daily   HYDROcodone-acetaminophen (NORCO) 7.5-325 MG tablet Take 1 tablet by mouth every 8 (eight) hours as needed for severe pain.   hydrOXYzine (ATARAX/VISTARIL) 25 MG tablet Take 25 mg by mouth every 4 (four) hours as needed.   insulin aspart (NOVOLOG FLEXPEN) 100 UNIT/ML FlexPen Inject 5-11 Units into the skin 3 (three) times daily with meals.   insulin glargine, 2 Unit Dial, (TOUJEO MAX SOLOSTAR) 300 UNIT/ML Solostar Pen Inject 55 Units into the skin at bedtime.   Lancets (ONETOUCH DELICA PLUS LANCET33G) MISC Apply 1 each topically 4 (four) times daily.   levothyroxine (SYNTHROID) 175 MCG tablet Take 1 tablet (175 mcg total) by mouth daily.   lisinopril (ZESTRIL) 2.5 MG tablet Take 2.5 mg by mouth daily.   meclizine (ANTIVERT) 25 MG tablet Take 25 mg by mouth 4 (four) times daily as needed for dizziness.   Melatonin 10 MG TABS Take 10 mg by mouth at bedtime as needed (sleep).   meloxicam (MOBIC) 7.5 MG tablet Take 7.5 mg by mouth 2 (two) times daily as needed for pain.   ONETOUCH VERIO test strip 1 each 4 (four) times daily.   scopolamine (TRANSDERM-SCOP) 1 MG/3DAYS Place 1 patch onto the skin See admin instructions. As needed   traMADol (ULTRAM) 50 MG tablet Take 100 mg by mouth 3 (three) times daily as needed for moderate pain.   Vitamin D, Ergocalciferol, (DRISDOL) 1.25 MG (50000 UNIT) CAPS capsule Take 50,000 Units by mouth every Wednesday.   [DISCONTINUED] Continuous Blood Gluc Transmit (DEXCOM G6 TRANSMITTER) MISC CHANGE TRANSMITTER EVERY 90DAYS AS DIRECTED (Patient not taking: Reported on 09/26/2023)   [DISCONTINUED] Continuous Glucose Sensor (DEXCOM G6 SENSOR) MISC CHANGE SENSOR EVERY 10 DAYSAS DIRECTED (Patient not taking: Reported on 09/26/2023)   [DISCONTINUED] insulin aspart (NOVOLOG FLEXPEN) 100 UNIT/ML FlexPen Inject 15-21 Units into  the skin 3 (three)  times daily with meals. (Patient taking differently: Inject 5 Units into the skin 3 (three) times daily with meals.)   [DISCONTINUED] insulin glargine, 2 Unit Dial, (TOUJEO MAX SOLOSTAR) 300 UNIT/ML Solostar Pen Inject 80 Units into the skin at bedtime. (Patient taking differently: Inject 55 Units into the skin at bedtime.)   [DISCONTINUED] SYNTHROID 175 MCG tablet TAKE 1 TABLET DAILY   No facility-administered encounter medications on file as of 09/26/2023.    ALLERGIES: Allergies  Allergen Reactions   Aspirin     Upset stomach    Sulfa Antibiotics Hives   Sulfamethoxazole-Trimethoprim Rash    VACCINATION STATUS:  There is no immunization history on file for this patient.  Diabetes She presents for her follow-up diabetic visit. She has type 2 diabetes mellitus. Onset time: had never been formally diagnosed- has gest DM with both children- started on meds about 1 year ago at age of 83. Her disease course has been improving. Hypoglycemia symptoms include nervousness/anxiousness, sweats and tremors. Associated symptoms include weight loss. Pertinent negatives for diabetes include no blurred vision, no fatigue, no foot paresthesias, no polydipsia and no polyuria. Hypoglycemia complications include nocturnal hypoglycemia. Symptoms are improving. Diabetic complications include nephropathy and peripheral neuropathy. Risk factors for coronary artery disease include diabetes mellitus, dyslipidemia, family history, obesity, hypertension and sedentary lifestyle. Current diabetic treatment includes oral agent (monotherapy) and intensive insulin program. She is compliant with treatment most of the time. Her weight is decreasing steadily. She is following a generally healthy diet. When asked about meal planning, she reported none. She has not had a previous visit with a dietitian. She rarely participates in exercise. Her home blood glucose trend is decreasing steadily. Her overall blood  glucose range is 130-140 mg/dl. (She presents today, accompanied by her daughter, with her CGM showing mostly at goal glycemic profile.  Her POCT A1c today is 6.8%, improving from last visit of 8.6%.  Analysis of her CGM shows TIR 86%, TAR 13%, TBR <2% with a GMI of 6.8%.  She notes she still has good and bad days with her diet but she feels better overall.  She called between visits for low glucose readings and her insuline was reduced.  She got a puppy recently which is keeping her more active.) An ACE inhibitor/angiotensin II receptor blocker is being taken. She does not see a podiatrist.Eye exam is current.     Review of systems  Constitutional: + steadily decreasing body weight,  current Body mass index is 36.33 kg/m. , + fatigue, no subjective hyperthermia, no subjective hypothermia Eyes: no blurry vision, no xerophthalmia ENT: no sore throat, no dysphagia/odynophagia, no hoarseness Cardiovascular: no chest pain, no shortness of breath, no palpitations, no leg swelling Respiratory: no cough, no shortness of breath Gastrointestinal: no nausea/vomiting/diarrhea Musculoskeletal: no muscle/joint aches Skin: no rashes, no hyperemia Neurological: no tremors, no numbness, no tingling, intermittent dizziness- hx of orthostatic hypotension Psychiatric: no depression, no anxiety  Objective:     BP (!) 112/52   Pulse 68   Ht 5' 4.5" (1.638 m)   Wt 215 lb (97.5 kg)   BMI 36.33 kg/m   Wt Readings from Last 3 Encounters:  09/26/23 215 lb (97.5 kg)  07/21/23 220 lb (99.8 kg)  03/23/23 225 lb 9.6 oz (102.3 kg)     BP Readings from Last 3 Encounters:  09/26/23 (!) 112/52  07/21/23 (!) 115/53  03/23/23 116/70      Physical Exam- Limited  Constitutional:  Body mass index is 36.33 kg/m. , not  in acute distress, normal state of mind Eyes:  EOMI, no exophthalmos Musculoskeletal: no gross deformities, strength intact in all four extremities, no gross restriction of joint movements Skin:   no rashes, no hyperemia Neurological: no tremor with outstretched hands   Diabetic Foot Exam - Simple   No data filed      CMP ( most recent) CMP     Component Value Date/Time   NA 139 08/14/2023 0841   K 4.7 08/14/2023 0841   CL 105 08/14/2023 0841   CO2 21 08/14/2023 0841   GLUCOSE 183 (H) 08/14/2023 0841   GLUCOSE 220 (H) 09/08/2022 0957   BUN 22 08/14/2023 0841   CREATININE 1.27 (H) 08/14/2023 0841   CALCIUM 9.3 08/14/2023 0841   PROT 6.8 08/14/2023 0841   ALBUMIN 4.5 08/14/2023 0841   ALBUMIN 30 mg/L 03/23/2023 1553   AST 20 08/14/2023 0841   ALT 45 (H) 08/14/2023 0841   ALKPHOS 38 (L) 08/14/2023 0841   BILITOT <0.2 08/14/2023 0841   GFRNONAA >60 09/08/2022 0957   GFRAA >60 05/18/2011 0856     Diabetic Labs (most recent): Lab Results  Component Value Date   HGBA1C 6.8 09/26/2023   HGBA1C 8.6 (A) 03/23/2023   HGBA1C 9.7 (H) 09/08/2022   MICROALBUR 10 12/24/2021     Lipid Panel ( most recent) Lipid Panel     Component Value Date/Time   CHOL 161 08/14/2023 0841   TRIG 335 (H) 08/14/2023 0841   HDL 34 (L) 08/14/2023 0841   CHOLHDL 4.7 (H) 08/14/2023 0841   LDLCALC 73 08/14/2023 0841   LABVLDL 54 (H) 08/14/2023 0841      Lab Results  Component Value Date   TSH 5.630 (H) 08/14/2023   TSH 18.400 (H) 01/19/2023   TSH 3.630 01/07/2022   TSH 2.639 10/18/2013   FREET4 1.46 08/14/2023   FREET4 1.22 01/19/2023   FREET4 1.20 01/07/2022       Thyroid US from 05/20/22 CLINICAL DATA:  History of papillary thyroid cancer, status post total thyroidectomy   EXAM: THYROID ULTRASOUND   TECHNIQUE: Ultrasound examination of the thyroid gland and adjacent soft tissues was performed.   COMPARISON:  01/09/2008   FINDINGS: Patient is status post total thyroidectomy. No thyroid bed abnormality, nodule, or residual thyroid tissue appreciated.   However, in the right superior neck in the submandibular region, there is a superficial solid hypoechoic nodule  measuring 1.7 x 1.5 x 1.3 cm, concerning for a pathologic enlarged abnormal lymph node. Consider further evaluation with neck CT with contrast. This would be amenable to ultrasound core biopsy. Additional left neck benign lymph nodes are noted for comparison.   No additional neck mass bulky adenopathy by ultrasound.   IMPRESSION: Remote total thyroidectomy.   Right submandibular region 1.7 cm indeterminate soft tissue mass concerning for a pathologic enlarged lymph node. See above comment and recommendation.   These results will be called to the ordering clinician or representative by the Radiologist Assistant, and communication documented in the PACS or [employer].   The above is in keeping with the ACR TI-RADS recommendations - J Am Coll Radiol 2017;14:587-595.     Electronically Signed   By: Judie Petit.  Shick M.D.   On: 05/20/2022 11:42   Latest Reference Range & Units 10/18/13 11:13 01/07/22 10:17 01/19/23 14:49 08/14/23 08:41  TSH 0.450 - 4.500 uIU/mL 2.639 3.630 18.400 (H) 5.630 (H)  T4,Free(Direct) 0.82 - 1.77 ng/dL  3.66 4.40 3.47  (H): Data is abnormally high  Assessment &  Plan:   1) Uncontrolled Type 2 Diabetes with hyperglycemia  She presents today, accompanied by her daughter, with her CGM showing mostly at goal glycemic profile.  Her POCT A1c today is 6.8%, improving from last visit of 8.6%.  Analysis of her CGM shows TIR 86%, TAR 13%, TBR <2% with a GMI of 6.8%.  She notes she still has good and bad days with her diet but she feels better overall.  She called between visits for low glucose readings and her insuline was reduced.  She got a puppy recently which is keeping her more active.  - [name] has currently uncontrolled symptomatic type 2 DM since 52 years of age.   -Recent labs reviewed.  - I had a long discussion with her about the progressive nature of diabetes and the pathology behind its complications. -her diabetes is complicated by neuropathy  and she remains at a high risk for more acute and chronic complications which include CAD, CVA, CKD, retinopathy, and neuropathy. These are all discussed in detail with her.  - Nutritional counseling repeated at each appointment due to patients tendency to fall back in to old habits.  - The patient admits there is a room for improvement in their diet and drink choices. -  Suggestion is made for the patient to avoid simple carbohydrates from their diet including Cakes, Sweet Desserts / Pastries, Ice Cream, Soda (diet and regular), Sweet Tea, Candies, Chips, Cookies, Sweet Pastries, Store Bought Juices, Alcohol in Excess of 1-2 drinks a day, Artificial Sweeteners, Coffee Creamer, and "Sugar-free" Products. This will help patient to have stable blood glucose profile and potentially avoid unintended weight gain.   - I encouraged the patient to switch to unprocessed or minimally processed complex starch and increased protein intake (animal or plant source), fruits, and vegetables.   - Patient is advised to stick to a routine mealtimes to eat 3 meals a day and avoid unnecessary snacks (to snack only to correct hypoglycemia).  - she will be scheduled with Norm Salt, RDN, CDE for diabetes education.  Has appt coming up next week.  - I have approached her with the following individualized plan to manage her diabetes and patient agrees:   -She is advised to continue Toujeo 55 units SQ nightly and Novolog to 5-11 units TID with meals if glucose is above 90 and she is eating (Specific instructions on how to titrate insulin dosage based on glucose readings given to patient in writing).  She can also continue her Glimepiride 4 mg po daily with breakfast.    -she is encouraged to continue monitoring blood glucose 4 times daily (using her CGM), before meals and before bed, and to call the clinic if she has readings less than 70 or above 300 for 3 tests in a row.    - she is warned not to take insulin without  proper monitoring per orders. - Adjustment parameters are given to her for hypo and hyperglycemia in writing.  - she is not an ideal candidate for GLP1 due to history of papillary thyroid cancer, s/p total thyroidectomy.  It is too much of a risk to even consider using it.  - Specific targets for  A1c; LDL, HDL, and Triglycerides were discussed with the patient.  2) Blood Pressure /Hypertension:  her blood pressure is controlled to target.   she is advised to continue her current medications including Lisinopril 2.5 mg p.o. daily with breakfast.  3) Lipids/Hyperlipidemia:    Recent lipid panel from  08/14/23 shows controlled LDL of 73 and elevated triglycerides of 335.  she is advised to continue Lipitor 10 mg daily at bedtime and Fenofibrate 200 mg po daily.  Side effects and precautions discussed with her.    4)  Weight/Diet:  her Body mass index is 36.33 kg/m.  -  clearly complicating her diabetes care.   she is a candidate for weight loss. I discussed with her the fact that loss of 5 - 10% of her  current body weight will have the most impact on her diabetes management.  Exercise, and detailed carbohydrates information provided  -  detailed on discharge instructions.  5) Hypothyroidism- r/t total thyroidectomy for thyroid cancer -Her previsit TFTs show improved TSH, still not at goal but improving and normal FT4.  She is currently on Levothyroxine 175 mcg po daily before breakfast, she is advised to continue this for now.    - The correct intake of thyroid hormone (Levothyroxine, Synthroid), is on empty stomach first thing in the morning, with water, separated by at least 30 minutes from breakfast and other medications,  and separated by more than 4 hours from calcium, iron, multivitamins, acid reflux medications (PPIs).  - This medication is a life-long medication and will be needed to correct thyroid hormone imbalances for the rest of your life.  The dose may change from time to time, based  on thyroid blood work.  - It is extremely important to be consistent taking this medication, near the same time each morning.  -AVOID TAKING PRODUCTS CONTAINING BIOTIN (commonly found in Hair, Skin, Nails vitamins) AS IT INTERFERES WITH THE VALIDITY OF THYROID FUNCTION BLOOD TESTS.  6) Vitamin D deficiency Her most recent vitamin D level was 9 on 08/05/21.  She is currently on Ergocalciferol 50000 units weekly.    7) Chronic Care/Health Maintenance: -she is on ACEI/ARB and Statin medications and is encouraged to initiate and continue to follow up with Ophthalmology, Dentist, Podiatrist at least yearly or according to recommendations, and advised to stay away from smoking. I have recommended yearly flu vaccine and pneumonia vaccine at least every 5 years; moderate intensity exercise for up to 150 minutes weekly; and sleep for at least 7 hours a day.  - she is advised to maintain close follow up with Elfredia Nevins, MD for primary care needs, as well as her other providers for optimal and coordinated care.     I spent  44  minutes in the care of the patient today including review of labs from CMP, Lipids, Thyroid Function, Hematology (current and previous including abstractions from other facilities); face-to-face time discussing  her blood glucose readings/logs, discussing hypoglycemia and hyperglycemia episodes and symptoms, medications doses, her options of short and long term treatment based on the latest standards of care / guidelines;  discussion about incorporating lifestyle medicine;  and documenting the encounter. Risk reduction counseling performed per USPSTF guidelines to reduce obesity and cardiovascular risk factors.     Please refer to Patient Instructions for Blood Glucose Monitoring and Insulin/Medications Dosing Guide"  in media tab for additional information. Please  also refer to " Patient Self Inventory" in the Media  tab for reviewed elements of pertinent patient  history.  Alben Spittle participated in the discussions, expressed understanding, and voiced agreement with the above plans.  All questions were answered to her satisfaction. she is encouraged to contact clinic should she have any questions or concerns prior to her return visit.     Follow up plan: -  Return in about 3 months (around 12/27/2023) for Diabetes F/U with A1c in office, Previsit labs, Bring meter and logs.   Ronny Bacon, [facility] [address] [place], [place] [address] Phone: [phone] Fax: [phone]  09/26/2023, 8:48 AM

## 2023-09-26 NOTE — Telephone Encounter (Signed)
We can prescribe at 54 units

## 2023-09-27 ENCOUNTER — Ambulatory Visit: Payer: Medicare Other | Admitting: Obstetrics & Gynecology

## 2023-09-27 NOTE — Telephone Encounter (Signed)
Pt is using Dexcom G7 CGM system

## 2023-10-07 ENCOUNTER — Other Ambulatory Visit: Payer: Self-pay | Admitting: "Endocrinology

## 2023-10-15 ENCOUNTER — Encounter (INDEPENDENT_AMBULATORY_CARE_PROVIDER_SITE_OTHER): Payer: Self-pay

## 2023-10-20 ENCOUNTER — Other Ambulatory Visit (HOSPITAL_COMMUNITY)
Admission: RE | Admit: 2023-10-20 | Discharge: 2023-10-20 | Disposition: A | Discharge status: Home / Self Care | Payer: BC Managed Care – PPO | Source: Ambulatory Visit | Attending: Obstetrics & Gynecology | Admitting: Obstetrics & Gynecology

## 2023-10-20 ENCOUNTER — Encounter: Payer: Self-pay | Admitting: Adult Health

## 2023-10-20 ENCOUNTER — Ambulatory Visit: Payer: BC Managed Care – PPO | Admitting: Adult Health

## 2023-10-20 VITALS — BP 127/53 | HR 64 | Ht 64.5 in | Wt 216.0 lb

## 2023-10-20 DIAGNOSIS — N3946 Mixed incontinence: Secondary | ICD-10-CM

## 2023-10-20 DIAGNOSIS — R232 Flushing: Secondary | ICD-10-CM

## 2023-10-20 DIAGNOSIS — Z1211 Encounter for screening for malignant neoplasm of colon: Secondary | ICD-10-CM | POA: Diagnosis not present

## 2023-10-20 DIAGNOSIS — N816 Rectocele: Secondary | ICD-10-CM

## 2023-10-20 DIAGNOSIS — Z01419 Encounter for gynecological examination (general) (routine) without abnormal findings: Secondary | ICD-10-CM | POA: Diagnosis not present

## 2023-10-20 DIAGNOSIS — Z1151 Encounter for screening for human papillomavirus (HPV): Secondary | ICD-10-CM | POA: Insufficient documentation

## 2023-10-20 DIAGNOSIS — Z1331 Encounter for screening for depression: Secondary | ICD-10-CM | POA: Diagnosis not present

## 2023-10-20 DIAGNOSIS — R6882 Decreased libido: Secondary | ICD-10-CM | POA: Diagnosis not present

## 2023-10-20 DIAGNOSIS — Z0000 Encounter for general adult medical examination without abnormal findings: Secondary | ICD-10-CM

## 2023-10-20 DIAGNOSIS — N812 Incomplete uterovaginal prolapse: Secondary | ICD-10-CM | POA: Insufficient documentation

## 2023-10-20 LAB — HEMOCCULT GUIAC POC 1CARD (OFFICE): Fecal Occult Blood, POC: NEGATIVE

## 2023-10-20 NOTE — Progress Notes (Signed)
Patient ID: DYNESHA ZELMER, female   DOB: 20-Aug-1971, 52 y.o.   MRN: [id number] History of Present Illness: Mishael is a 52 year old white female, married, G2P2002 in for a well woman gyn exam and pap. She was last seen in 2019. She has not worked in 3 years is on DB.  PCP is Dr Sherwood Gambler.   Current Medications, Allergies, Past Medical History, Past Surgical History, Family History and Social History were reviewed in [employer] electronic medical record.     Review of Systems: Patient denies any headaches, hearing loss, fatigue, blurred vision, shortness of breath, chest pain, abdominal pain, problems with bowel movements.  No joint pain or mood swings.  She has mixed UI at times, decreased libido, and hot flashes. She had an ablation, denies any vaginal bleeding  She has dizziness and anxiety   Physical Exam:BP (!) 127/53 (BP Location: Left Arm, Patient Position: Sitting, Cuff Size: Normal)   Pulse 64   Ht 5' 4.5" (1.638 m)   Wt 216 lb (98 kg)   BMI 36.50 kg/m   General:  Well developed, well nourished, no acute distress Skin:  Warm and dry Neck:  Midline trachea, surgically absent thyroid, good ROM, no lymphadenopathy Lungs; Clear to auscultation bilaterally Breast:  No dominant palpable mass, retraction, or nipple discharge Cardiovascular: Regular rate and rhythm Abdomen:  Soft, non tender, no hepatosplenomegaly Pelvic:  External genitalia is normal in appearance, no lesions.  The vagina is pale, has uterine descensus. Urethra has no lesions or masses. The cervix is bulbous, and smooth, pap with HR HPV genotyping performed.  Uterus is felt to be normal size, shape, and contour.  No adnexal masses or tenderness noted.Bladder is mildly tender, no masses felt. Rectal: Good sphincter tone, no polyps, or hemorrhoids felt.  Hemoccult negative.+rectocele Extremities/musculoskeletal:  No swelling or varicosities noted, no clubbing or cyanosis Psych:  No mood changes, alert and  cooperative,seems happy AA is 0 Fall risk is moderate    10/20/2023    8:55 AM 03/01/2022    1:39 PM 10/26/2018   12:49 PM  Depression screen PHQ 2/9  Decreased Interest 2 1 0  Down, Depressed, Hopeless 2 1 0  PHQ - 2 Score 4 2 0  Altered sleeping 1 3   Tired, decreased energy 2 2   Change in appetite 2 3   Feeling bad or failure about yourself  1 3   Trouble concentrating 1 3   Moving slowly or fidgety/restless 1 1   Suicidal thoughts 0 0   PHQ-9 Score 12 17   Difficult doing work/chores  Very difficult    She is on meds     10/20/2023    8:55 AM  GAD 7 : Generalized Anxiety Score  Nervous, Anxious, on Edge 2  Control/stop worrying 2  Worry too much - different things 2  Trouble relaxing 1  Restless 1  Easily annoyed or irritable 2  Afraid - awful might happen 0  Total GAD 7 Score 10      Upstream - 10/20/23 0856       Pregnancy Intention Screening   Does the patient want to become pregnant in the next year? No    Does the patient's partner want to become pregnant in the next year? No    Would the patient like to discuss contraceptive options today? No      Contraception Wrap Up   Current Method No Contraceptive Precautions   ablation   End Method No Contraception  Precautions   ablation   Contraception Counseling Provided No             Examination chaperoned by Faith Rogue LPN  Impression and Plan: 1. Encounter for gynecological examination with Papanicolaou smear of cervix Pap sent Pap in 3 years if normal Physical with PCP Labs with PCP Mammogram was negative 06/05/23 She had negtive cologuard 2023 - Cytology - PAP( [place]) Try to be more active in the house  2. Encounter for screening fecal occult blood testing Hemoccult was negative  - POCT occult blood stool  3. Hot flashes Use fans, decrease in layers If increases, call to discuss further  4. Decreased libido Try to use good lubricate and increase frequency of sex  5. Mixed  stress and urge urinary incontinence Continue to work on weight loss, has lost 15 lbs Do kegels, see handout Decrease caffeine and try to void often  6. First degree uterine prolapse  7. Rectocele

## 2023-10-23 LAB — CYTOLOGY - PAP
Chlamydia: NEGATIVE
Comment: NEGATIVE
Comment: NEGATIVE
Comment: NORMAL
Diagnosis: NEGATIVE
High risk HPV: NEGATIVE
Neisseria Gonorrhea: NEGATIVE

## 2023-10-25 ENCOUNTER — Encounter: Payer: Self-pay | Admitting: Adult Health

## 2023-10-26 ENCOUNTER — Ambulatory Visit: Payer: Medicare Other | Admitting: Obstetrics & Gynecology

## 2023-11-23 DIAGNOSIS — S233XXA Sprain of ligaments of thoracic spine, initial encounter: Secondary | ICD-10-CM | POA: Diagnosis not present

## 2023-11-23 DIAGNOSIS — S134XXA Sprain of ligaments of cervical spine, initial encounter: Secondary | ICD-10-CM | POA: Diagnosis not present

## 2023-11-23 DIAGNOSIS — S338XXA Sprain of other parts of lumbar spine and pelvis, initial encounter: Secondary | ICD-10-CM | POA: Diagnosis not present

## 2023-12-01 DIAGNOSIS — S338XXA Sprain of other parts of lumbar spine and pelvis, initial encounter: Secondary | ICD-10-CM | POA: Diagnosis not present

## 2023-12-01 DIAGNOSIS — S233XXA Sprain of ligaments of thoracic spine, initial encounter: Secondary | ICD-10-CM | POA: Diagnosis not present

## 2023-12-01 DIAGNOSIS — S134XXA Sprain of ligaments of cervical spine, initial encounter: Secondary | ICD-10-CM | POA: Diagnosis not present

## 2023-12-07 DIAGNOSIS — S233XXA Sprain of ligaments of thoracic spine, initial encounter: Secondary | ICD-10-CM | POA: Diagnosis not present

## 2023-12-07 DIAGNOSIS — S338XXA Sprain of other parts of lumbar spine and pelvis, initial encounter: Secondary | ICD-10-CM | POA: Diagnosis not present

## 2023-12-07 DIAGNOSIS — S134XXA Sprain of ligaments of cervical spine, initial encounter: Secondary | ICD-10-CM | POA: Diagnosis not present

## 2023-12-14 DIAGNOSIS — S134XXA Sprain of ligaments of cervical spine, initial encounter: Secondary | ICD-10-CM | POA: Diagnosis not present

## 2023-12-14 DIAGNOSIS — S338XXA Sprain of other parts of lumbar spine and pelvis, initial encounter: Secondary | ICD-10-CM | POA: Diagnosis not present

## 2023-12-14 DIAGNOSIS — S233XXA Sprain of ligaments of thoracic spine, initial encounter: Secondary | ICD-10-CM | POA: Diagnosis not present

## 2023-12-19 DIAGNOSIS — S233XXA Sprain of ligaments of thoracic spine, initial encounter: Secondary | ICD-10-CM | POA: Diagnosis not present

## 2023-12-19 DIAGNOSIS — S338XXA Sprain of other parts of lumbar spine and pelvis, initial encounter: Secondary | ICD-10-CM | POA: Diagnosis not present

## 2023-12-19 DIAGNOSIS — S134XXA Sprain of ligaments of cervical spine, initial encounter: Secondary | ICD-10-CM | POA: Diagnosis not present

## 2023-12-19 DIAGNOSIS — G43001 Migraine without aura, not intractable, with status migrainosus: Secondary | ICD-10-CM | POA: Diagnosis not present

## 2023-12-21 ENCOUNTER — Ambulatory Visit: Payer: BC Managed Care – PPO | Attending: Internal Medicine | Admitting: Internal Medicine

## 2023-12-21 ENCOUNTER — Encounter: Payer: Self-pay | Admitting: Internal Medicine

## 2023-12-21 ENCOUNTER — Other Ambulatory Visit: Payer: Self-pay

## 2023-12-21 VITALS — BP 120/54 | HR 67 | Ht 64.5 in | Wt 217.6 lb

## 2023-12-21 DIAGNOSIS — Z136 Encounter for screening for cardiovascular disorders: Secondary | ICD-10-CM

## 2023-12-21 DIAGNOSIS — Z01810 Encounter for preprocedural cardiovascular examination: Secondary | ICD-10-CM

## 2023-12-21 DIAGNOSIS — S134XXA Sprain of ligaments of cervical spine, initial encounter: Secondary | ICD-10-CM | POA: Diagnosis not present

## 2023-12-21 DIAGNOSIS — R079 Chest pain, unspecified: Secondary | ICD-10-CM | POA: Insufficient documentation

## 2023-12-21 DIAGNOSIS — I209 Angina pectoris, unspecified: Secondary | ICD-10-CM

## 2023-12-21 DIAGNOSIS — G43001 Migraine without aura, not intractable, with status migrainosus: Secondary | ICD-10-CM | POA: Diagnosis not present

## 2023-12-21 DIAGNOSIS — S338XXA Sprain of other parts of lumbar spine and pelvis, initial encounter: Secondary | ICD-10-CM | POA: Diagnosis not present

## 2023-12-21 DIAGNOSIS — S233XXA Sprain of ligaments of thoracic spine, initial encounter: Secondary | ICD-10-CM | POA: Diagnosis not present

## 2023-12-21 MED ORDER — METOPROLOL TARTRATE 100 MG PO TABS: 100.0000 mg | ORAL_TABLET | Freq: Once | ORAL | 0 refills | Status: DC

## 2023-12-21 NOTE — Patient Instructions (Addendum)
Medication Instructions:  Your physician recommends that you continue on your current medications as directed. Please refer to the Current Medication list given to you today.   Labwork: BMET to be completed 1-2 weeks prior to CTA  Testing/Procedures:   Your cardiac CT will be scheduled at one of the below locations:   [facility] [address] [place], [place] [address] [phone]  If scheduled at [facility], please arrive at the [facility] and Children's Entrance (Entrance C2) of [facility] 30 minutes prior to test start time. You can use the FREE valet parking offered at entrance C (encouraged to control the heart rate for the test)  Proceed to the [facility] Radiology Department (first floor) to check-in and test prep.  All radiology patients and guests should use entrance C2 at [facility], accessed from [facility], even though the hospital's physical address listed is [address].    Please follow these instructions carefully (unless otherwise directed):  An IV will be required for this test and Nitroglycerin will be given.   On the Night Before the Test: Be sure to Drink plenty of water. Do not consume any caffeinated/decaffeinated beverages or chocolate 12 hours prior to your test. Do not take any antihistamines 12 hours prior to your test. If the patient has contrast allergy: No allergy  On the Day of the Test: Drink plenty of water until 1 hour prior to the test. Do not eat any food 1 hour prior to test. You may take your regular medications prior to the test.  Take metoprolol 100 mg(Lopressor) two hours prior to test. Patients who wear a continuous glucose monitor MUST remove the device prior to scanning. FEMALES- please wear underwire-free bra if available, avoid dresses & tight clothing      After the Test: Drink plenty of water. After receiving IV contrast, you may experience a mild flushed  feeling. This is normal. On occasion, you may experience a mild rash up to 24 hours after the test. This is not dangerous. If this occurs, you can take Benadryl 25 mg and increase your fluid intake. If you experience trouble breathing, this can be serious. If it is severe call 911 IMMEDIATELY. If it is mild, please call our office.  We will call to schedule your test 2-4 weeks out understanding that some insurance companies will need an authorization prior to the service being performed.   For more information and frequently asked questions, please visit our website : [email or web address]  For non-scheduling related questions, please contact the cardiac imaging nurse navigator should you have any questions/concerns: Cardiac Imaging Nurse Navigators Direct Office Dial: [phone]   For scheduling needs, including cancellations and rescheduling, please call [place], [phone].   Follow-Up: Your physician recommends that you schedule a follow-up appointment in:   Any Other Special Instructions Will Be Listed Below (If Applicable).  If you need a refill on your cardiac medications before your next appointment, please call your pharmacy.

## 2023-12-21 NOTE — Progress Notes (Signed)
Cardiology Office Note  Date: 12/21/2023   ID: [name], DOB 1971-11-19, MRN [id number]  PCP:  Elfredia Nevins, MD  Cardiologist:  None Electrophysiologist:  None   History of Present Illness: [name] is a 53 y.o. female known to have HTN, insulin-dependent diabetes mellitus type 2, autoimmune thyroiditis on thyroid supplements was referred to cardiology clinic for evaluation of chest pain.  Patient reported that she was told she has calcifications in her heart arteries on MRI scan.  I do not have the report to review.  She does report exertional chest pain lasting for minutes and occurring 2 times per week.  Resolves with rest.  Ongoing for many months.  Associated with SOB.  No exertional dizziness, syncope or palpitations.  No leg swelling.  Past Medical History:  Diagnosis Date   Anxiety    BV (bacterial vaginosis)    BV   Cancer (HCC) 2009   papillary thyroid cancer   Central vestibular vertigo    Depression    Diabetes mellitus without complication (HCC)    Family history of breast cancer    Hyperlipidemia    Hypertension    Hypothyroidism    IBS (irritable bowel syndrome)    Thyroid disease     Past Surgical History:  Procedure Laterality Date   ABLATION     CARPAL TUNNEL RELEASE     CARPAL TUNNEL RELEASE Right    CESAREAN SECTION     CYST EXCISION N/A 09/12/2022   Procedure: CYST REMOVAL, NECK;  Surgeon: Lucretia Roers, MD;  Location: [facility];  Service: General;  Laterality: N/A;   LIPOMA EXCISION Left 09/12/2022   Procedure: EXCISION LIPOMA, THIGH;  Surgeon: Lucretia Roers, MD;  Location: [facility];  Service: General;  Laterality: Left;   NECK AND CHEST LESION     THYROIDECTOMY     Right and Left, 05/2008, 09/2008   WISDOM TOOTH EXTRACTION      Current Outpatient Medications  Medication Sig Dispense Refill   ALPRAZolam (XANAX) 0.5 MG tablet Take 0.5 mg by mouth 2 (two) times daily as needed for anxiety.     atorvastatin (LIPITOR) 20 MG  tablet Take 20 mg by mouth daily.     B-D UF III MINI PEN NEEDLES 31G X 5 MM MISC USE AND DISCARD 1 PEN      NEEDLE IN THE MORNING, AT   NOON, IN THE EVENING, AND  AT BEDTIME 100 each 0   B-D UF III MINI PEN NEEDLES 31G X 5 MM MISC USE AND DISCARD 1 PEN      NEEDLE 3 TIMES A DAY FOR   SUBCUTANEOUS INJECTION 270 each 1   B-D UF III MINI PEN NEEDLES 31G X 5 MM MISC Use to inject insulin 4 times daily 300 each 3   blood glucose meter kit and supplies KIT Dispense based on patient and insurance preference. Use up to four times daily as directed. 1 each 0   butalbital-acetaminophen-caffeine (FIORICET) 50-325-40 MG tablet Take 1 tablet by mouth 2 (two) times daily as needed for headache.     diazepam (VALIUM) 2 MG tablet Take 2 mg by mouth 2 (two) times daily as needed for anxiety.     diphenhydrAMINE (BENADRYL) 25 MG tablet Take 25 mg by mouth daily as needed for allergies.     DULoxetine (CYMBALTA) 30 MG capsule Take 30 mg by mouth 2 (two) times daily.     fenofibrate micronized (LOFIBRA) 200 MG capsule Take 200  mg by mouth daily before breakfast.     fluconazole (DIFLUCAN) 100 MG tablet Take 100 mg by mouth daily as needed (Yeast).     gabapentin (NEURONTIN) 800 MG tablet Take 800 mg by mouth 4 (four) times daily.     glimepiride (AMARYL) 4 MG tablet Take 1 tablet (4 mg total) by mouth daily. 90 tablet 3   HYDROcodone-acetaminophen (NORCO) 7.5-325 MG tablet Take 1 tablet by mouth every 8 (eight) hours as needed for severe pain.     hydrOXYzine (ATARAX/VISTARIL) 25 MG tablet Take 25 mg by mouth every 4 (four) hours as needed.     insulin aspart (NOVOLOG FLEXPEN) 100 UNIT/ML FlexPen Inject 5-11 Units into the skin 3 (three) times daily with meals. 30 mL 3   insulin glargine, 2 Unit Dial, (TOUJEO MAX SOLOSTAR) 300 UNIT/ML Solostar Pen Inject 54 Units into the skin at bedtime. 18 mL 3   Lancets (ONETOUCH DELICA PLUS LANCET33G) MISC Apply 1 each topically 4 (four) times daily.     lisinopril (ZESTRIL) 2.5  MG tablet Take 2.5 mg by mouth daily.     meclizine (ANTIVERT) 25 MG tablet Take 25 mg by mouth 4 (four) times daily as needed for dizziness.     Melatonin 10 MG TABS Take 10 mg by mouth at bedtime as needed (sleep).     meloxicam (MOBIC) 7.5 MG tablet Take 7.5 mg by mouth 2 (two) times daily as needed for pain.     [START ON 12/22/2023] metoprolol tartrate (LOPRESSOR) 100 MG tablet Take 1 tablet (100 mg total) by mouth once for 1 dose. 2 hours prior to procedure 1 tablet 0   ONETOUCH VERIO test strip 1 each 4 (four) times daily.     scopolamine (TRANSDERM-SCOP) 1 MG/3DAYS Place 1 patch onto the skin See admin instructions. As needed     SYNTHROID 175 MCG tablet TAKE 1 TABLET DAILY 90 tablet 0   traMADol (ULTRAM) 50 MG tablet Take 100 mg by mouth 3 (three) times daily as needed for moderate pain.     Vitamin D, Ergocalciferol, (DRISDOL) 1.25 MG (50000 UNIT) CAPS capsule Take 50,000 Units by mouth daily. Takes OTC     No current facility-administered medications for this visit.   Allergies:  Aspirin, Sulfa antibiotics, and Sulfamethoxazole-trimethoprim   Social History: The patient  reports that she has quit smoking. She has never used smokeless tobacco. She reports current alcohol use. She reports that she does not use drugs.   Family History: The patient's family history includes Breast cancer in her maternal grandmother and mother; Diabetes in her father; Hyperlipidemia in her father; Hypertension in her father; Lung cancer in her maternal uncle; Mental illness in her mother.   ROS:  Please see the history of present illness. Otherwise, complete review of systems is positive for none  All other systems are reviewed and negative.   Physical Exam: VS:  BP (!) 120/54   Pulse 67   Ht 5' 4.5" (1.638 m)   Wt 217 lb 9.6 oz (98.7 kg)   SpO2 97%   BMI 36.77 kg/m , BMI Body mass index is 36.77 kg/m.  Wt Readings from Last 3 Encounters:  12/21/23 217 lb 9.6 oz (98.7 kg)  10/20/23 216 lb (98  kg)  09/26/23 215 lb (97.5 kg)    General: Patient appears comfortable at rest. HEENT: Conjunctiva and lids normal, oropharynx clear with moist mucosa. Neck: Supple, no elevated JVP or carotid bruits, no thyromegaly. Lungs: Clear to auscultation, nonlabored breathing  at rest. Cardiac: Regular rate and rhythm, no S3 or significant systolic murmur, no pericardial rub. Abdomen: Soft, nontender, no hepatomegaly, bowel sounds present, no guarding or rebound. Extremities: No pitting edema, distal pulses 2+. Skin: Warm and dry. Musculoskeletal: No kyphosis. Neuropsychiatric: Alert and oriented x3, affect grossly appropriate.  Recent Labwork: 08/14/2023: ALT 45; AST 20; BUN 22; Creatinine, Ser 1.27; Potassium 4.7; Sodium 139; TSH 5.630     Component Value Date/Time   CHOL 161 08/14/2023 0841   TRIG 335 (H) 08/14/2023 0841   HDL 34 (L) 08/14/2023 0841   CHOLHDL 4.7 (H) 08/14/2023 0841   LDLCALC 73 08/14/2023 0841     Assessment and Plan:   Chest pain: Ongoing exertional chest pain 2 times per week and lasting for minutes.  Resolves with rest.  She also has chest pains from panic attacks along with exertional chest pains.  Cardiac risk factors include HTN, insulin-dependent diabetes mellitus type 2.  Obtain CT cardiac.  HTN, controlled: Continue lisinopril 2.5 mg once daily.  HLD, at goal: Continue atorvastatin 20 mg nightly.  Goal LDL is less than 100.      Medication Adjustments/Labs and Tests Ordered: Current medicines are reviewed at length with the patient today.  Concerns regarding medicines are outlined above.    Disposition:  Follow up  pending results  Signed Roisin Mones Verne Spurr, MD, 12/21/2023 3:43 PM    [facility] at [facility] [address] [place], [place], [place] [address]

## 2023-12-22 DIAGNOSIS — M2550 Pain in unspecified joint: Secondary | ICD-10-CM | POA: Diagnosis not present

## 2023-12-22 DIAGNOSIS — E1165 Type 2 diabetes mellitus with hyperglycemia: Secondary | ICD-10-CM | POA: Diagnosis not present

## 2023-12-22 DIAGNOSIS — Z6834 Body mass index (BMI) 34.0-34.9, adult: Secondary | ICD-10-CM | POA: Diagnosis not present

## 2023-12-22 DIAGNOSIS — G43009 Migraine without aura, not intractable, without status migrainosus: Secondary | ICD-10-CM | POA: Diagnosis not present

## 2023-12-22 DIAGNOSIS — M159 Polyosteoarthritis, unspecified: Secondary | ICD-10-CM | POA: Diagnosis not present

## 2023-12-22 DIAGNOSIS — E063 Autoimmune thyroiditis: Secondary | ICD-10-CM | POA: Diagnosis not present

## 2023-12-22 DIAGNOSIS — E782 Mixed hyperlipidemia: Secondary | ICD-10-CM | POA: Diagnosis not present

## 2023-12-22 DIAGNOSIS — I10 Essential (primary) hypertension: Secondary | ICD-10-CM | POA: Diagnosis not present

## 2023-12-22 DIAGNOSIS — M545 Low back pain, unspecified: Secondary | ICD-10-CM | POA: Diagnosis not present

## 2023-12-22 DIAGNOSIS — E6609 Other obesity due to excess calories: Secondary | ICD-10-CM | POA: Diagnosis not present

## 2023-12-22 DIAGNOSIS — D6949 Other primary thrombocytopenia: Secondary | ICD-10-CM | POA: Diagnosis not present

## 2023-12-28 ENCOUNTER — Other Ambulatory Visit: Payer: Self-pay | Admitting: Nurse Practitioner

## 2023-12-28 DIAGNOSIS — S338XXA Sprain of other parts of lumbar spine and pelvis, initial encounter: Secondary | ICD-10-CM | POA: Diagnosis not present

## 2023-12-28 DIAGNOSIS — S233XXA Sprain of ligaments of thoracic spine, initial encounter: Secondary | ICD-10-CM | POA: Diagnosis not present

## 2023-12-28 DIAGNOSIS — S134XXA Sprain of ligaments of cervical spine, initial encounter: Secondary | ICD-10-CM | POA: Diagnosis not present

## 2023-12-28 DIAGNOSIS — G43001 Migraine without aura, not intractable, with status migrainosus: Secondary | ICD-10-CM | POA: Diagnosis not present

## 2023-12-29 ENCOUNTER — Ambulatory Visit: Payer: BC Managed Care – PPO | Admitting: Nurse Practitioner

## 2024-01-04 DIAGNOSIS — G43001 Migraine without aura, not intractable, with status migrainosus: Secondary | ICD-10-CM | POA: Diagnosis not present

## 2024-01-04 DIAGNOSIS — S134XXA Sprain of ligaments of cervical spine, initial encounter: Secondary | ICD-10-CM | POA: Diagnosis not present

## 2024-01-04 DIAGNOSIS — S338XXA Sprain of other parts of lumbar spine and pelvis, initial encounter: Secondary | ICD-10-CM | POA: Diagnosis not present

## 2024-01-04 DIAGNOSIS — S233XXA Sprain of ligaments of thoracic spine, initial encounter: Secondary | ICD-10-CM | POA: Diagnosis not present

## 2024-01-08 ENCOUNTER — Encounter: Payer: Self-pay | Admitting: Internal Medicine

## 2024-01-10 ENCOUNTER — Telehealth (HOSPITAL_COMMUNITY): Payer: Self-pay | Admitting: *Deleted

## 2024-01-10 NOTE — Telephone Encounter (Signed)
 Attempted to call patient regarding upcoming cardiac CT appointment. Left message on voicemail with name and callback number  Chantal Requena RN Navigator Cardiac Imaging [facility] [phone] Office [phone] Cell  Reminder to obtain labs prior to scan.

## 2024-01-12 ENCOUNTER — Ambulatory Visit (HOSPITAL_COMMUNITY): Admission: RE | Admit: 2024-01-12 | Payer: Medicare Other | Source: Ambulatory Visit

## 2024-01-18 DIAGNOSIS — L723 Sebaceous cyst: Secondary | ICD-10-CM | POA: Diagnosis not present

## 2024-01-18 DIAGNOSIS — E1165 Type 2 diabetes mellitus with hyperglycemia: Secondary | ICD-10-CM | POA: Diagnosis not present

## 2024-01-18 DIAGNOSIS — I10 Essential (primary) hypertension: Secondary | ICD-10-CM | POA: Diagnosis not present

## 2024-01-18 DIAGNOSIS — D485 Neoplasm of uncertain behavior of skin: Secondary | ICD-10-CM | POA: Diagnosis not present

## 2024-01-21 ENCOUNTER — Encounter: Payer: Self-pay | Admitting: Nurse Practitioner

## 2024-01-22 ENCOUNTER — Telehealth: Payer: Self-pay | Admitting: *Deleted

## 2024-01-22 NOTE — Telephone Encounter (Signed)
 Received referral from Dr. Sherwood Gambler for sebaceous cyst and pigmented skin lesion. No documentation noted in referral.   Call placed to PCP office for further information. Reports that patient was not physically seen or examined at visit as it was a telehealth visit. ABTx were given.   Of note, patient also requested Dr. Henreitta Leber for consult.   Call placed to patient. Reported that cyst like area noted to chest wall above right breast and is extending to sternum. States that area has enlarged since the referral orders were placed. States that area is very tender as well. Advised to return to PCP or to go to ER for evaluation.   Patient requested to schedule consult with Dr. Henreitta Leber as well. OV scheduled.

## 2024-01-25 ENCOUNTER — Telehealth (HOSPITAL_COMMUNITY): Payer: Self-pay | Admitting: *Deleted

## 2024-01-25 NOTE — Telephone Encounter (Signed)
 Reaching out to patient to offer assistance regarding upcoming cardiac imaging study; pt verbalizes understanding of appt date/time, parking situation and where to check in, pre-test NPO status and medications ordered, and verified current allergies; name and call back number provided for further questions should they arise Johney Frame RN Navigator Cardiac Imaging [facility] [phone] office [phone] cell

## 2024-01-26 ENCOUNTER — Ambulatory Visit (HOSPITAL_COMMUNITY)
Admission: RE | Admit: 2024-01-26 | Discharge: 2024-01-26 | Disposition: A | Discharge status: Home / Self Care | Payer: BC Managed Care – PPO | Source: Ambulatory Visit | Attending: Internal Medicine | Admitting: Internal Medicine

## 2024-01-26 ENCOUNTER — Ambulatory Visit (HOSPITAL_BASED_OUTPATIENT_CLINIC_OR_DEPARTMENT_OTHER)
Admission: RE | Admit: 2024-01-26 | Discharge: 2024-01-26 | Disposition: A | Discharge status: Home / Self Care | Payer: BC Managed Care – PPO | Source: Ambulatory Visit | Attending: Internal Medicine | Admitting: Internal Medicine

## 2024-01-26 VITALS — BP 125/71 | Resp 13

## 2024-01-26 DIAGNOSIS — R931 Abnormal findings on diagnostic imaging of heart and coronary circulation: Secondary | ICD-10-CM | POA: Insufficient documentation

## 2024-01-26 DIAGNOSIS — I25119 Atherosclerotic heart disease of native coronary artery with unspecified angina pectoris: Secondary | ICD-10-CM | POA: Diagnosis not present

## 2024-01-26 DIAGNOSIS — Z136 Encounter for screening for cardiovascular disorders: Secondary | ICD-10-CM | POA: Diagnosis not present

## 2024-01-26 DIAGNOSIS — Z01810 Encounter for preprocedural cardiovascular examination: Secondary | ICD-10-CM | POA: Insufficient documentation

## 2024-01-26 DIAGNOSIS — I2510 Atherosclerotic heart disease of native coronary artery without angina pectoris: Secondary | ICD-10-CM | POA: Diagnosis not present

## 2024-01-26 DIAGNOSIS — R079 Chest pain, unspecified: Secondary | ICD-10-CM | POA: Insufficient documentation

## 2024-01-26 DIAGNOSIS — I209 Angina pectoris, unspecified: Secondary | ICD-10-CM | POA: Insufficient documentation

## 2024-01-26 MED ORDER — NITROGLYCERIN 0.4 MG SL SUBL
0.8000 mg | SUBLINGUAL_TABLET | Freq: Once | SUBLINGUAL | Status: AC
  Administered 2024-01-26: 0.8 mg via SUBLINGUAL

## 2024-01-26 MED ORDER — NITROGLYCERIN 0.4 MG SL SUBL
SUBLINGUAL_TABLET | SUBLINGUAL | Status: AC
  Filled 2024-01-26: qty 2

## 2024-01-26 MED ORDER — IOHEXOL 350 MG/ML SOLN
100.0000 mL | Freq: Once | INTRAVENOUS | Status: AC | PRN
  Administered 2024-01-26: 100 mL via INTRAVENOUS

## 2024-01-29 ENCOUNTER — Telehealth: Payer: Self-pay

## 2024-01-29 DIAGNOSIS — I2510 Atherosclerotic heart disease of native coronary artery without angina pectoris: Secondary | ICD-10-CM

## 2024-01-29 LAB — POCT I-STAT CREATININE: Creatinine, Ser: 1.2 mg/dL — ABNORMAL HIGH (ref 0.44–1.00)

## 2024-01-29 MED ORDER — ASPIRIN 81 MG PO TBEC: 81.0000 mg | DELAYED_RELEASE_TABLET | Freq: Every day | ORAL | 5 refills | Status: DC

## 2024-01-29 MED ORDER — ATORVASTATIN CALCIUM 40 MG PO TABS: 40.0000 mg | ORAL_TABLET | Freq: Every day | ORAL | 3 refills | Status: DC

## 2024-01-29 MED ORDER — ISOSORBIDE MONONITRATE ER 30 MG PO TB24: 30.0000 mg | ORAL_TABLET | Freq: Every day | ORAL | 5 refills | Status: DC

## 2024-01-29 NOTE — Telephone Encounter (Signed)
 The patient has been notified of the result and verbalized understanding.  All questions (if any) were answered. [name], [place] 01/29/2024 10:26 AM

## 2024-01-29 NOTE — Telephone Encounter (Signed)
-----   Message from Vishnu P Mallipeddi sent at 01/26/2024  5:17 PM EST ----- Severe mid to distal LAD stenosis, significant by FFR. Schedule follow up with MD/APP in 1-2 weeks to set up for LHC. Obtain Echocardiogram. Start Imdur 30 mg once daily. Side effects include hypotension, dizziness, headache. If she experiences these symptoms, need to let us know., can start BB or CCB then. Provide ER precautions for chest pain. Start aspirin 81 mg once daily, increase atorvastatin from 20 mg to 40 mg at bedtime.

## 2024-01-30 ENCOUNTER — Telehealth: Payer: Self-pay | Admitting: Internal Medicine

## 2024-01-30 NOTE — Telephone Encounter (Signed)
 Checking percert on the following patient for testing scheduled at [facility].    ECHO - 01/31/2024

## 2024-01-31 ENCOUNTER — Ambulatory Visit (HOSPITAL_COMMUNITY)
Admission: RE | Admit: 2024-01-31 | Discharge: 2024-01-31 | Disposition: A | Discharge status: Home / Self Care | Payer: BC Managed Care – PPO | Source: Ambulatory Visit | Attending: Internal Medicine | Admitting: Internal Medicine

## 2024-01-31 DIAGNOSIS — I2510 Atherosclerotic heart disease of native coronary artery without angina pectoris: Secondary | ICD-10-CM | POA: Insufficient documentation

## 2024-01-31 LAB — ECHOCARDIOGRAM COMPLETE
AR max vel: 1.82 cm2
AV Area VTI: 1.89 cm2
AV Area mean vel: 1.92 cm2
AV Mean grad: 5 mmHg
AV Peak grad: 10.8 mmHg
Ao pk vel: 1.64 m/s
Area-P 1/2: 2.83 cm2
S' Lateral: 2.7 cm

## 2024-01-31 NOTE — Progress Notes (Signed)
*  PRELIMINARY RESULTS* Echocardiogram 2D Echocardiogram has been performed.  Stacey Drain 01/31/2024, 11:48 AM

## 2024-02-05 ENCOUNTER — Ambulatory Visit: Payer: BC Managed Care – PPO | Attending: Internal Medicine | Admitting: Internal Medicine

## 2024-02-05 ENCOUNTER — Encounter: Payer: Self-pay | Admitting: Internal Medicine

## 2024-02-05 VITALS — BP 138/80 | HR 75 | Ht 64.0 in | Wt 213.2 lb

## 2024-02-05 DIAGNOSIS — Z01818 Encounter for other preprocedural examination: Secondary | ICD-10-CM

## 2024-02-05 DIAGNOSIS — I25118 Atherosclerotic heart disease of native coronary artery with other forms of angina pectoris: Secondary | ICD-10-CM | POA: Diagnosis not present

## 2024-02-05 DIAGNOSIS — E781 Pure hyperglyceridemia: Secondary | ICD-10-CM | POA: Insufficient documentation

## 2024-02-05 DIAGNOSIS — I2510 Atherosclerotic heart disease of native coronary artery without angina pectoris: Secondary | ICD-10-CM | POA: Insufficient documentation

## 2024-02-05 MED ORDER — METOPROLOL TARTRATE 25 MG PO TABS
25.0000 mg | ORAL_TABLET | Freq: Two times a day (BID) | ORAL | 3 refills | Status: DC
Start: 2024-02-05 — End: 2024-02-05

## 2024-02-05 MED ORDER — METOPROLOL TARTRATE 25 MG PO TABS: 25.0000 mg | ORAL_TABLET | Freq: Two times a day (BID) | ORAL | 3 refills | Status: DC

## 2024-02-05 NOTE — Progress Notes (Signed)
 Cardiology Office Note  Date: 02/05/2024   ID: Nichole, [name] [birth year], MRN [id number]  PCP:  Elfredia Nevins, MD  Cardiologist:  Marjo Bicker, MD Electrophysiologist:  None   History of Present Illness: [name] is a 53 y.o. female known to have HTN, insulin-dependent diabetes mellitus type 2, autoimmune thyroiditis on thyroid supplements is here for follow-up visit.  Patient was initially referred to cardiology clinic for evaluation of chest pain.  She was having exertional chest pain (exertional activities include climbing stairs and walking her dog) 2 times per week and lasting for minutes. Due to cardiac risk factors including HTN and insulin-dependent diabetes mellitus, she underwent CT cardiac and echocardiogram.  CT cardiac showed coronary calcium score of 67 (95th percentile for age and sex matched control), total plaque volume is 109, 75th percentile for age and sex matched control.  There was evidence of moderate soft plaque stenosis in the mid LAD followed by a severe soft plaque stenosis (70-99%).  There is low-attenuation plaque over here.  CT FFR analysis showed significant functional stenosis in the mid to distal LAD area.  Otherwise, there is mild nonobstructive CAD in the remainder of the vessels.  There is evidence of soft plaque in all the coronary arteries.  Echocardiogram showed normal LVEF and no valvular heart disease.  I resulted on CT cardiac report and started her on Imdur 30 mg once daily.  Patient is here for follow-up visit.  Patient limited her exertional activities (climbing stairs and walking her dog) to avoid chest pain.  No interval angina.  She is having chest pains but that is from anxiety/panic attacks.  She started Imdur but did not tolerate due to dizziness, headaches, other side effects.  No other symptoms of DOE, syncope, palpitations or leg swelling.  Former smoker.  Currently diabetes is very well under control.  HbA1c 7.2 recently.   Around 3 years ago, it was 66.  Currently on insulin for diabetes.  Lipid panel from September 24 showed TG 335 and LDL 73.   Past Medical History:  Diagnosis Date   Anxiety    BV (bacterial vaginosis)    BV   Cancer (HCC) 2009   papillary thyroid cancer   Central vestibular vertigo    Depression    Diabetes mellitus without complication (HCC)    Family history of breast cancer    Hyperlipidemia    Hypertension    Hypothyroidism    IBS (irritable bowel syndrome)    Thyroid disease     Past Surgical History:  Procedure Laterality Date   ABLATION     CARPAL TUNNEL RELEASE     CARPAL TUNNEL RELEASE Right    CESAREAN SECTION     CYST EXCISION N/A 09/12/2022   Procedure: CYST REMOVAL, NECK;  Surgeon: Lucretia Roers, MD;  Location: [facility];  Service: General;  Laterality: N/A;   LIPOMA EXCISION Left 09/12/2022   Procedure: EXCISION LIPOMA, THIGH;  Surgeon: Lucretia Roers, MD;  Location: [facility];  Service: General;  Laterality: Left;   NECK AND CHEST LESION     THYROIDECTOMY     Right and Left, 05/2008, 09/2008   WISDOM TOOTH EXTRACTION      Current Outpatient Medications  Medication Sig Dispense Refill   ALPRAZolam (XANAX) 0.5 MG tablet Take 0.5 mg by mouth 2 (two) times daily as needed for anxiety.     aspirin EC 81 MG tablet Take 1 tablet (81 mg total) by mouth daily.  Swallow whole. 30 tablet 5   atorvastatin (LIPITOR) 40 MG tablet Take 1 tablet (40 mg total) by mouth daily. 30 tablet 3   B-D UF III MINI PEN NEEDLES 31G X 5 MM MISC USE AND DISCARD 1 PEN      NEEDLE IN THE MORNING, AT   NOON, IN THE EVENING, AND  AT BEDTIME 100 each 0   B-D UF III MINI PEN NEEDLES 31G X 5 MM MISC USE AND DISCARD 1 PEN      NEEDLE 3 TIMES A DAY FOR   SUBCUTANEOUS INJECTION 270 each 1   B-D UF III MINI PEN NEEDLES 31G X 5 MM MISC Use to inject insulin 4 times daily 300 each 3   blood glucose meter kit and supplies KIT Dispense based on patient and insurance preference. Use up to four times  daily as directed. 1 each 0   butalbital-acetaminophen-caffeine (FIORICET) 50-325-40 MG tablet Take 1 tablet by mouth 2 (two) times daily as needed for headache.     diazepam (VALIUM) 2 MG tablet Take 2 mg by mouth 2 (two) times daily as needed for anxiety.     diphenhydrAMINE (BENADRYL) 25 MG tablet Take 25 mg by mouth daily as needed for allergies.     DULoxetine (CYMBALTA) 30 MG capsule Take 30 mg by mouth 2 (two) times daily.     fenofibrate micronized (LOFIBRA) 200 MG capsule Take 200 mg by mouth daily before breakfast.     fluconazole (DIFLUCAN) 100 MG tablet Take 100 mg by mouth daily as needed (Yeast).     gabapentin (NEURONTIN) 800 MG tablet Take 800 mg by mouth 4 (four) times daily.     glimepiride (AMARYL) 4 MG tablet TAKE 1 TABLET DAILY 30 tablet 1   HYDROcodone-acetaminophen (NORCO) 7.5-325 MG tablet Take 1 tablet by mouth every 8 (eight) hours as needed for severe pain.     hydrOXYzine (ATARAX/VISTARIL) 25 MG tablet Take 25 mg by mouth every 4 (four) hours as needed.     insulin aspart (NOVOLOG FLEXPEN) 100 UNIT/ML FlexPen Inject 5-11 Units into the skin 3 (three) times daily with meals. 30 mL 3   insulin glargine, 2 Unit Dial, (TOUJEO MAX SOLOSTAR) 300 UNIT/ML Solostar Pen Inject 54 Units into the skin at bedtime. 18 mL 3   isosorbide mononitrate (IMDUR) 30 MG 24 hr tablet Take 1 tablet (30 mg total) by mouth daily. 30 tablet 5   Lancets (ONETOUCH DELICA PLUS LANCET33G) MISC Apply 1 each topically 4 (four) times daily.     lisinopril (ZESTRIL) 2.5 MG tablet Take 2.5 mg by mouth daily.     meclizine (ANTIVERT) 25 MG tablet Take 25 mg by mouth 4 (four) times daily as needed for dizziness.     Melatonin 10 MG TABS Take 10 mg by mouth at bedtime as needed (sleep).     meloxicam (MOBIC) 7.5 MG tablet Take 7.5 mg by mouth 2 (two) times daily as needed for pain.     metoprolol tartrate (LOPRESSOR) 100 MG tablet Take 1 tablet (100 mg total) by mouth once for 1 dose. 2 hours prior to  procedure 1 tablet 0   ONETOUCH VERIO test strip 1 each 4 (four) times daily.     scopolamine (TRANSDERM-SCOP) 1 MG/3DAYS Place 1 patch onto the skin See admin instructions. As needed     SYNTHROID 175 MCG tablet TAKE 1 TABLET DAILY 90 tablet 0   traMADol (ULTRAM) 50 MG tablet Take 100 mg by mouth 3 (three) times  daily as needed for moderate pain.     Vitamin D, Ergocalciferol, (DRISDOL) 1.25 MG (50000 UNIT) CAPS capsule Take 50,000 Units by mouth daily. Takes OTC     No current facility-administered medications for this visit.   Allergies:  Aspirin, Sulfa antibiotics, and Sulfamethoxazole-trimethoprim   Social History: The patient  reports that she has quit smoking. She has never used smokeless tobacco. She reports current alcohol use. She reports that she does not use drugs.   Family History: The patient's family history includes Breast cancer in her maternal grandmother and mother; Diabetes in her father; Hyperlipidemia in her father; Hypertension in her father; Lung cancer in her maternal uncle; Mental illness in her mother.   ROS:  Please see the history of present illness. Otherwise, complete review of systems is positive for none  All other systems are reviewed and negative.   Physical Exam: VS:  BP 138/80   Pulse 75   Ht 5\' 4"  (1.626 m)   Wt 213 lb 3.2 oz (96.7 kg)   SpO2 98%   BMI 36.60 kg/m , BMI Body mass index is 36.6 kg/m.  Wt Readings from Last 3 Encounters:  02/05/24 213 lb 3.2 oz (96.7 kg)  12/21/23 217 lb 9.6 oz (98.7 kg)  10/20/23 216 lb (98 kg)    General: Patient appears comfortable at rest. HEENT: Conjunctiva and lids normal, oropharynx clear with moist mucosa. Neck: Supple, no elevated JVP or carotid bruits, no thyromegaly. Lungs: Clear to auscultation, nonlabored breathing at rest. Cardiac: Regular rate and rhythm, no S3 or significant systolic murmur, no pericardial rub. Abdomen: Soft, nontender, no hepatomegaly, bowel sounds present, no guarding or  rebound. Extremities: No pitting edema, distal pulses 2+. Skin: Warm and dry. Musculoskeletal: No kyphosis. Neuropsychiatric: Alert and oriented x3, affect grossly appropriate.  Recent Labwork: 08/14/2023: ALT 45; AST 20; BUN 22; Potassium 4.7; Sodium 139; TSH 5.630 01/26/2024: Creatinine, Ser 1.20     Component Value Date/Time   CHOL 161 08/14/2023 0841   TRIG 335 (H) 08/14/2023 0841   HDL 34 (L) 08/14/2023 0841   CHOLHDL 4.7 (H) 08/14/2023 0841   LDLCALC 73 08/14/2023 0841     Assessment and Plan:   CAD manifested by SIHD with imaging evidence of flow-limiting mid to distal LAD disease, currently has stable angina: Ongoing exertional chest pain (with activities like climbing stairs, walking her dog), lasting for minutes and resolving with rest.  Frequency 2 times per week.  After she saw me around 1 month ago, she avoided climbing stairs and walking her dog and hence did not have any more angina.  I started her on Imdur but she did not tolerate due to side effects of headache, dizziness.  Will stop Imdur and start metoprolol tartrate 25 mg twice daily.  Instructed patient not to avoid any exertional activities.  Echocardiogram showed normal LVEF and no valvular heart disease.  Will continue cardioprotective medications, aspirin 81 mg once daily, atorvastatin 40 mg nightly and fenofibrate 200 mg once daily. She will benefit from invasive ischemia evaluation with LHC due to flow-limiting lesion in the mid to distal LAD and stable angina.  Cardiac risk factors include HTN, insulin-dependent diabetes mellitus and former smoker.  She is scheduled for LHC on 02/09/2024 with Dr. Herbie Baltimore.  HLD, hypertriglyceridemia: Lipid panel from September 2024 reviewed, TG is 335 (elevated) and LDL 73 (normal).  Will need to repeat fasting lipid panel and anticipate switching fenofibrate to Vascepa if TG still elevated.  Otherwise, continue atorvastatin 40 mg  nightly and fenofibrate 200 mg once daily for now.  No  myalgias.  HTN, controlled: Continue lisinopril 2.5 mg once daily.  Insulin-dependent epididymitis type II: On insulin, follows with PCP.  HbA1c 7.2 recently.  Very well-controlled.      Medication Adjustments/Labs and Tests Ordered: Current medicines are reviewed at length with the patient today.  Concerns regarding medicines are outlined above.    Disposition:  Follow up 1 month after LHC  Signed Fany Cavanaugh Verne Spurr, MD, 02/05/2024 9:59 AM    [facility] at [facility] [address] [place], [place], [place] [address]

## 2024-02-05 NOTE — Patient Instructions (Signed)
 Medication Instructions:  Your physician has recommended you make the following change in your medication:   -Stop Imdur -Start Metoprolol Tartrate 25 mg twice daily.   *If you need a refill on your cardiac medications before your next appointment, please call your pharmacy*   Lab Work: TODAY:  CBC BMET  If you have labs (blood work) drawn today and your tests are completely normal, you will receive your results only by: MyChart Message (if you have MyChart) OR A paper copy in the mail If you have any lab test that is abnormal or we need to change your treatment, we will call you to review the results.   Testing/Procedures: Left Heart Cath   Follow-Up: At [facility], you and your health needs are our priority.  As part of our continuing mission to provide you with exceptional heart care, we have created designated Provider Care Teams.  These Care Teams include your primary Cardiologist (physician) and Advanced Practice Providers (APPs -  Physician Assistants and Nurse Practitioners) who all work together to provide you with the care you need, when you need it.  We recommend signing up for the patient portal called "MyChart".  Sign up information is provided on this After Visit Summary.  MyChart is used to connect with patients for Virtual Visits (Telemedicine).  Patients are able to view lab/test results, encounter notes, upcoming appointments, etc.  Non-urgent messages can be sent to your provider as well.   To learn more about what you can do with MyChart, go to [email or web address].    Your next appointment:   1 month(s)  Provider:   You may see Vishnu P Mallipeddi, MD or one of the following Advanced Practice Providers on your designated Care Team:   [place], PA-C  Jacolyn Reedy, PA-C     Other Instructions  [facility] A DEPT OF [facility] [address] [place] [address] Dept:  [phone] Loc: [phone]  [name]  02/05/2024  You are scheduled for a Cardiac Catheterization on Friday, March 7 with Dr. Bryan Lemma.  1. Please arrive at the [facility] (Main Entrance A) at [facility]: [address] [place], [place] [address] at 10:00 AM (This time is 2 hour(s) before your procedure to ensure your preparation).   Free valet parking service is available. You will check in at ADMITTING. The support person will be asked to wait in the waiting room.  It is OK to have someone drop you off and come back when you are ready to be discharged.    Special note: Every effort is made to have your procedure done on time. Please understand that emergencies sometimes delay scheduled procedures.  2. Diet: Do not eat solid foods after midnight.  The patient may have clear liquids until 5am upon the day of the procedure.  3. Labs: You will need to have blood drawn on  TODAY. You do not need to be fasting.  4. Medication instructions in preparation for your procedure:   Contrast Allergy: No  Take only 27 units of insulin the night before your procedure. Do not take any insulin on the day of the procedure.- [place]  Do not take Diabetes Med Glimepiride  on the day of the procedure and HOLD 48 HOURS AFTER THE PROCEDURE.  On the morning of your procedure, take your Aspirin 81 mg and any morning medicines NOT listed above.  You may use sips  of water.  5. Plan to go home the same day, you will only stay overnight if medically necessary. 6. Bring a current list of your medications and current insurance cards. 7. You MUST have a responsible person to drive you home. 8. Someone MUST be with you the first 24 hours after you arrive home or your discharge will be delayed. 9. Please wear clothes that are easy to get on and off and wear slip-on shoes.  Thank you for allowing [place] to care for you!   -- [facility]

## 2024-02-05 NOTE — H&P (View-Only) (Signed)
 Cardiology Office Note  Date: 02/05/2024   ID: [name] [birth year], MRN [id number]  PCP:  Nichole Nevins, MD  Cardiologist:  Nichole Bicker, MD Electrophysiologist:  None   History of Present Illness: [name] is a 53 y.o. female known to have HTN, insulin-dependent diabetes mellitus type 2, autoimmune thyroiditis on thyroid supplements is here for follow-up visit.  Patient was initially referred to cardiology clinic for evaluation of chest pain.  She was having exertional chest pain (exertional activities include climbing stairs and walking her dog) 2 times per week and lasting for minutes. Due to cardiac risk factors including HTN and insulin-dependent diabetes mellitus, she underwent CT cardiac and echocardiogram.  CT cardiac showed coronary calcium score of 67 (95th percentile for age and sex matched control), total plaque volume is 109, 75th percentile for age and sex matched control.  There was evidence of moderate soft plaque stenosis in the mid LAD followed by a severe soft plaque stenosis (70-99%).  There is low-attenuation plaque over here.  CT FFR analysis showed significant functional stenosis in the mid to distal LAD area.  Otherwise, there is mild nonobstructive CAD in the remainder of the vessels.  There is evidence of soft plaque in all the coronary arteries.  Echocardiogram showed normal LVEF and no valvular heart disease.  I resulted on CT cardiac report and started her on Imdur 30 mg once daily.  Patient is here for follow-up visit.  Patient limited her exertional activities (climbing stairs and walking her dog) to avoid chest pain.  No interval angina.  She is having chest pains but that is from anxiety/panic attacks.  She started Imdur but did not tolerate due to dizziness, headaches, other side effects.  No other symptoms of DOE, syncope, palpitations or leg swelling.  Former smoker.  Currently diabetes is very well under control.  HbA1c 7.2 recently.   Around 3 years ago, it was 66.  Currently on insulin for diabetes.  Lipid panel from September 24 showed TG 335 and LDL 73.   Past Medical History:  Diagnosis Date   Anxiety    BV (bacterial vaginosis)    BV   Cancer (HCC) 2009   papillary thyroid cancer   Central vestibular vertigo    Depression    Diabetes mellitus without complication (HCC)    Family history of breast cancer    Hyperlipidemia    Hypertension    Hypothyroidism    IBS (irritable bowel syndrome)    Thyroid disease     Past Surgical History:  Procedure Laterality Date   ABLATION     CARPAL TUNNEL RELEASE     CARPAL TUNNEL RELEASE Right    CESAREAN SECTION     CYST EXCISION N/A 09/12/2022   Procedure: CYST REMOVAL, NECK;  Surgeon: Nichole Roers, MD;  Location: [facility];  Service: General;  Laterality: N/A;   LIPOMA EXCISION Left 09/12/2022   Procedure: EXCISION LIPOMA, THIGH;  Surgeon: Nichole Roers, MD;  Location: [facility];  Service: General;  Laterality: Left;   NECK AND CHEST LESION     THYROIDECTOMY     Right and Left, 05/2008, 09/2008   WISDOM TOOTH EXTRACTION      Current Outpatient Medications  Medication Sig Dispense Refill   ALPRAZolam (XANAX) 0.5 MG tablet Take 0.5 mg by mouth 2 (two) times daily as needed for anxiety.     aspirin EC 81 MG tablet Take 1 tablet (81 mg total) by mouth daily.  Swallow whole. 30 tablet 5   atorvastatin (LIPITOR) 40 MG tablet Take 1 tablet (40 mg total) by mouth daily. 30 tablet 3   B-D UF III MINI PEN NEEDLES 31G X 5 MM MISC USE AND DISCARD 1 PEN      NEEDLE IN THE MORNING, AT   NOON, IN THE EVENING, AND  AT BEDTIME 100 each 0   B-D UF III MINI PEN NEEDLES 31G X 5 MM MISC USE AND DISCARD 1 PEN      NEEDLE 3 TIMES A DAY FOR   SUBCUTANEOUS INJECTION 270 each 1   B-D UF III MINI PEN NEEDLES 31G X 5 MM MISC Use to inject insulin 4 times daily 300 each 3   blood glucose meter kit and supplies KIT Dispense based on patient and insurance preference. Use up to four times  daily as directed. 1 each 0   butalbital-acetaminophen-caffeine (FIORICET) 50-325-40 MG tablet Take 1 tablet by mouth 2 (two) times daily as needed for headache.     diazepam (VALIUM) 2 MG tablet Take 2 mg by mouth 2 (two) times daily as needed for anxiety.     diphenhydrAMINE (BENADRYL) 25 MG tablet Take 25 mg by mouth daily as needed for allergies.     DULoxetine (CYMBALTA) 30 MG capsule Take 30 mg by mouth 2 (two) times daily.     fenofibrate micronized (LOFIBRA) 200 MG capsule Take 200 mg by mouth daily before breakfast.     fluconazole (DIFLUCAN) 100 MG tablet Take 100 mg by mouth daily as needed (Yeast).     gabapentin (NEURONTIN) 800 MG tablet Take 800 mg by mouth 4 (four) times daily.     glimepiride (AMARYL) 4 MG tablet TAKE 1 TABLET DAILY 30 tablet 1   HYDROcodone-acetaminophen (NORCO) 7.5-325 MG tablet Take 1 tablet by mouth every 8 (eight) hours as needed for severe pain.     hydrOXYzine (ATARAX/VISTARIL) 25 MG tablet Take 25 mg by mouth every 4 (four) hours as needed.     insulin aspart (NOVOLOG FLEXPEN) 100 UNIT/ML FlexPen Inject 5-11 Units into the skin 3 (three) times daily with meals. 30 mL 3   insulin glargine, 2 Unit Dial, (TOUJEO MAX SOLOSTAR) 300 UNIT/ML Solostar Pen Inject 54 Units into the skin at bedtime. 18 mL 3   isosorbide mononitrate (IMDUR) 30 MG 24 hr tablet Take 1 tablet (30 mg total) by mouth daily. 30 tablet 5   Lancets (ONETOUCH DELICA PLUS LANCET33G) MISC Apply 1 each topically 4 (four) times daily.     lisinopril (ZESTRIL) 2.5 MG tablet Take 2.5 mg by mouth daily.     meclizine (ANTIVERT) 25 MG tablet Take 25 mg by mouth 4 (four) times daily as needed for dizziness.     Melatonin 10 MG TABS Take 10 mg by mouth at bedtime as needed (sleep).     meloxicam (MOBIC) 7.5 MG tablet Take 7.5 mg by mouth 2 (two) times daily as needed for pain.     metoprolol tartrate (LOPRESSOR) 100 MG tablet Take 1 tablet (100 mg total) by mouth once for 1 dose. 2 hours prior to  procedure 1 tablet 0   ONETOUCH VERIO test strip 1 each 4 (four) times daily.     scopolamine (TRANSDERM-SCOP) 1 MG/3DAYS Place 1 patch onto the skin See admin instructions. As needed     SYNTHROID 175 MCG tablet TAKE 1 TABLET DAILY 90 tablet 0   traMADol (ULTRAM) 50 MG tablet Take 100 mg by mouth 3 (three) times  daily as needed for moderate pain.     Vitamin D, Ergocalciferol, (DRISDOL) 1.25 MG (50000 UNIT) CAPS capsule Take 50,000 Units by mouth daily. Takes OTC     No current facility-administered medications for this visit.   Allergies:  Aspirin, Sulfa antibiotics, and Sulfamethoxazole-trimethoprim   Social History: The patient  reports that she has quit smoking. She has never used smokeless tobacco. She reports current alcohol use. She reports that she does not use drugs.   Family History: The patient's family history includes Breast cancer in her maternal grandmother and mother; Diabetes in her father; Hyperlipidemia in her father; Hypertension in her father; Lung cancer in her maternal uncle; Mental illness in her mother.   ROS:  Please see the history of present illness. Otherwise, complete review of systems is positive for none  All other systems are reviewed and negative.   Physical Exam: VS:  BP 138/80   Pulse 75   Ht 5\' 4"  (1.626 m)   Wt 213 lb 3.2 oz (96.7 kg)   SpO2 98%   BMI 36.60 kg/m , BMI Body mass index is 36.6 kg/m.  Wt Readings from Last 3 Encounters:  02/05/24 213 lb 3.2 oz (96.7 kg)  12/21/23 217 lb 9.6 oz (98.7 kg)  10/20/23 216 lb (98 kg)    General: Patient appears comfortable at rest. HEENT: Conjunctiva and lids normal, oropharynx clear with moist mucosa. Neck: Supple, no elevated JVP or carotid bruits, no thyromegaly. Lungs: Clear to auscultation, nonlabored breathing at rest. Cardiac: Regular rate and rhythm, no S3 or significant systolic murmur, no pericardial rub. Abdomen: Soft, nontender, no hepatomegaly, bowel sounds present, no guarding or  rebound. Extremities: No pitting edema, distal pulses 2+. Skin: Warm and dry. Musculoskeletal: No kyphosis. Neuropsychiatric: Alert and oriented x3, affect grossly appropriate.  Recent Labwork: 08/14/2023: ALT 45; AST 20; BUN 22; Potassium 4.7; Sodium 139; TSH 5.630 01/26/2024: Creatinine, Ser 1.20     Component Value Date/Time   CHOL 161 08/14/2023 0841   TRIG 335 (H) 08/14/2023 0841   HDL 34 (L) 08/14/2023 0841   CHOLHDL 4.7 (H) 08/14/2023 0841   LDLCALC 73 08/14/2023 0841     Assessment and Plan:   CAD manifested by SIHD with imaging evidence of flow-limiting mid to distal LAD disease, currently has stable angina: Ongoing exertional chest pain (with activities like climbing stairs, walking her dog), lasting for minutes and resolving with rest.  Frequency 2 times per week.  After she saw me around 1 month ago, she avoided climbing stairs and walking her dog and hence did not have any more angina.  I started her on Imdur but she did not tolerate due to side effects of headache, dizziness.  Will stop Imdur and start metoprolol tartrate 25 mg twice daily.  Instructed patient not to avoid any exertional activities.  Echocardiogram showed normal LVEF and no valvular heart disease.  Will continue cardioprotective medications, aspirin 81 mg once daily, atorvastatin 40 mg nightly and fenofibrate 200 mg once daily. She will benefit from invasive ischemia evaluation with LHC due to flow-limiting lesion in the mid to distal LAD and stable angina.  Cardiac risk factors include HTN, insulin-dependent diabetes mellitus and former smoker.  She is scheduled for LHC on 02/09/2024 with Dr. Herbie Baltimore.  HLD, hypertriglyceridemia: Lipid panel from September 2024 reviewed, TG is 335 (elevated) and LDL 73 (normal).  Will need to repeat fasting lipid panel and anticipate switching fenofibrate to Vascepa if TG still elevated.  Otherwise, continue atorvastatin 40 mg  nightly and fenofibrate 200 mg once daily for now.  No  myalgias.  HTN, controlled: Continue lisinopril 2.5 mg once daily.  Insulin-dependent epididymitis type II: On insulin, follows with PCP.  HbA1c 7.2 recently.  Very well-controlled.      Medication Adjustments/Labs and Tests Ordered: Current medicines are reviewed at length with the patient today.  Concerns regarding medicines are outlined above.    Disposition:  Follow up 1 month after LHC  Signed Fany Cavanaugh Verne Spurr, MD, 02/05/2024 9:59 AM    [facility] at [facility] [address] [place], [place], [place] [address]

## 2024-02-06 ENCOUNTER — Other Ambulatory Visit (HOSPITAL_COMMUNITY)
Admission: RE | Admit: 2024-02-06 | Discharge: 2024-02-06 | Disposition: A | Discharge status: Home / Self Care | Source: Ambulatory Visit | Attending: Internal Medicine | Admitting: Internal Medicine

## 2024-02-06 DIAGNOSIS — I209 Angina pectoris, unspecified: Secondary | ICD-10-CM | POA: Diagnosis not present

## 2024-02-06 DIAGNOSIS — I10 Essential (primary) hypertension: Secondary | ICD-10-CM | POA: Diagnosis not present

## 2024-02-06 DIAGNOSIS — R931 Abnormal findings on diagnostic imaging of heart and coronary circulation: Secondary | ICD-10-CM | POA: Diagnosis not present

## 2024-02-06 DIAGNOSIS — Z01818 Encounter for other preprocedural examination: Secondary | ICD-10-CM | POA: Insufficient documentation

## 2024-02-06 DIAGNOSIS — Z7982 Long term (current) use of aspirin: Secondary | ICD-10-CM | POA: Diagnosis not present

## 2024-02-06 DIAGNOSIS — E785 Hyperlipidemia, unspecified: Secondary | ICD-10-CM | POA: Diagnosis not present

## 2024-02-06 DIAGNOSIS — Z87891 Personal history of nicotine dependence: Secondary | ICD-10-CM | POA: Diagnosis not present

## 2024-02-06 DIAGNOSIS — Z794 Long term (current) use of insulin: Secondary | ICD-10-CM | POA: Diagnosis not present

## 2024-02-06 DIAGNOSIS — Z8249 Family history of ischemic heart disease and other diseases of the circulatory system: Secondary | ICD-10-CM | POA: Diagnosis not present

## 2024-02-06 DIAGNOSIS — E119 Type 2 diabetes mellitus without complications: Secondary | ICD-10-CM | POA: Diagnosis not present

## 2024-02-06 DIAGNOSIS — E063 Autoimmune thyroiditis: Secondary | ICD-10-CM | POA: Diagnosis not present

## 2024-02-06 DIAGNOSIS — Z79899 Other long term (current) drug therapy: Secondary | ICD-10-CM | POA: Diagnosis not present

## 2024-02-06 DIAGNOSIS — Z7902 Long term (current) use of antithrombotics/antiplatelets: Secondary | ICD-10-CM | POA: Diagnosis not present

## 2024-02-06 DIAGNOSIS — I25118 Atherosclerotic heart disease of native coronary artery with other forms of angina pectoris: Secondary | ICD-10-CM | POA: Diagnosis not present

## 2024-02-06 LAB — CBC
HCT: 38 % (ref 36.0–46.0)
Hemoglobin: 12.7 g/dL (ref 12.0–15.0)
MCH: 30 pg (ref 26.0–34.0)
MCHC: 33.4 g/dL (ref 30.0–36.0)
MCV: 89.8 fL (ref 80.0–100.0)
Platelets: 192 10*3/uL (ref 150–400)
RBC: 4.23 MIL/uL (ref 3.87–5.11)
RDW: 12.6 % (ref 11.5–15.5)
WBC: 5.5 10*3/uL (ref 4.0–10.5)
nRBC: 0 % (ref 0.0–0.2)

## 2024-02-06 LAB — BASIC METABOLIC PANEL
Anion gap: 12 (ref 5–15)
BUN: 17 mg/dL (ref 6–20)
CO2: 27 mmol/L (ref 22–32)
Calcium: 9.3 mg/dL (ref 8.9–10.3)
Chloride: 100 mmol/L (ref 98–111)
Creatinine, Ser: 1.02 mg/dL — ABNORMAL HIGH (ref 0.44–1.00)
GFR, Estimated: >60 mL/min (ref >60)
Glucose, Bld: 139 mg/dL — ABNORMAL HIGH (ref 70–99)
Potassium: 4.2 mmol/L (ref 3.5–5.1)
Sodium: 139 mmol/L (ref 135–145)

## 2024-02-06 LAB — LIPID PANEL
Cholesterol: 151 mg/dL (ref 0–200)
HDL: 45 mg/dL (ref >40)
LDL Cholesterol: 76 mg/dL (ref 0–99)
Total CHOL/HDL Ratio: 3.4 ratio
Triglycerides: 148 mg/dL (ref <150)
VLDL: 30 mg/dL (ref 0–40)

## 2024-02-08 ENCOUNTER — Telehealth: Payer: Self-pay | Admitting: *Deleted

## 2024-02-08 NOTE — Telephone Encounter (Signed)
 Cardiac Catheterization scheduled at [facility] for: Friday February 09, 2024 12 Noon Arrival time [facility] Main Entrance A at: 10 AM  Nothing to eat after midnight prior to procedure, clear liquids until 5 AM day of procedure.  Medication instructions: -Hold:  Insulin-AM of procedure/1/2 usual Insulin dose HS prior to procedure  Glimepiride-AM of procedure -Other usual morning medications can be taken with sips of water including aspirin 81 mg.  You must have responsible adult to drive you home.  Someone must be with you the first 24 hours after you arrive home.  Plan to go home the same day, you will only stay overnight if medically necessary.  Reviewed procedure instructions with patient.

## 2024-02-09 ENCOUNTER — Encounter (HOSPITAL_COMMUNITY)
Admission: RE | Disposition: A | Discharge status: Home / Self Care | Payer: Self-pay | Source: Home / Self Care | Attending: Cardiology

## 2024-02-09 ENCOUNTER — Ambulatory Visit (HOSPITAL_COMMUNITY)
Admission: RE | Admit: 2024-02-09 | Discharge: 2024-02-09 | Disposition: A | Discharge status: Home / Self Care | Attending: Cardiology | Admitting: Cardiology

## 2024-02-09 ENCOUNTER — Other Ambulatory Visit (HOSPITAL_COMMUNITY): Payer: Self-pay

## 2024-02-09 ENCOUNTER — Other Ambulatory Visit: Payer: Self-pay

## 2024-02-09 DIAGNOSIS — I25119 Atherosclerotic heart disease of native coronary artery with unspecified angina pectoris: Secondary | ICD-10-CM | POA: Diagnosis not present

## 2024-02-09 DIAGNOSIS — I2510 Atherosclerotic heart disease of native coronary artery without angina pectoris: Secondary | ICD-10-CM | POA: Diagnosis present

## 2024-02-09 DIAGNOSIS — Z7902 Long term (current) use of antithrombotics/antiplatelets: Secondary | ICD-10-CM | POA: Insufficient documentation

## 2024-02-09 DIAGNOSIS — Z7982 Long term (current) use of aspirin: Secondary | ICD-10-CM | POA: Insufficient documentation

## 2024-02-09 DIAGNOSIS — E785 Hyperlipidemia, unspecified: Secondary | ICD-10-CM | POA: Diagnosis not present

## 2024-02-09 DIAGNOSIS — I209 Angina pectoris, unspecified: Secondary | ICD-10-CM | POA: Diagnosis not present

## 2024-02-09 DIAGNOSIS — E119 Type 2 diabetes mellitus without complications: Secondary | ICD-10-CM | POA: Diagnosis not present

## 2024-02-09 DIAGNOSIS — Z87891 Personal history of nicotine dependence: Secondary | ICD-10-CM | POA: Diagnosis not present

## 2024-02-09 DIAGNOSIS — R931 Abnormal findings on diagnostic imaging of heart and coronary circulation: Secondary | ICD-10-CM | POA: Insufficient documentation

## 2024-02-09 DIAGNOSIS — Z794 Long term (current) use of insulin: Secondary | ICD-10-CM | POA: Diagnosis not present

## 2024-02-09 DIAGNOSIS — E063 Autoimmune thyroiditis: Secondary | ICD-10-CM | POA: Insufficient documentation

## 2024-02-09 DIAGNOSIS — Z79899 Other long term (current) drug therapy: Secondary | ICD-10-CM | POA: Diagnosis not present

## 2024-02-09 DIAGNOSIS — Z8249 Family history of ischemic heart disease and other diseases of the circulatory system: Secondary | ICD-10-CM | POA: Insufficient documentation

## 2024-02-09 DIAGNOSIS — Z01818 Encounter for other preprocedural examination: Secondary | ICD-10-CM | POA: Diagnosis not present

## 2024-02-09 DIAGNOSIS — I10 Essential (primary) hypertension: Secondary | ICD-10-CM | POA: Insufficient documentation

## 2024-02-09 DIAGNOSIS — Z955 Presence of coronary angioplasty implant and graft: Secondary | ICD-10-CM

## 2024-02-09 DIAGNOSIS — I25118 Atherosclerotic heart disease of native coronary artery with other forms of angina pectoris: Secondary | ICD-10-CM | POA: Insufficient documentation

## 2024-02-09 HISTORY — PX: LEFT HEART CATH AND CORONARY ANGIOGRAPHY: CATH118249

## 2024-02-09 HISTORY — PX: CORONARY STENT INTERVENTION: CATH118234

## 2024-02-09 LAB — GLUCOSE, CAPILLARY
Glucose-Capillary: 153 mg/dL — ABNORMAL HIGH (ref 70–99)
Glucose-Capillary: 100 mg/dL — ABNORMAL HIGH (ref 70–99)

## 2024-02-09 LAB — POCT ACTIVATED CLOTTING TIME
Activated Clotting Time: 262 s
Activated Clotting Time: 250 s

## 2024-02-09 SURGERY — LEFT HEART CATH AND CORONARY ANGIOGRAPHY
Anesthesia: LOCAL

## 2024-02-09 MED ORDER — IOHEXOL 350 MG/ML SOLN
INTRAVENOUS | Status: DC | PRN
  Administered 2024-02-09: 85 mL via INTRA_ARTERIAL

## 2024-02-09 MED ORDER — SODIUM CHLORIDE 0.9 % WEIGHT BASED INFUSION
3.0000 mL/kg/h | INTRAVENOUS | Status: AC
Start: 2024-02-09 — End: 2024-02-09

## 2024-02-09 MED ORDER — FENTANYL CITRATE (PF) 100 MCG/2ML IJ SOLN
INTRAMUSCULAR | Status: DC | PRN
  Administered 2024-02-09 (×2): 25 ug via INTRAVENOUS

## 2024-02-09 MED ORDER — NITROGLYCERIN 1 MG/10 ML FOR IR/CATH LAB
INTRA_ARTERIAL | Status: DC | PRN
  Administered 2024-02-09: 200 ug via INTRACORONARY

## 2024-02-09 MED ORDER — LIDOCAINE HCL (PF) 1 % IJ SOLN
INTRAMUSCULAR | Status: DC | PRN
  Administered 2024-02-09: 2 mL

## 2024-02-09 MED ORDER — MIDAZOLAM HCL 2 MG/2ML IJ SOLN
INTRAMUSCULAR | Status: AC
  Filled 2024-02-09: qty 2

## 2024-02-09 MED ORDER — VERAPAMIL HCL 2.5 MG/ML IV SOLN
INTRAVENOUS | Status: AC
  Filled 2024-02-09: qty 2

## 2024-02-09 MED ORDER — NITROGLYCERIN 1 MG/10 ML FOR IR/CATH LAB
INTRA_ARTERIAL | Status: AC
  Filled 2024-02-09: qty 10

## 2024-02-09 MED ORDER — ACETAMINOPHEN 325 MG PO TABS: 650.0000 mg | ORAL_TABLET | ORAL | Status: DC | PRN

## 2024-02-09 MED ORDER — MIDAZOLAM HCL 2 MG/2ML IJ SOLN
INTRAMUSCULAR | Status: DC | PRN
  Administered 2024-02-09: 1 mg via INTRAVENOUS
  Administered 2024-02-09: 2 mg via INTRAVENOUS

## 2024-02-09 MED ORDER — LABETALOL HCL 5 MG/ML IV SOLN: 10.0000 mg | INTRAVENOUS | Status: DC | PRN

## 2024-02-09 MED ORDER — CLOPIDOGREL BISULFATE 75 MG PO TABS
75.0000 mg | ORAL_TABLET | Freq: Every day | ORAL | 2 refills | Status: DC
  Filled 2024-02-09: qty 30, 30d supply, fill #0

## 2024-02-09 MED ORDER — SODIUM CHLORIDE 0.9 % IV SOLN: 250.0000 mL | INTRAVENOUS | Status: DC | PRN

## 2024-02-09 MED ORDER — SODIUM CHLORIDE 0.9 % WEIGHT BASED INFUSION: 1.0000 mL/kg/h | INTRAVENOUS | Status: DC

## 2024-02-09 MED ORDER — ASPIRIN 81 MG PO CHEW
81.0000 mg | CHEWABLE_TABLET | ORAL | Status: DC
Start: 2024-02-09 — End: 2024-02-09

## 2024-02-09 MED ORDER — LIDOCAINE HCL (PF) 1 % IJ SOLN
INTRAMUSCULAR | Status: AC
  Filled 2024-02-09: qty 30

## 2024-02-09 MED ORDER — SODIUM CHLORIDE 0.9% FLUSH
3.0000 mL | Freq: Two times a day (BID) | INTRAVENOUS | Status: DC
Start: 2024-02-10 — End: 2024-02-09

## 2024-02-09 MED ORDER — HEPARIN SODIUM (PORCINE) 1000 UNIT/ML IJ SOLN
INTRAMUSCULAR | Status: AC
  Filled 2024-02-09: qty 10

## 2024-02-09 MED ORDER — HYDRALAZINE HCL 20 MG/ML IJ SOLN: 10.0000 mg | INTRAMUSCULAR | Status: DC | PRN

## 2024-02-09 MED ORDER — FENTANYL CITRATE (PF) 100 MCG/2ML IJ SOLN
INTRAMUSCULAR | Status: AC
  Filled 2024-02-09: qty 2

## 2024-02-09 MED ORDER — VERAPAMIL HCL 2.5 MG/ML IV SOLN
INTRAVENOUS | Status: DC | PRN
  Administered 2024-02-09: 10 mL via INTRA_ARTERIAL

## 2024-02-09 MED ORDER — ASPIRIN 81 MG PO CHEW: 81.0000 mg | CHEWABLE_TABLET | ORAL | Status: DC

## 2024-02-09 MED ORDER — NITROGLYCERIN 0.4 MG SL SUBL
0.4000 mg | SUBLINGUAL_TABLET | SUBLINGUAL | 2 refills | Status: DC | PRN
  Filled 2024-02-09: qty 25, 8d supply, fill #0

## 2024-02-09 MED ORDER — HEPARIN (PORCINE) IN NACL 1000-0.9 UT/500ML-% IV SOLN
INTRAVENOUS | Status: DC | PRN
  Administered 2024-02-09 (×2): 500 mL

## 2024-02-09 MED ORDER — HEPARIN SODIUM (PORCINE) 1000 UNIT/ML IJ SOLN
INTRAMUSCULAR | Status: DC | PRN
  Administered 2024-02-09: 2000 [IU] via INTRAVENOUS
  Administered 2024-02-09 (×2): 5000 [IU] via INTRAVENOUS
  Administered 2024-02-09: 2000 [IU] via INTRAVENOUS

## 2024-02-09 MED ORDER — SODIUM CHLORIDE 0.9% FLUSH: 3.0000 mL | INTRAVENOUS | Status: DC | PRN

## 2024-02-09 MED ORDER — CLOPIDOGREL BISULFATE 300 MG PO TABS
ORAL_TABLET | ORAL | Status: DC | PRN
  Administered 2024-02-09: 600 mg via ORAL

## 2024-02-09 MED ORDER — CLOPIDOGREL BISULFATE 75 MG PO TABS: 75.0000 mg | ORAL_TABLET | Freq: Every day | ORAL | Status: DC

## 2024-02-09 MED ORDER — CLOPIDOGREL BISULFATE 300 MG PO TABS
ORAL_TABLET | ORAL | Status: AC
  Filled 2024-02-09: qty 2

## 2024-02-09 MED ORDER — ONDANSETRON HCL 4 MG/2ML IJ SOLN: 4.0000 mg | Freq: Four times a day (QID) | INTRAMUSCULAR | Status: DC | PRN

## 2024-02-09 SURGICAL SUPPLY — 16 items
BALLN EMERGE MR 2.25X12 (BALLOONS) ×1 IMPLANT
BALLOON EMERGE MR 2.25X12 (BALLOONS) IMPLANT
CATH INFINITI AMBI 5FR TG (CATHETERS) IMPLANT
CATH VISTA GUIDE 6FR XBLD 3.5 (CATHETERS) IMPLANT
DEVICE RAD COMP TR BAND LRG (VASCULAR PRODUCTS) IMPLANT
GLIDESHEATH SLEND SS 6F .021 (SHEATH) IMPLANT
GUIDEWIRE INQWIRE 1.5J.035X260 (WIRE) IMPLANT
INQWIRE 1.5J .035X260CM (WIRE) ×1 IMPLANT
KIT ENCORE 26 ADVANTAGE (KITS) IMPLANT
PACK CARDIAC CATHETERIZATION (CUSTOM PROCEDURE TRAY) ×1 IMPLANT
SET ATX-X65L (MISCELLANEOUS) IMPLANT
SHEATH PROBE COVER 6X72 (BAG) IMPLANT
STENT SYNERGY XD 2.50X12 (Permanent Stent) IMPLANT
SYNERGY XD 2.50X12 (Permanent Stent) ×1 IMPLANT
TUBING CIL FLEX 10 FLL-RA (TUBING) IMPLANT
WIRE ASAHI PROWATER 180CM (WIRE) IMPLANT

## 2024-02-09 NOTE — Interval H&P Note (Signed)
 History and Physical Interval Note:  02/09/2024 1:54 PM  [name]  has presented today for surgery, with the diagnosis of lad stenosis.  The various methods of treatment have been discussed with the patient and family. After consideration of risks, benefits and other options for treatment, the patient has consented to  Procedure(s): LEFT HEART CATH AND CORONARY ANGIOGRAPHY (N/A)  PERCUTANEOUS CORONARY INTERVENTION  as a surgical intervention.  The patient's history has been reviewed, patient examined, no change in status, stable for surgery.  I have reviewed the patient's chart and labs.  Questions were answered to the patient's satisfaction.    Cath Lab Visit (complete for each Cath Lab visit)  Clinical Evaluation Leading to the Procedure:   ACS: No.  Non-ACS:    Anginal Classification: CCS II  Anti-ischemic medical therapy: Minimal Therapy (1 class of medications)  Non-Invasive Test Results: High-risk stress test findings: cardiac mortality >3%/year  Prior CABG: No previous CABG     Bryan Lemma

## 2024-02-09 NOTE — Progress Notes (Signed)
 Discussed with pt and daughter stent, restrictions, Plavix importance, diet, exercise, NTG and CRPII. Pt receptive although struggles to exercise due to her vestibular dysfunction. Encouraged trying CRPII, will refer to [facility].  [phone] [name] BS, ACSM-CEP 02/09/2024 4:06 PM

## 2024-02-09 NOTE — Discharge Instructions (Signed)

## 2024-02-09 NOTE — Discharge Summary (Signed)
 Discharge Summary for Same Day PCI   Patient ID: [name] MRN: [id number]; DOB: 10/30/1971  Admit date: 02/09/2024 Discharge date: 02/09/2024  Primary Care Provider: Elfredia Nevins, MD  Primary Cardiologist: Marjo Bicker, MD  Primary Electrophysiologist:  None   Discharge Diagnoses    Active Problems:   CAD (coronary artery disease)  Diagnostic Studies/Procedures    Cardiac Catheterization 02/09/2024:    CULPRIT LESION: Mid LAD lesion is 80% stenosed.  TIMI-3 flow.   A drug-eluting stent was successfully placed using a SYNERGY XD 2.50X12 -> deployed to 2.7 mm.  Post intervention, there is a 0% residual stenosis with TIMI-3 flow maintained.   LV end diastolic pressure is normal.   There is no aortic valve stenosis.   Diagnostic: Dominance: Right                                                    Intervention                   Single-vessel disease with 80% mid LAD stenosis just after 3rd Diag treated successfully with Synergy XD 2.5 mm x 12 mm deployed to 2.7 mm. Otherwise normal LCx and RCA as well as as normal LVEDP with normal LV function by 2D Echocardiogram      RECOMMENDATIONS   In the absence of any other complications or medical issues, we expect the patient to be ready for discharge from an interventional cardiology perspective on 02/09/2024.   Recommend uninterrupted dual antiplatelet therapy with Aspirin 81mg  daily and Clopidogrel 75mg  daily for a minimum of 6 months (stable ischemic heart disease-Class I recommendation).   Would continue Plavix monotherapy (able to interrupt) to complete first year and then switch back to aspirin 81 mg daily     Bryan Lemma, MD _____________   History of Present Illness     [name] is a 53 y.o. female with PMH of HTN, diabetes, autoimmune thyroiditis who was initially seen in the clinic evaluation of chest pain.  She was having exertional chest pain (exertional activities include climbing stairs and walking her  dog) 2 times per week and lasting for minutes. Due to cardiac risk factors including HTN and insulin-dependent diabetes mellitus, she underwent CT cardiac and echocardiogram.  CT cardiac showed coronary calcium score of 67 (95th percentile for age and sex matched control), total plaque volume is 109, 75th percentile for age and sex matched control.  There was evidence of moderate soft plaque stenosis in the mid LAD followed by a severe soft plaque stenosis (70-99%). CT FFR analysis showed significant functional stenosis in the mid to distal LAD area.  Otherwise, there was mild nonobstructive CAD in the remainder of the vessels.  There was evidence of soft plaque in all the coronary arteries.  Echocardiogram showed normal LVEF and no valvular heart disease.   Patient seen in the clinic on 3/3 and reported she had been limiting her exertional activities (climbing stairs and walking her dog) to avoid chest pain.  No interval angina.  She was having chest pains but felt it was from anxiety/panic attacks.  She started Imdur but did not tolerate due to dizziness, headaches, other side effects.  No other symptoms of DOE, syncope, palpitations or leg swelling.  Former smoker.  Currently diabetes was very well under control.  HbA1c  7.2 recently.  Around 3 years ago, it was 49.  Currently on insulin for diabetes.  Lipid panel from September 24 showed TG 335 and LDL 73. Given recurrent symptoms, she was set up for outpatient cardiac cath and placed on metoprolol.   Hospital Course     The patient underwent cardiac cath as noted above with PCI/DES to mLAD. Plan for DAPT with ASA/plavix for at least 6 months. The patient was seen by cardiac rehab while in short stay. There were no observed complications post cath. Radial cath site was re-evaluated prior to discharge and found to be stable without any complications. Instructions/precautions regarding cath site care were given prior to discharge.  [name] was seen by  Dr. Herbie Baltimore and determined stable for discharge home. Follow up with our office has been arranged. Medications are listed below. Pertinent changes include addition of plavix. _____________  Cath/PCI Registry Performance & Quality Measures: Aspirin prescribed? - Yes ADP Receptor Inhibitor (Plavix/Clopidogrel, Brilinta/Ticagrelor or Effient/Prasugrel) prescribed (includes medically managed patients)? - Yes High Intensity Statin (Lipitor 40-80mg  or Crestor 20-40mg ) prescribed? - Yes For EF <40%, was ACEI/ARB prescribed? - Not Applicable (EF >/= 40%) For EF <40%, Aldosterone Antagonist (Spironolactone or Eplerenone) prescribed? - Not Applicable (EF >/= 40%) Cardiac Rehab Phase II ordered (Included Medically managed Patients)? - Yes  _____________   Discharge Vitals Blood pressure (!) 114/58, pulse 61, temperature 98.6 F (37 C), temperature source Oral, resp. rate 16, height 5\' 4"  (1.626 m), weight 97.5 kg, SpO2 94%.  Filed Weights   02/09/24 1039  Weight: 97.5 kg    Last Labs & Radiologic Studies    CBC No results for input(s): "WBC", "NEUTROABS", "HGB", "HCT", "MCV", "PLT" in the last 72 hours. Basic Metabolic Panel No results for input(s): "NA", "K", "CL", "CO2", "GLUCOSE", "BUN", "CREATININE", "CALCIUM", "MG", "PHOS" in the last 72 hours. Liver Function Tests No results for input(s): "AST", "ALT", "ALKPHOS", "BILITOT", "PROT", "ALBUMIN" in the last 72 hours. No results for input(s): "LIPASE", "AMYLASE" in the last 72 hours. High Sensitivity Troponin:   No results for input(s): "TROPONINIHS" in the last 720 hours.  BNP Invalid input(s): "POCBNP" D-Dimer No results for input(s): "DDIMER" in the last 72 hours. Hemoglobin A1C No results for input(s): "HGBA1C" in the last 72 hours. Fasting Lipid Panel No results for input(s): "CHOL", "HDL", "LDLCALC", "TRIG", "CHOLHDL", "LDLDIRECT" in the last 72 hours. Thyroid Function Tests No results for input(s): "TSH", "T4TOTAL", "T3FREE",  "THYROIDAB" in the last 72 hours.  Invalid input(s): "FREET3" _____________  CARDIAC CATHETERIZATION Result Date: 02/09/2024 Images from the original result were not included.   CULPRIT LESION: Mid LAD lesion is 80% stenosed.  TIMI-3 flow.   A drug-eluting stent was successfully placed using a SYNERGY XD 2.50X12 -> deployed to 2.7 mm.  Post intervention, there is a 0% residual stenosis with TIMI-3 flow maintained.   LV end diastolic pressure is normal.   There is no aortic valve stenosis. Diagnostic: Dominance: Right     Intervention    Single-vessel disease with 80% mid LAD stenosis just after 3rd Diag treated successfully with Synergy XD 2.5 mm x 12 mm deployed to 2.7 mm. Otherwise normal LCx and RCA as well as as normal LVEDP with normal LV function by 2D Echocardiogram RECOMMENDATIONS   In the absence of any other complications or medical issues, we expect the patient to be ready for discharge from an interventional cardiology perspective on 02/09/2024.   Recommend uninterrupted dual antiplatelet therapy with Aspirin 81mg  daily and  Clopidogrel 75mg  daily for a minimum of 6 months (stable ischemic heart disease-Class I recommendation).   Would continue Plavix monotherapy (able to interrupt) to complete first year and then switch back to aspirin 81 mg daily Bryan Lemma, MD  ECHOCARDIOGRAM COMPLETE Result Date: 01/31/2024    ECHOCARDIOGRAM REPORT   Patient Name:   [name] Date of Exam: 01/31/2024 Medical Rec #:  [id number]     Height:       64.5 in Accession #:    [id number]    Weight:       217.6 lb Date of Birth:  June 17, 1971    BSA:          2.039 m Patient Age:    52 years      BP:           112/69 mmHg Patient Gender: F             HR:           72 bpm. [facility] Procedure: 2D Echo, Cardiac Doppler and Color Doppler (Both Spectral and Color            Flow Doppler were utilized during procedure). Indications:    LAD Stenosis  History:        Patient has no prior history of  Echocardiogram examinations.                 Risk Factors:Hypertension, Diabetes and Dyslipidemia.  Sonographer:    Celesta Gentile RCS Referring Phys: [id number] VISHNU P MALLIPEDDI IMPRESSIONS  1. Left ventricular ejection fraction, by estimation, is 65 to 70%. The left ventricle has normal function. The left ventricle has no regional wall motion abnormalities. There is mild concentric left ventricular hypertrophy. Left ventricular diastolic parameters are indeterminate.  2. Right ventricular systolic function is normal. The right ventricular size is normal. Tricuspid regurgitation signal is inadequate for assessing PA pressure.  3. The mitral valve is grossly normal. No evidence of mitral valve regurgitation.  4. The aortic valve is tricuspid. Aortic valve regurgitation is not visualized. No aortic stenosis is present. Aortic valve mean gradient measures 5.0 mmHg.  5. The inferior vena cava is normal in size with greater than 50% respiratory variability, suggesting right atrial pressure of 3 mmHg. Comparison(s): No prior Echocardiogram. FINDINGS  Left Ventricle: Left ventricular ejection fraction, by estimation, is 65 to 70%. The left ventricle has normal function. The left ventricle has no regional wall motion abnormalities. Strain imaging was not performed. The left ventricular internal cavity  size was normal in size. There is mild concentric left ventricular hypertrophy. Left ventricular diastolic parameters are indeterminate. Right Ventricle: The right ventricular size is normal. No increase in right ventricular wall thickness. Right ventricular systolic function is normal. Tricuspid regurgitation signal is inadequate for assessing PA pressure. Left Atrium: Left atrial size was normal in size. Right Atrium: Right atrial size was normal in size. Pericardium: Trivial pericardial effusion is present. The pericardial effusion is posterior to the left ventricle. Presence of epicardial fat layer. Mitral Valve: The  mitral valve is grossly normal. No evidence of mitral valve regurgitation. Tricuspid Valve: The tricuspid valve is grossly normal. Tricuspid valve regurgitation is trivial. Aortic Valve: The aortic valve is tricuspid. There is mild aortic valve annular calcification. Aortic valve regurgitation is not visualized. No aortic stenosis is present. Aortic valve mean gradient measures 5.0 mmHg. Aortic valve peak gradient measures 10.8 mmHg. Aortic valve area, by VTI measures 1.89 cm. Pulmonic Valve: The  pulmonic valve was grossly normal. Pulmonic valve regurgitation is trivial. Aorta: The aortic root is normal in size and structure. Venous: The inferior vena cava is normal in size with greater than 50% respiratory variability, suggesting right atrial pressure of 3 mmHg. IAS/Shunts: No atrial level shunt detected by color flow Doppler. Additional Comments: 3D imaging was not performed.  LEFT VENTRICLE PLAX 2D LVIDd:         4.60 cm   Diastology LVIDs:         2.70 cm   LV e' medial:    6.09 cm/s LV PW:         1.10 cm   LV E/e' medial:  10.5 LV IVS:        1.20 cm   LV e' lateral:   7.29 cm/s LVOT diam:     1.80 cm   LV E/e' lateral: 8.8 LV SV:         69 LV SV Index:   34 LVOT Area:     2.54 cm  RIGHT VENTRICLE RV S prime:     11.10 cm/s TAPSE (M-mode): 1.8 cm LEFT ATRIUM             Index        RIGHT ATRIUM           Index LA diam:        3.80 cm 1.86 cm/m   RA Area:     13.30 cm LA Vol (A2C):   42.3 ml 20.75 ml/m  RA Volume:   28.10 ml  13.78 ml/m LA Vol (A4C):   43.0 ml 21.09 ml/m LA Biplane Vol: 43.3 ml 21.24 ml/m  AORTIC VALVE AV Area (Vmax):    1.82 cm AV Area (Vmean):   1.92 cm AV Area (VTI):     1.89 cm AV Vmax:           164.00 cm/s AV Vmean:          107.000 cm/s AV VTI:            0.368 m AV Peak Grad:      10.8 mmHg AV Mean Grad:      5.0 mmHg LVOT Vmax:         117.00 cm/s LVOT Vmean:        80.900 cm/s LVOT VTI:          0.273 m LVOT/AV VTI ratio: 0.74  AORTA Ao Root diam: 3.40 cm MITRAL VALVE MV  Area (PHT): 2.83 cm    SHUNTS MV Decel Time: 268 msec    Systemic VTI:  0.27 m MV E velocity: 63.80 cm/s  Systemic Diam: 1.80 cm MV A velocity: 67.90 cm/s MV E/A ratio:  0.94 Nona Dell MD Electronically signed by Nona Dell MD Signature Date/Time: 01/31/2024/12:57:50 PM    Final    CT CORONARY FFR DATA PREP & FLUID ANALYSIS Result Date: 01/26/2024 EXAM: CT-FFR ANALYSIS CLINICAL DATA:  Possibly obstructive coronary lesion(s): Mid LAD, mid-distal LAD FINDINGS: CT-FFR analysis was performed on the original cardiac CT angiogram dataset. Diagrammatic representation of the CT-FFR analysis is provided in a separate PDF document in PACS. This dictation was created using the PDF document and an interactive 3D model of the results. 3D model is not available in the EMR/PACS. Normal FFR range is >0.80. 1. Left Main: No significant functional stenosis 2. LAD: Significant functional stenosis, CT-FFR 0.92 at mid LAD lesion but 0.75 at mid-distal LAD lesion 3. LCX: No significant functional stenosis 4. RCA:  No significant functional stenosis. IMPRESSION: 1. CT FFR analysis shows significant functional stenosis in the mid-distal LAD. Riley Lam MD Electronically Signed   By: Riley Lam M.D.   On: 01/26/2024 16:23   CT CORONARY MORPH W/CTA COR W/SCORE W/CA W/CM &/OR WO/CM Result Date: 01/26/2024 CLINICAL DATA:  53 Year-old Female EXAM: Cardiac/Coronary  CTA TECHNIQUE: The patient was scanned on a [employer]. FINDINGS: Scan was triggered in the descending thoracic aorta. Axial non-contrast 3 mm slices were carried out through the heart. The data set was analyzed on a dedicated work station and scored using the Agatson method. Gantry rotation speed was 250 msecs and collimation was .6 mm. 0.8 mg of sl NTG was given. The 3D data set was reconstructed in 5% intervals of the 67-82 % of the R-R cycle. Diastolic phases were analyzed on a dedicated work station using MPR, MIP and VRT modes.  The patient received 100 cc of contrast. Coronary Arteries:  Normal coronary origin.  Right dominance. Coronary Calcium Score: Left main: 0 Left anterior descending artery: 66 Left circumflex artery: 0 Right coronary artery: 1 Total: 67 Percentile: 95th for age, sex, and race matched control. Plaque Analysis: Left main: 3 cubic mm soft plaque Left anterior descending artery: 12 Cubic mm calcified plaque, 93 cubic mm non calcified plaque, 11 cubic mm low attenuation plaque, 105 cubic mm total plaque burden. Right coronary artery: 0 Cubic mm calcified plaque, 4 cubic mm non calcified plaque, 0 cubic mm low attenuation plaque, 4 cubic mm total plaque burden. Total: 109 cubic mm RCA is a large dominant artery that gives rise to PDA and PLA. Minimal non-obstructive mixed plaque (1-24%) in the mid RCA. Left main is a large artery that gives rise to LAD and LCX arteries. There is no significant plaque. LAD is a large vessel that gives rise to two diagonals. Mild non-obstructive calcified plaque (25-49%) in the proximal LAD. Moderate soft plaque stenosis mid LAD followed by a severe soft plaque stenosis (70-99%). There is low attenuation plaque (high risk feature). Mild disease proximal D2 and mid D2. LCX is a non-dominant artery that gives rise to one large OM1 branch. Minimal non-obstructive soft plaque (1-24%) in the proximal LCX. Mild non-obstructive soft plaque (25-49%) in the proximal OM1. Other non-coronary findings: Aorta: Normal size.  Aortic atherosclerosis.  No dissection. Main Pulmonary Artery: Normal size of the pulmonary artery. Systemic Veins: Normal drainage Normal pulmonary vein drainage into the left atrium. Normal left atrial appendage without a thrombus. Interatrial septum suggestive of small PFO. Chamber dimensions: Normal. Aortic Valve:  Tri-leaflet.  No calcifications. Mitral valve: No calcifications. Pericardium: Normal thickness Extra-cardiac findings: See attached radiology report for non-cardiac  structures. Artifact: Body attenuation artifact Image quality: Good IMPRESSION: 1. Coronary calcium score of 67. This was 95th percentile for age, sex, and race matched control. Total plaque volume 109. This is near the 75th percentile for age and gender. 2. Normal coronary origin with right dominance. 3. CAD-RADS 4a Severe stenosis. (70-99%). High-Risk Plaque Modifier- low attenuation plaque. CT FFR will be sent. Consider symptom-guided anti-ischemic pharmacotherapy as well as risk factor modification per guideline directed care. RECOMMENDATIONS: RECOMMENDATIONS The proposed cut-off value of 1,651 AU yielded a 93 % sensitivity and 75 % specificity in grading AS severity in patients with classical low-flow, low-gradient AS. Proposed different cut-off values to define severe AS for men and women as 2,065 AU and 1,274 AU, respectively. The joint European and American recommendations for the assessment of AS consider  the aortic valve calcium score as a continuum - a very high calcium score suggests severe AS and a low calcium score suggests severe AS is unlikely. Sunday Shams, et al. 2017 ESC/EACTS Guidelines for the management of valvular heart disease. Eur Heart J 2017;38:2739-91. Coronary artery calcium (CAC) score is a strong predictor of incident coronary heart disease (CHD) and provides predictive information beyond traditional risk factors. CAC scoring is reasonable to use in the decision to withhold, postpone, or initiate statin therapy in intermediate-risk or selected borderline-risk asymptomatic adults (age 59-75 years and LDL-C >=70 to <190 mg/dL) who do not have diabetes or established atherosclerotic cardiovascular disease (ASCVD).* In intermediate-risk (10-year ASCVD risk >=7.5% to <20%) adults or selected borderline-risk (10-year ASCVD risk >=5% to <7.5%) adults in whom a CAC score is measured for the purpose of making a treatment decision the following recommendations have been made: If  CAC = 0, it is reasonable to withhold statin therapy and reassess in 5 to 10 years, as long as higher risk conditions are absent (diabetes mellitus, family history of premature CHD in first degree relatives (males <55 years; females <65 years), cigarette smoking, LDL >=190 mg/dL or other independent risk factors). If CAC is 1 to 99, it is reasonable to initiate statin therapy for patients >=72 years of age. If CAC is >=100 or >=75th percentile, it is reasonable to initiate statin therapy at any age. Cardiology referral should be considered for patients with CAC scores =400 or >=75th percentile. *2018 AHA/ACC/AACVPR/AAPA/ABC/ACPM/ADA/AGS/APhA/ASPC/NLA/PCNA Guideline on the Management of Blood Cholesterol: A Report of the American College of Cardiology/American Heart Association Task Force on Clinical Practice Guidelines. J Am Coll Cardiol. 2019;73(24):3168-3209. Riley Lam, MD Electronically Signed   By: Riley Lam M.D.   On: 01/26/2024 16:19    Disposition   Pt is being discharged home today in good condition.  Follow-up Plans & Appointments     Discharge Instructions     AMB Referral to Cardiac Rehabilitation - Phase II   Complete by: As directed    Diagnosis:  Stable Angina Coronary Stents     After initial evaluation and assessments completed: Virtual Based Care may be provided alone or in conjunction with Phase 2 Cardiac Rehab based on patient barriers.: Yes   Intensive Cardiac Rehabilitation (ICR) [facility] location only OR Traditional Cardiac Rehabilitation (TCR) *If criteria for ICR are not met will enroll in TCR [facility] only): Yes   Amb Referral to Cardiac Rehabilitation   Complete by: As directed    Diagnosis:  Coronary Stents PTCA     After initial evaluation and assessments completed: Virtual Based Care may be provided alone or in conjunction with Phase 2 Cardiac Rehab based on patient barriers.: Yes   Intensive Cardiac Rehabilitation (ICR) [facility] location only OR  Traditional Cardiac Rehabilitation (TCR) *If criteria for ICR are not met will enroll in TCR [facility] only): Yes        Discharge Medications   Allergies as of 02/09/2024       Reactions   Aspirin    Upset stomach   Sulfa Antibiotics Hives   Sulfamethoxazole-trimethoprim Rash        Medication List     TAKE these medications    ALPRAZolam 0.5 MG tablet Commonly known as: XANAX Take 0.5 mg by mouth 2 (two) times daily as needed for anxiety (vertigo).   aspirin EC 81 MG tablet Take 1 tablet (81 mg total) by mouth daily. Swallow whole.   atorvastatin 40  MG tablet Commonly known as: LIPITOR Take 1 tablet (40 mg total) by mouth daily.   B-D UF III MINI PEN NEEDLES 31G X 5 MM Misc Generic drug: Insulin Pen Needle USE AND DISCARD 1 PEN      NEEDLE IN THE MORNING, AT   NOON, IN THE EVENING, AND  AT BEDTIME   B-D UF III MINI PEN NEEDLES 31G X 5 MM Misc Generic drug: Insulin Pen Needle USE AND DISCARD 1 PEN      NEEDLE 3 TIMES A DAY FOR   SUBCUTANEOUS INJECTION   B-D UF III MINI PEN NEEDLES 31G X 5 MM Misc Generic drug: Insulin Pen Needle Use to inject insulin 4 times daily   blood glucose meter kit and supplies Kit Dispense based on patient and insurance preference. Use up to four times daily as directed.   butalbital-acetaminophen-caffeine 50-325-40 MG tablet Commonly known as: FIORICET Take 1 tablet by mouth 2 (two) times daily as needed for headache.   clopidogrel 75 MG tablet Commonly known as: Plavix Take 1 tablet (75 mg total) by mouth daily.   diazepam 2 MG tablet Commonly known as: VALIUM Take 2 mg by mouth 2 (two) times daily as needed for anxiety.   diphenhydrAMINE 25 MG tablet Commonly known as: BENADRYL Take 25 mg by mouth daily as needed for allergies.   DULoxetine 30 MG capsule Commonly known as: CYMBALTA Take 30 mg by mouth 2 (two) times daily.   fenofibrate micronized 200 MG capsule Commonly known as: LOFIBRA Take 200 mg by mouth daily before  breakfast.   fluconazole 100 MG tablet Commonly known as: DIFLUCAN Take 100 mg by mouth daily as needed (Yeast).   gabapentin 800 MG tablet Commonly known as: NEURONTIN Take 800 mg by mouth 4 (four) times daily.   glimepiride 4 MG tablet Commonly known as: AMARYL TAKE 1 TABLET DAILY   HYDROcodone-acetaminophen 10-325 MG tablet Commonly known as: NORCO Take 1 tablet by mouth 3 (three) times daily as needed for severe pain (pain score 7-10).   hydrOXYzine 25 MG tablet Commonly known as: ATARAX Take 25 mg by mouth 4 (four) times daily as needed for itching.   lisinopril 2.5 MG tablet Commonly known as: ZESTRIL Take 2.5 mg by mouth daily.   meclizine 25 MG tablet Commonly known as: ANTIVERT Take 25 mg by mouth 4 (four) times daily as needed for dizziness.   Melatonin 10 MG Tabs Take 10 mg by mouth at bedtime as needed (sleep).   metoprolol tartrate 25 MG tablet Commonly known as: LOPRESSOR Take 1 tablet (25 mg total) by mouth 2 (two) times daily.   nitroGLYCERIN 0.4 MG SL tablet Commonly known as: NITROSTAT Place 1 tablet (0.4 mg total) under the tongue every 5 (five) minutes as needed.   NovoLOG FlexPen 100 UNIT/ML FlexPen Generic drug: insulin aspart Inject 5-11 Units into the skin 3 (three) times daily with meals. What changed: how much to take   OneTouch Delica Plus Lancet33G Misc Apply 1 each topically 4 (four) times daily.   OneTouch Verio test strip Generic drug: glucose blood 1 each 4 (four) times daily.   Synthroid 175 MCG tablet Generic drug: levothyroxine TAKE 1 TABLET DAILY   Toujeo Max SoloStar 300 UNIT/ML Solostar Pen Generic drug: insulin glargine (2 Unit Dial) Inject 54 Units into the skin at bedtime. What changed: how much to take   traMADol 50 MG tablet Commonly known as: ULTRAM Take 100 mg by mouth 3 (three) times daily as needed for  moderate pain.   VITAMIN D PO Take 1 capsule by mouth daily.           Allergies Allergies   Allergen Reactions   Aspirin     Upset stomach    Sulfa Antibiotics Hives   Sulfamethoxazole-Trimethoprim Rash    Outstanding Labs/Studies   N/a   Duration of Discharge Encounter   Greater than 30 minutes including physician time.  Signed, Laverda Page, NP 02/09/2024, 5:05 PM

## 2024-02-09 NOTE — Progress Notes (Signed)
 TR band removed at 1815, gauze dressing applied. Right radial level 0, clean, dry, and intact. Patient walked to the bathroom without difficulties.

## 2024-02-11 ENCOUNTER — Encounter (HOSPITAL_COMMUNITY): Payer: Self-pay | Admitting: Cardiology

## 2024-02-13 ENCOUNTER — Ambulatory Visit: Payer: BC Managed Care – PPO | Admitting: General Surgery

## 2024-02-14 ENCOUNTER — Other Ambulatory Visit: Payer: Self-pay | Admitting: Nurse Practitioner

## 2024-02-20 ENCOUNTER — Encounter: Payer: Self-pay | Admitting: Internal Medicine

## 2024-02-20 ENCOUNTER — Other Ambulatory Visit: Payer: Self-pay

## 2024-02-20 MED ORDER — ATORVASTATIN CALCIUM 40 MG PO TABS
40.0000 mg | ORAL_TABLET | Freq: Every day | ORAL | 3 refills | Status: DC
Start: 2024-02-20 — End: 2024-03-04

## 2024-02-27 ENCOUNTER — Other Ambulatory Visit: Payer: Self-pay | Admitting: Nurse Practitioner

## 2024-02-28 ENCOUNTER — Other Ambulatory Visit: Payer: Self-pay

## 2024-02-28 MED ORDER — CLOPIDOGREL BISULFATE 75 MG PO TABS: 75.0000 mg | ORAL_TABLET | Freq: Every day | ORAL | 2 refills | Status: DC

## 2024-03-04 ENCOUNTER — Other Ambulatory Visit: Payer: Self-pay

## 2024-03-04 MED ORDER — CLOPIDOGREL BISULFATE 75 MG PO TABS: 75.0000 mg | ORAL_TABLET | Freq: Every day | ORAL | 2 refills | Status: DC

## 2024-03-04 MED ORDER — ATORVASTATIN CALCIUM 40 MG PO TABS: 40.0000 mg | ORAL_TABLET | Freq: Every day | ORAL | 3 refills | Status: DC

## 2024-03-04 MED ORDER — CLOPIDOGREL BISULFATE 75 MG PO TABS
75.0000 mg | ORAL_TABLET | Freq: Every day | ORAL | 2 refills | Status: DC
Start: 2024-03-04 — End: 2024-03-14

## 2024-03-06 ENCOUNTER — Encounter: Payer: Self-pay | Admitting: Nurse Practitioner

## 2024-03-06 MED ORDER — METOPROLOL TARTRATE 25 MG PO TABS: 25.0000 mg | ORAL_TABLET | Freq: Two times a day (BID) | ORAL | 3 refills | Status: DC

## 2024-03-14 ENCOUNTER — Encounter: Payer: Self-pay | Admitting: Internal Medicine

## 2024-03-14 ENCOUNTER — Ambulatory Visit: Attending: Internal Medicine | Admitting: Internal Medicine

## 2024-03-14 VITALS — BP 114/70 | HR 57 | Ht 64.5 in | Wt 218.4 lb

## 2024-03-14 DIAGNOSIS — E1169 Type 2 diabetes mellitus with other specified complication: Secondary | ICD-10-CM | POA: Diagnosis not present

## 2024-03-14 DIAGNOSIS — I2510 Atherosclerotic heart disease of native coronary artery without angina pectoris: Secondary | ICD-10-CM | POA: Diagnosis not present

## 2024-03-14 DIAGNOSIS — I10 Essential (primary) hypertension: Secondary | ICD-10-CM

## 2024-03-14 DIAGNOSIS — Z794 Long term (current) use of insulin: Secondary | ICD-10-CM

## 2024-03-14 DIAGNOSIS — E7800 Pure hypercholesterolemia, unspecified: Secondary | ICD-10-CM

## 2024-03-14 DIAGNOSIS — E781 Pure hyperglyceridemia: Secondary | ICD-10-CM

## 2024-03-14 MED ORDER — CLOPIDOGREL BISULFATE 75 MG PO TABS: 75.0000 mg | ORAL_TABLET | Freq: Every day | ORAL | 2 refills | Status: DC

## 2024-03-14 MED ORDER — ASPIRIN 81 MG PO TBEC: 81.0000 mg | DELAYED_RELEASE_TABLET | Freq: Every day | ORAL | 5 refills | Status: AC

## 2024-03-14 NOTE — Progress Notes (Addendum)
 Cardiology Office Note  Date: 03/14/2024   ID: Neile, Nuding 19-May-1971, MRN [id number]  PCP:  Kathyleen Parkins, MD  Cardiologist:  Lasalle Pointer, MD Electrophysiologist:  None   History of Present Illness: [name] is a 52 y.o. female known to have HTN, insulin-dependent diabetes mellitus type 2, autoimmune thyroiditis on thyroid  supplements is here for follow-up visit.  Patient was initially referred to cardiology clinic for evaluation of chest pain.  She was having exertional chest pain (exertional activities include climbing stairs and walking her dog) 2 times per week and lasting for minutes. Due to cardiac risk factors including HTN and insulin-dependent diabetes mellitus, she underwent CT cardiac and echocardiogram.  CT cardiac showed coronary calcium  score of 67 (95th percentile for age and sex matched control), total plaque volume is 109, 75th percentile for age and sex matched control.  There was evidence of moderate soft plaque stenosis in the mid LAD followed by a severe soft plaque stenosis (70-99%).  There is low-attenuation plaque over here.  CT FFR analysis showed significant functional stenosis in the mid to distal LAD area.  Otherwise, there is mild nonobstructive CAD in the remainder of the vessels.  There is evidence of soft plaque in all the coronary arteries.  Echocardiogram showed normal LVEF and no valvular heart disease.  She underwent LHC in March 2025 and received LAD PCI.   Doing great.  No angina.  Although she has chest pains with anxiety which has been the case for many years.  Low stamina levels for many years.  She gets short of breath with household chores which is chronic. Dizziness x years, worse on some days compared to other. No syncope, leg swelling.  Compliant with medications.   Past Medical History:  Diagnosis Date   Anxiety    BV (bacterial vaginosis)    BV   Cancer (HCC) 2009   papillary thyroid  cancer   Central vestibular  vertigo    Depression    Diabetes mellitus without complication (HCC)    Family history of breast cancer    Hyperlipidemia    Hypertension    Hypothyroidism    IBS (irritable bowel syndrome)    Thyroid  disease     Past Surgical History:  Procedure Laterality Date   ABLATION     CARPAL TUNNEL RELEASE     CARPAL TUNNEL RELEASE Right    CESAREAN SECTION     CORONARY STENT INTERVENTION N/A 02/09/2024   Procedure: CORONARY STENT INTERVENTION;  Surgeon: Arleen Lacer, MD;  [facility]: [facility];  Service: Cardiovascular;  Laterality: N/A;   CYST EXCISION N/A 09/12/2022   Procedure: CYST REMOVAL, NECK;  Surgeon: Awilda Bogus, MD;  Location: [facility];  Service: General;  Laterality: N/A;   LEFT HEART CATH AND CORONARY ANGIOGRAPHY N/A 02/09/2024   Procedure: LEFT HEART CATH AND CORONARY ANGIOGRAPHY;  Surgeon: Arleen Lacer, MD;  Location: [facility] INVASIVE CV LAB;  Service: Cardiovascular;  Laterality: N/A;   LIPOMA EXCISION Left 09/12/2022   Procedure: EXCISION LIPOMA, THIGH;  Surgeon: Awilda Bogus, MD;  Location: [facility];  Service: General;  Laterality: Left;   NECK AND CHEST LESION     THYROIDECTOMY     Right and Left, 05/2008, 09/2008   WISDOM TOOTH EXTRACTION      Current Outpatient Medications  Medication Sig Dispense Refill   ALPRAZolam (XANAX) 0.5 MG tablet Take 0.5 mg by mouth 2 (two) times daily as needed for anxiety (vertigo).  aspirin  EC 81 MG tablet Take 1 tablet (81 mg total) by mouth daily. Swallow whole. 30 tablet 5   atorvastatin  (LIPITOR) 40 MG tablet Take 1 tablet (40 mg total) by mouth daily. 30 tablet 3   B-D UF III MINI PEN NEEDLES 31G X 5 MM MISC USE AND DISCARD 1 PEN      NEEDLE IN THE MORNING, AT   NOON, IN THE EVENING, AND  AT BEDTIME 100 each 0   B-D UF III MINI PEN NEEDLES 31G X 5 MM MISC USE AND DISCARD 1 PEN      NEEDLE 3 TIMES A DAY FOR   SUBCUTANEOUS INJECTION 270 each 1   B-D UF III MINI PEN NEEDLES 31G X 5 MM MISC Use to inject insulin 4  times daily 300 each 3   blood glucose meter kit and supplies KIT Dispense based on patient and insurance preference. Use up to four times daily as directed. 1 each 0   butalbital -acetaminophen -caffeine  (FIORICET) 50-325-40 MG tablet Take 1 tablet by mouth 2 (two) times daily as needed for headache.     clopidogrel  (PLAVIX ) 75 MG tablet Take 1 tablet (75 mg total) by mouth daily. 90 tablet 2   diazepam (VALIUM) 2 MG tablet Take 2 mg by mouth 2 (two) times daily as needed for anxiety.     diphenhydrAMINE (BENADRYL) 25 MG tablet Take 25 mg by mouth daily as needed for allergies.     DULoxetine (CYMBALTA) 30 MG capsule Take 30 mg by mouth 2 (two) times daily.     fenofibrate micronized (LOFIBRA) 200 MG capsule Take 200 mg by mouth daily before breakfast.     fluconazole  (DIFLUCAN ) 100 MG tablet Take 100 mg by mouth daily as needed (Yeast).     gabapentin (NEURONTIN) 800 MG tablet Take 800 mg by mouth 4 (four) times daily.     glimepiride  (AMARYL ) 4 MG tablet TAKE 1 TABLET DAILY 30 tablet 1   HYDROcodone-acetaminophen  (NORCO) 10-325 MG tablet Take 1 tablet by mouth 3 (three) times daily as needed for severe pain (pain score 7-10).     hydrOXYzine (ATARAX/VISTARIL) 25 MG tablet Take 25 mg by mouth 4 (four) times daily as needed for itching.     insulin aspart  (NOVOLOG  FLEXPEN) 100 UNIT/ML FlexPen Inject 5-11 Units into the skin 3 (three) times daily with meals. (Patient taking differently: Inject 8-11 Units into the skin 3 (three) times daily with meals.) 30 mL 3   insulin glargine , 2 Unit Dial, (TOUJEO  MAX SOLOSTAR) 300 UNIT/ML Solostar Pen Inject 55 Units into the skin at bedtime. 18 mL 0   Lancets (ONETOUCH DELICA PLUS LANCET33G) MISC Apply 1 each topically 4 (four) times daily.     lisinopril (ZESTRIL) 2.5 MG tablet Take 2.5 mg by mouth daily.     meclizine (ANTIVERT) 25 MG tablet Take 25 mg by mouth 4 (four) times daily as needed for dizziness.     Melatonin 10 MG TABS Take 10 mg by mouth at  bedtime as needed (sleep).     metoprolol  tartrate (LOPRESSOR ) 25 MG tablet Take 1 tablet (25 mg total) by mouth 2 (two) times daily. 180 tablet 3   nitroGLYCERIN  (NITROSTAT ) 0.4 MG SL tablet Place 1 tablet (0.4 mg total) under the tongue every 5 (five) minutes as needed. 25 tablet 2   ONETOUCH VERIO test strip 1 each 4 (four) times daily.     SYNTHROID  175 MCG tablet TAKE 1 TABLET DAILY 90 tablet 0   traMADol (ULTRAM)  50 MG tablet Take 100 mg by mouth 3 (three) times daily as needed for moderate pain.     VITAMIN D  PO Take 1 capsule by mouth daily.     No current facility-administered medications for this visit.   Allergies:  Aspirin , Sulfa antibiotics, and Sulfamethoxazole-trimethoprim   Social History: The patient  reports that she has quit smoking. She has never used smokeless tobacco. She reports current alcohol use. She reports that she does not use drugs.   Family History: The patient's family history includes Breast cancer in her maternal grandmother and mother; Diabetes in her father; Hyperlipidemia in her father; Hypertension in her father; Lung cancer in her maternal uncle; Mental illness in her mother.   ROS:  Please see the history of present illness. Otherwise, complete review of systems is positive for none  All other systems are reviewed and negative.   Physical Exam: VS:  BP 114/70   Pulse (!) 57   Ht 5' 4.5" (1.638 m)   Wt 218 lb 6.4 oz (99.1 kg)   SpO2 99%   BMI 36.91 kg/m , BMI Body mass index is 36.91 kg/m.  Wt Readings from Last 3 Encounters:  03/14/24 218 lb 6.4 oz (99.1 kg)  02/09/24 215 lb (97.5 kg)  02/05/24 213 lb 3.2 oz (96.7 kg)    General: Patient appears comfortable at rest. HEENT: Conjunctiva and lids normal Neck: Supple Lungs: Clear to auscultation, nonlabored breathing at rest. Cardiac: Regular rate and rhythm, no S3 or significant systolic murmur, no pericardial rub. Musculoskeletal: No kyphosis. Neuropsychiatric: Alert and oriented x3, affect  grossly appropriate.  Recent Labwork: 08/14/2023: ALT 45; AST 20; TSH 5.630 02/06/2024: BUN 17; Creatinine, Ser 1.02; Hemoglobin 12.7; Platelets 192; Potassium 4.2; Sodium 139     Component Value Date/Time   CHOL 151 02/06/2024 0834   CHOL 161 08/14/2023 0841   TRIG 148 02/06/2024 0834   HDL 45 02/06/2024 0834   HDL 34 (L) 08/14/2023 0841   CHOLHDL 3.4 02/06/2024 0834   VLDL 30 02/06/2024 0834   LDLCALC 76 02/06/2024 0834   LDLCALC 73 08/14/2023 0841     Assessment and Plan:   CAD manifested by SIHD with imaging evidence of flow-limiting mid to distal LAD disease s/p LAD PCI in 02/09/2024: No angina but she has chest pains with anxiety.  Provided reassurance.  Continue DAPT, aspirin  81 mg once daily, Plavix  75 mg once daily.  Continue atorvastatin  40 mg nightly, fenofibrate 200 mg once daily.  Continue metoprolol  titrate 25 mg twice daily.  ER precautions for chest pain provided.  She has a history of thyroid  cancer, will avoid Ozempic.  Echocardiogram showed normal LVEF and no valvular heart disease.  HLD, hypertriglyceridemia: Lipid panel from March 2024 I reviewed, TG 148 and LDL 76.  Both within goal.  Continue current medications, atorvastatin  40 mg nightly and fenofibrate 200 mg once daily.  HTN, controlled: Continue lisinopril 2.5 mg once daily.  Addendum on 04/18/2024 Insulin-dependent diabetes type 2: On insulin, follows with PCP.  HbA1c 7.2.  She has a history of papillary thyroid  cancer and underwent total thyroidectomy more than 10 years ago.  I reviewed the thyroid  imaging that showed evidence of total thyroidectomy with no thyroid  bed.  I reviewed literature that showed safety of GLP-1 agonist in patients with total thyroidectomy.  Will start Ozempic due to the presence of DM 2.  Start Ozempic 0.25 mg/week x 4 weeks, followed by 0.5 mg /week x 4 weeks, followed by 1 mg /week  x 4 weeks and then 2 mg/week. Nurse visit for Ozempic use.      Medication Adjustments/Labs and Tests  Ordered: Current medicines are reviewed at length with the patient today.  Concerns regarding medicines are outlined above.    Disposition:  Follow up 1 month after LHC  Signed Gearlene Godsil Beauford Bounds, MD, 03/14/2024 8:52 AM    [facility] at [facility] [address] [place], [place], [place] [address]

## 2024-03-14 NOTE — Patient Instructions (Addendum)

## 2024-03-19 ENCOUNTER — Other Ambulatory Visit: Payer: Self-pay

## 2024-03-19 MED ORDER — NITROGLYCERIN 0.4 MG SL SUBL: 0.4000 mg | SUBLINGUAL_TABLET | SUBLINGUAL | 2 refills | Status: AC | PRN

## 2024-03-24 ENCOUNTER — Other Ambulatory Visit: Payer: Self-pay | Admitting: Nurse Practitioner

## 2024-03-28 ENCOUNTER — Encounter: Payer: Self-pay | Admitting: General Surgery

## 2024-03-28 ENCOUNTER — Ambulatory Visit: Admitting: General Surgery

## 2024-03-28 VITALS — BP 132/57 | HR 75 | Temp 98.8°F | Resp 16 | Ht 64.5 in | Wt 219.0 lb

## 2024-03-28 DIAGNOSIS — N6001 Solitary cyst of right breast: Secondary | ICD-10-CM

## 2024-03-28 DIAGNOSIS — D229 Melanocytic nevi, unspecified: Secondary | ICD-10-CM

## 2024-03-28 NOTE — Patient Instructions (Addendum)
 Will remove the areas on Monday 04/01/24.

## 2024-03-28 NOTE — Progress Notes (Unsigned)
 [facility] History and Physical  Reason for Referral:*** Referring Physician: ***  Chief Complaint   Cholelithiasis     [name] is a 53 y.o. female.  HPI:   Discussed the use of AI scribe software for clinical note transcription with the patient, who gave verbal consent to proceed.  History of Present Illness      ***.  The *** started *** and has had a duration of ***.  It is associated with ***.  The *** is improved with ***, and is made worse with ***.    Quality*** Context***  Past Medical History:  Diagnosis Date   Anxiety    BV (bacterial vaginosis)    BV   Cancer (HCC) 2009   papillary thyroid  cancer   Central vestibular vertigo    Depression    Diabetes mellitus without complication (HCC)    Family history of breast cancer    Hyperlipidemia    Hypertension    Hypothyroidism    IBS (irritable bowel syndrome)    Thyroid  disease     Past Surgical History:  Procedure Laterality Date   ABLATION     CARPAL TUNNEL RELEASE     CARPAL TUNNEL RELEASE Right    CESAREAN SECTION     CORONARY STENT INTERVENTION N/A 02/09/2024   Procedure: CORONARY STENT INTERVENTION;  Surgeon: Arleen Lacer, MD;  Location: [facility] INVASIVE CV LAB;  Service: Cardiovascular;  Laterality: N/A;   CYST EXCISION N/A 09/12/2022   Procedure: CYST REMOVAL, NECK;  Surgeon: Awilda Bogus, MD;  Location: [facility];  Service: General;  Laterality: N/A;   LEFT HEART CATH AND CORONARY ANGIOGRAPHY N/A 02/09/2024   Procedure: LEFT HEART CATH AND CORONARY ANGIOGRAPHY;  Surgeon: Arleen Lacer, MD;  Location: [facility] INVASIVE CV LAB;  Service: Cardiovascular;  Laterality: N/A;   LIPOMA EXCISION Left 09/12/2022   Procedure: EXCISION LIPOMA, THIGH;  Surgeon: Awilda Bogus, MD;  Location: [facility];  Service: General;  Laterality: Left;   NECK AND CHEST LESION     THYROIDECTOMY     Right and Left, 05/2008, 09/2008   WISDOM TOOTH EXTRACTION      Family History  Problem Relation  Age of Onset   Breast cancer Mother        died at age 63   Mental illness Mother    Hypertension Father    Hyperlipidemia Father    Diabetes Father    Breast cancer Maternal Grandmother        died in her late 49s   Lung cancer Maternal Uncle     Social History   Tobacco Use   Smoking status: Former   Smokeless tobacco: Never  Vaping Use   Vaping status: Never Used  Substance Use Topics   Alcohol use: Yes    Comment: occ   Drug use: Never    Medications: {medication reviewed/display:3041432} Allergies as of 03/28/2024       Reactions   Aspirin     Upset stomach   Sulfa Antibiotics Hives   Sulfamethoxazole-trimethoprim Rash        Medication List        Accurate as of March 28, 2024  1:49 PM. If you have any questions, ask your nurse or doctor.          ALPRAZolam 0.5 MG tablet Commonly known as: XANAX Take 0.5 mg by mouth 2 (two) times daily as needed for anxiety (vertigo).   aspirin  EC 81 MG tablet Take 1 tablet (81  mg total) by mouth daily. Swallow whole.   atorvastatin  40 MG tablet Commonly known as: LIPITOR Take 1 tablet (40 mg total) by mouth daily.   B-D UF III MINI PEN NEEDLES 31G X 5 MM Misc Generic drug: Insulin Pen Needle USE AND DISCARD 1 PEN      NEEDLE IN THE MORNING, AT   NOON, IN THE EVENING, AND  AT BEDTIME   B-D UF III MINI PEN NEEDLES 31G X 5 MM Misc Generic drug: Insulin Pen Needle USE AND DISCARD 1 PEN      NEEDLE 3 TIMES A DAY FOR   SUBCUTANEOUS INJECTION   B-D UF III MINI PEN NEEDLES 31G X 5 MM Misc Generic drug: Insulin Pen Needle Use to inject insulin 4 times daily   blood glucose meter kit and supplies Kit Dispense based on patient and insurance preference. Use up to four times daily as directed.   butalbital -acetaminophen -caffeine  50-325-40 MG tablet Commonly known as: FIORICET Take 1 tablet by mouth 2 (two) times daily as needed for headache.   clopidogrel  75 MG tablet Commonly known as: Plavix  Take 1 tablet (75  mg total) by mouth daily.   diazepam 2 MG tablet Commonly known as: VALIUM Take 2 mg by mouth 2 (two) times daily as needed for anxiety.   diphenhydrAMINE 25 MG tablet Commonly known as: BENADRYL Take 25 mg by mouth daily as needed for allergies.   DULoxetine 30 MG capsule Commonly known as: CYMBALTA Take 30 mg by mouth 2 (two) times daily.   fenofibrate micronized 200 MG capsule Commonly known as: LOFIBRA Take 200 mg by mouth daily before breakfast.   fluconazole  100 MG tablet Commonly known as: DIFLUCAN  Take 100 mg by mouth daily as needed (Yeast).   gabapentin 800 MG tablet Commonly known as: NEURONTIN Take 800 mg by mouth 4 (four) times daily.   glimepiride  4 MG tablet Commonly known as: AMARYL  TAKE 1 TABLET DAILY   HYDROcodone-acetaminophen  10-325 MG tablet Commonly known as: NORCO Take 1 tablet by mouth 3 (three) times daily as needed for severe pain (pain score 7-10).   hydrOXYzine 25 MG tablet Commonly known as: ATARAX Take 25 mg by mouth 4 (four) times daily as needed for itching.   lisinopril 2.5 MG tablet Commonly known as: ZESTRIL Take 2.5 mg by mouth daily.   meclizine 25 MG tablet Commonly known as: ANTIVERT Take 25 mg by mouth 4 (four) times daily as needed for dizziness.   Melatonin 10 MG Tabs Take 10 mg by mouth at bedtime as needed (sleep).   metoprolol  tartrate 25 MG tablet Commonly known as: LOPRESSOR  Take 1 tablet (25 mg total) by mouth 2 (two) times daily.   nitroGLYCERIN  0.4 MG SL tablet Commonly known as: NITROSTAT  Place 1 tablet (0.4 mg total) under the tongue every 5 (five) minutes as needed.   NovoLOG  FlexPen 100 UNIT/ML FlexPen Generic drug: insulin aspart  Inject 8-14 Units into the skin 3 (three) times daily with meals.   OneTouch Delica Plus Lancet33G Misc Apply 1 each topically 4 (four) times daily.   OneTouch Verio test strip Generic drug: glucose blood 1 each 4 (four) times daily.   Synthroid  175 MCG tablet Generic  drug: levothyroxine  TAKE 1 TABLET DAILY   Toujeo  Max SoloStar 300 UNIT/ML Solostar Pen Generic drug: insulin glargine  (2 Unit Dial) Inject 55 Units into the skin at bedtime.   traMADol 50 MG tablet Commonly known as: ULTRAM Take 100 mg by mouth 3 (three) times daily as needed for  moderate pain.   VITAMIN D  PO Take 1 capsule by mouth daily.         ROS:  {Review of Systems:30496}  Blood pressure (!) 132/57, pulse 75, temperature 98.8 F (37.1 C), temperature source Oral, resp. rate 16, height 5' 4.5" (1.638 m), weight 219 lb (99.3 kg), SpO2 96%. Physical Exam Physical Exam   Results: No results found for this or any previous visit (from the past 48 hours).  No results found.   Assessment and Plan: Assessment and Plan Assessment & Plan      [name] is a 53 y.o. female with *** -*** -*** -Follow up ***  All questions were answered to the satisfaction of the patient and family***.  The risk and benefits of *** were discussed including but not limited to ***.  After careful consideration, [name] has decided to ***.   Future Appointments  Date Time Provider Department Center  04/01/2024 10:00 AM Awilda Bogus, MD [facility] None    Awilda Bogus 03/28/2024, 1:49 PM

## 2024-04-01 ENCOUNTER — Ambulatory Visit (INDEPENDENT_AMBULATORY_CARE_PROVIDER_SITE_OTHER): Admitting: General Surgery

## 2024-04-01 ENCOUNTER — Encounter: Payer: Self-pay | Admitting: General Surgery

## 2024-04-01 DIAGNOSIS — N6001 Solitary cyst of right breast: Secondary | ICD-10-CM | POA: Diagnosis not present

## 2024-04-01 DIAGNOSIS — L820 Inflamed seborrheic keratosis: Secondary | ICD-10-CM | POA: Diagnosis not present

## 2024-04-01 DIAGNOSIS — L720 Epidermal cyst: Secondary | ICD-10-CM | POA: Diagnosis not present

## 2024-04-01 DIAGNOSIS — D225 Melanocytic nevi of trunk: Secondary | ICD-10-CM | POA: Diagnosis not present

## 2024-04-01 DIAGNOSIS — D229 Melanocytic nevi, unspecified: Secondary | ICD-10-CM

## 2024-04-01 NOTE — Progress Notes (Signed)
 [facility] Procedure Note  04/01/24  Pre-procedure Diagnosis:  Right breast skin cyst, left abdominal wall mole    Post-procedure Diagnosis: Same   Procedure(s) Performed: Excision of right breast skin cyst 1.5cm, full thickness skin biopsy of left abdominal wall mole    Surgeon: Dixon Fredrickson. Collene Dawson, MD   Assistants: No qualified resident was available    Anesthesia: Lidocaine  1%    Specimens:  Right breast cyst, mole    Estimated Blood Loss: Minimal  Wound Class: Clean    Procedure Indications: Ms. Sayre is a 53 yo who has a recurrent right medial breast cyst that has been inflamed and required antibiotic treatment. She had prior excision in the area with recurrence. She also has a growing mole that she has noticed on her left abdominal wall. We discussed excision of both and risk of bleeding, infection, recurrence, finding something unexpected.   Findings: Right breast skin cyst, left abdominal wall mole 6mm in size    Procedure: The patient was taken to the procedure room and placed supine. The right breast medially was prepared and draped in the usual sterile fashion. Lidocaine  1% was injected. An elliptical incision was made around the prior scar and carried down to the subcutaneous tissue with sharp dissection with scissors.  The cyst and the old scar were removed in their entirety. The cyst measured about 1.5cm in size. The cavity was cauterized with silver nitrate stick. Irrigation was performed. The cavity was closed with 3-0 Vicryl interrupted and the skin was closed with 3-0 Nylon interrupted. Neosporin and a bandage were placed.   Attention was then turned to the mole on the left abdomen. An ellipse around the mole (6mm) was made and carried down to the subcutaneous tissue. A full thickness skin biopsy was done to remove the lesion. The area was closed with 3-0 Vicryl interrupted and 3-0 Nylon interrupted. Neosporin and a bandage were placed.    Final  inspection revealed acceptable hemostasis. The patient tolerated the procedure well.   Keep bandage on for 48 hrs. If the bandage comes off, put neosporin on the area and cover back with bandage. On Wednesday you can shower and get area wet and for the next 5- 7 days I would keep neosporin on area and cover with bandage.   Call with issues. Take tylenol  or ibuprofen for pain  Future Appointments  Date Time Provider Department Center  04/11/2024  2:45 PM Awilda Bogus, MD [facility] None     Deena Farrier, MD [facility] [address] [place], [place] [address] [phone] (office)

## 2024-04-01 NOTE — Patient Instructions (Signed)
 Keep bandage on for 48 hrs. If the bandage comes off, put neosporin on the area and cover back with bandage. On Wednesday you can shower and get area wet and for the next 5- 7 days I would keep neosporin on area and cover with bandage.   Call with issues. Take tylenol  or ibuprofen for pain

## 2024-04-02 NOTE — Addendum Note (Signed)
 Addended by: Valere Gata B on: 04/02/2024 09:27 AM   Modules accepted: Orders

## 2024-04-03 ENCOUNTER — Other Ambulatory Visit: Payer: Self-pay | Admitting: Nurse Practitioner

## 2024-04-04 LAB — TISSUE PATH REPORT

## 2024-04-04 LAB — PATHOLOGY REPORT

## 2024-04-04 NOTE — Progress Notes (Signed)
 Let patient know she had a inflamed ruptured cyst and a seborrheic keratosis.

## 2024-04-11 ENCOUNTER — Encounter: Payer: Self-pay | Admitting: General Surgery

## 2024-04-11 ENCOUNTER — Ambulatory Visit: Admitting: General Surgery

## 2024-04-11 VITALS — BP 115/73 | HR 58 | Temp 98.1°F | Resp 12 | Ht 64.5 in | Wt 221.0 lb

## 2024-04-11 DIAGNOSIS — T8131XA Disruption of external operation (surgical) wound, not elsewhere classified, initial encounter: Secondary | ICD-10-CM

## 2024-04-11 DIAGNOSIS — N6001 Solitary cyst of right breast: Secondary | ICD-10-CM

## 2024-04-11 MED ORDER — AMOXICILLIN-POT CLAVULANATE 875-125 MG PO TABS: 1.0000 | ORAL_TABLET | Freq: Two times a day (BID) | ORAL | 0 refills | Status: AC

## 2024-04-11 NOTE — Progress Notes (Signed)
 [facility]  Having drainage from the breast incision. Described as dark blood but some grey still from the silver nitrate.   BP 115/73   Pulse (!) 58   Temp 98.1 F (36.7 C) (Oral)   Resp 12   Ht 5' 4.5" (1.638 m)   Wt 221 lb (100.2 kg)   SpO2 97%   BMI 37.35 kg/m  Breast incision with some minor redness, mid suture with some red brown discharge Sutures removed and mid incision dehiscence noted with old hematoma mixed with greyish drainage from silver nitrate stick, irrigated and packed area Abd incision c/d/I with minor irritation, suture removed and steri strips placed  Patient s/p excision of inflamed ruptured superficial breast cyst and abdominal wall seborrheic keratosis.   Pack area daily after flushing with saline. The skin may open up more and this is ok.  Ok to shower and pack area after showering. Do the packing at least once a day but you can do it twice a day if you like or if needed for showering.  Take the antibiotic as prescribed given the minor redness on the breast  Keep area covered with gauze to catch any drainage.   Future Appointments  Date Time Provider Department Center  04/17/2024 11:00 AM Awilda Bogus, MD [facility] None   Deena Farrier, MD [facility] [place] [address] [place], [place] [address] [phone] (office)

## 2024-04-11 NOTE — Patient Instructions (Signed)
 Pack area daily after flushing with saline. The skin may open up more and this is ok.  Ok to shower and pack area after showering. Do the packing at least once a day but you can do it twice a day if you like or if needed for showering.  Take the antibiotic as prescribed. Keep area covered with gauze to catch any drainage.

## 2024-04-17 ENCOUNTER — Ambulatory Visit (INDEPENDENT_AMBULATORY_CARE_PROVIDER_SITE_OTHER): Admitting: General Surgery

## 2024-04-17 ENCOUNTER — Encounter: Payer: Self-pay | Admitting: General Surgery

## 2024-04-17 ENCOUNTER — Encounter: Payer: Self-pay | Admitting: Internal Medicine

## 2024-04-17 VITALS — BP 121/71 | HR 53 | Temp 98.2°F | Resp 14 | Ht 64.5 in | Wt 223.0 lb

## 2024-04-17 DIAGNOSIS — T8131XA Disruption of external operation (surgical) wound, not elsewhere classified, initial encounter: Secondary | ICD-10-CM

## 2024-04-17 DIAGNOSIS — N6001 Solitary cyst of right breast: Secondary | ICD-10-CM

## 2024-04-17 NOTE — Progress Notes (Signed)
 [facility]  Packing area. No issues.  BP 121/71   Pulse (!) 53   Temp 98.2 F (36.8 C) (Oral)   Resp 14   Ht 5' 4.5" (1.638 m)   Wt 223 lb (101.2 kg)   SpO2 95%   BMI 37.69 kg/m  Packing replaced right medial breast, no erythema or drainage, about size pea   Patient s/p cyst excision with dehiscence of the wound.   Continue packing area daily to twice daily. Call with issues. In the next 1.5-2 weeks I would think it would be shallow enough to stop packing and do neosporin and bandage until it is completely closed up.  Any issues or questions, give us  a call.   Deena Farrier, MD [facility] [address] [place], [place] [address] [phone] (office)

## 2024-04-17 NOTE — Patient Instructions (Signed)
 Continue packing area daily to twice daily. Call with issues. In the next 1.5-2 weeks I would think it would be shallow enough to stop packing and do neosporin and bandage until it is completely closed up.  Any issues or questions, give us  a call.

## 2024-04-19 ENCOUNTER — Other Ambulatory Visit: Payer: Self-pay | Admitting: Internal Medicine

## 2024-04-19 ENCOUNTER — Telehealth: Payer: Self-pay | Admitting: Pharmacy Technician

## 2024-04-19 MED ORDER — SEMAGLUTIDE(0.25 OR 0.5MG/DOS) 2 MG/3ML ~~LOC~~ SOPN: 0.2500 mg | PEN_INJECTOR | SUBCUTANEOUS | 0 refills | Status: AC

## 2024-04-19 NOTE — Telephone Encounter (Signed)
 PAP: Patient assistance application for Ozempic through [employer] has been mailed to pt's home address on file. Provider portion of application will be faxed to provider's office.

## 2024-04-30 ENCOUNTER — Encounter: Payer: Self-pay | Admitting: Nurse Practitioner

## 2024-05-06 MED ORDER — OMNIPOD 5 DEXG7G6 INTRO GEN 5 KIT: PACK | 0 refills | Status: DC

## 2024-05-06 MED ORDER — OMNIPOD 5 DEXG7G6 PODS GEN 5 MISC: 11 refills | Status: DC

## 2024-05-09 NOTE — Telephone Encounter (Signed)
 Called the patient to follow up since we have not heard back about their application

## 2024-05-13 NOTE — Telephone Encounter (Signed)
 Patient said she never got application. Mailed again 05/13/24. Called lmom and sent her mychart back

## 2024-05-15 ENCOUNTER — Encounter: Payer: Self-pay | Admitting: Adult Health

## 2024-05-15 ENCOUNTER — Ambulatory Visit (INDEPENDENT_AMBULATORY_CARE_PROVIDER_SITE_OTHER): Admitting: Adult Health

## 2024-05-15 VITALS — BP 133/65 | HR 72 | Ht 64.5 in | Wt 220.0 lb

## 2024-05-15 DIAGNOSIS — N3941 Urge incontinence: Secondary | ICD-10-CM | POA: Diagnosis not present

## 2024-05-15 DIAGNOSIS — R102 Pelvic and perineal pain: Secondary | ICD-10-CM

## 2024-05-15 DIAGNOSIS — N812 Incomplete uterovaginal prolapse: Secondary | ICD-10-CM

## 2024-05-15 NOTE — Progress Notes (Signed)
  Subjective:     Patient ID: [name], female   DOB: 1971-08-13, 53 y.o.   MRN: [id number]  HPI [name] is a 53 year old white female, married, G2P2002 in complaining of low abdominal pain and pain in vagina for about 1.5 weeks. She has had urge incontinence twice now in last 4-5 days when gets up at night to pee. Feels like rip or tearing in low abdomen when pees but no pain peeing.     Component Value Date/Time   DIAGPAP  10/20/2023 0850    - Negative for intraepithelial lesion or malignancy (NILM)   DIAGPAP  01/02/2017 0000    NEGATIVE FOR INTRAEPITHELIAL LESIONS OR MALIGNANCY.   HPVHIGH Negative 10/20/2023 0850   ADEQPAP  10/20/2023 0850    Satisfactory for evaluation; transformation zone component PRESENT.   ADEQPAP  01/02/2017 0000    Satisfactory for evaluation  endocervical/transformation zone component PRESENT.    PCP is Dr Lewayne Records  Review of Systems +Low abdominal pain  + pain in vagina for about 1.5 weeks. + urge incontinence twice now in last 4-5 days when gets up at night to pee.  Feels like rip or tearing in low abdomen when pees but no pain peeing.   Denies any pain with sex Reviewed past medical,surgical, social and family history. Reviewed medications and allergies.  Objective:   Physical Exam BP 133/65 (BP Location: Left Arm, Patient Position: Sitting, Cuff Size: Large)   Pulse 72   Ht 5' 4.5 (1.638 m)   Wt 220 lb (99.8 kg)   BMI 37.18 kg/m  urine dipstick 2+ glucose Skin warm and dry.Pelvic: external genitalia is normal in appearance no lesions, vagina: pink and moist, has  first degree uterine descensus, urethra has no lesions or masses noted, cervix:smooth and bulbous, uterus: normal size, shape and contour, + tender, no masses felt, adnexa: no masses, LLQ tenderness noted. Bladder is non tender and no masses felt. Fall risk is moderate  Upstream - 05/15/24 1437       Pregnancy Intention Screening   Does the patient want to become pregnant in the next  year? No    Does the patient's partner want to become pregnant in the next year? No    Would the patient like to discuss contraceptive options today? No      Contraception Wrap Up   Current Method Female Sterilization    End Method Female Sterilization    Contraception Counseling Provided No            Examination chaperoned by Alphonso Aschoff LPN     Assessment:     1. Vaginal pain  2. Pelvic pain (Primary)  low abdominal pain and pain in vagina for about 1.5 weeks. Feels like rip or tearing in low abdomen when pees but no pain peeing. Will get pelvic US  to assess uterus and ovaries 05/22/24 at [facility] at 3:30 pm  - US  PELVIC COMPLETE WITH TRANSVAGINAL; Future - Urine Culture - Urinalysis, Routine w reflex microscopic  3. First degree uterine prolapse Did discuss if worsened could try pessary   4. Urge incontinence +UI  has had twice in last 4-5 days when getting up at night to pee  Will get UA C&S to rule out UTI  - Urine Culture - Urinalysis, Routine w reflex microscopic     Plan:     Follow up TBD

## 2024-05-16 LAB — URINALYSIS, ROUTINE W REFLEX MICROSCOPIC
Bilirubin, UA: NEGATIVE
Ketones, UA: NEGATIVE
Leukocytes,UA: NEGATIVE
Nitrite, UA: NEGATIVE
Protein,UA: NEGATIVE
RBC, UA: NEGATIVE
Specific Gravity, UA: >=1.03 — AB (ref 1.005–1.030)
Urobilinogen, Ur: 0.2 mg/dL (ref 0.2–1.0)
pH, UA: 7 (ref 5.0–7.5)

## 2024-05-17 ENCOUNTER — Ambulatory Visit: Payer: Self-pay | Admitting: Adult Health

## 2024-05-17 ENCOUNTER — Encounter: Payer: Self-pay | Admitting: Pharmacy Technician

## 2024-05-17 MED ORDER — NITROFURANTOIN MONOHYD MACRO 100 MG PO CAPS: 100.0000 mg | ORAL_CAPSULE | Freq: Two times a day (BID) | ORAL | 0 refills | Status: AC

## 2024-05-17 NOTE — Telephone Encounter (Signed)
 Lmom to check to see if patient got application

## 2024-05-17 NOTE — Telephone Encounter (Signed)
 Patient called back. She said she has not gotten the application yet

## 2024-05-17 NOTE — Telephone Encounter (Signed)
 error

## 2024-05-17 NOTE — Telephone Encounter (Signed)
 Pt aware that I am sending in antibiotic called Macrobid, urine preliminary result, + gram negative rod

## 2024-05-18 LAB — URINE CULTURE

## 2024-05-22 ENCOUNTER — Ambulatory Visit (HOSPITAL_COMMUNITY)

## 2024-05-22 ENCOUNTER — Encounter (INDEPENDENT_AMBULATORY_CARE_PROVIDER_SITE_OTHER): Payer: Self-pay

## 2024-05-27 ENCOUNTER — Encounter: Payer: Self-pay | Admitting: Pharmacy Technician

## 2024-05-27 NOTE — Telephone Encounter (Signed)
 Sent the patient a message to see if she got the application

## 2024-05-28 NOTE — Telephone Encounter (Signed)
 Patient wrote back saying she received the application and will work on it soon

## 2024-05-29 ENCOUNTER — Encounter (INDEPENDENT_AMBULATORY_CARE_PROVIDER_SITE_OTHER): Payer: Self-pay

## 2024-06-11 ENCOUNTER — Other Ambulatory Visit (HOSPITAL_COMMUNITY): Payer: Self-pay

## 2024-06-11 ENCOUNTER — Telehealth: Payer: Self-pay | Admitting: Pharmacy Technician

## 2024-06-11 NOTE — Telephone Encounter (Signed)
 Pharmacy Patient Advocate Encounter   Received notification from Onbase that prior authorization for ozempic  is required/requested.   Insurance verification completed.   The patient is insured through [employer] .   Per test claim: PA required; PA submitted to above mentioned insurance via latent Key/confirmation #/EOC [id number] Status is pending

## 2024-06-11 NOTE — Telephone Encounter (Signed)
 Sent another message about ozempic  application

## 2024-06-11 NOTE — Telephone Encounter (Signed)
 Patient has insurance now

## 2024-06-11 NOTE — Telephone Encounter (Signed)
 Pharmacy Patient Advocate Encounter  Received notification from [employer] that Prior Authorization for ozempic  0.25mg  has been APPROVED from 06/11/24 to 12/04/24   Hi, the patient has been approved for ozempic . She was trying to get assistance but now she has insurance. Can someone send in the prescription for ozempic  now to her pharmacy? Thank you

## 2024-06-12 MED ORDER — SEMAGLUTIDE (1 MG/DOSE) 4 MG/3ML ~~LOC~~ SOPN
1.0000 mg | PEN_INJECTOR | SUBCUTANEOUS | 2 refills | Status: AC
Start: 2024-08-13 — End: ?

## 2024-06-12 MED ORDER — SEMAGLUTIDE(0.25 OR 0.5MG/DOS) 2 MG/3ML ~~LOC~~ SOPN: 0.2500 mg | PEN_INJECTOR | SUBCUTANEOUS | 0 refills | Status: AC

## 2024-06-12 MED ORDER — SEMAGLUTIDE(0.25 OR 0.5MG/DOS) 2 MG/3ML ~~LOC~~ SOPN: 0.5000 mg | PEN_INJECTOR | SUBCUTANEOUS | 0 refills | Status: AC

## 2024-06-12 NOTE — Addendum Note (Signed)
 Addended by: Bradan Congrove on: 06/12/2024 02:25 PM   Modules accepted: Orders

## 2024-06-12 NOTE — Telephone Encounter (Signed)
 Medication sent and left patient detailed voicemail advised patient to speak with pharmacy on instructions to use medication

## 2024-06-14 ENCOUNTER — Other Ambulatory Visit (HOSPITAL_COMMUNITY): Payer: Self-pay

## 2024-06-14 ENCOUNTER — Telehealth: Payer: Self-pay | Admitting: Pharmacy Technician

## 2024-06-14 NOTE — Telephone Encounter (Signed)
 Signed and faxed back.

## 2024-06-14 NOTE — Telephone Encounter (Signed)
Application submitted

## 2024-06-14 NOTE — Telephone Encounter (Signed)
Application submitted online

## 2024-06-14 NOTE — Telephone Encounter (Signed)
 Hi, scanned in media under BLANK OZEMPIC  PROVIDER FORM is the blank provider portion for ozempic . Can someone please get the provider to sign and fax back to us  at [phone]? Thank you!       Her cost is 246.00 for 1 month of ozempic  at the pharmacy. She is filling out the patient portion of application online.

## 2024-06-14 NOTE — Telephone Encounter (Signed)
 Hi, scanned in media under BLANK OZEMPIC  PROVIDER FORM is the blank provider portion for ozempic . Can someone please get the provider to sign and fax back to us  at [phone]? Thank you!            Her cost is 246.00 for 1 month of ozempic  at the pharmacy. She is filling out the patient portion of application online.    Sent this in pa encounter

## 2024-06-18 NOTE — Telephone Encounter (Signed)
 [facility] called and they have her application  [id number] they said should here in 2 days

## 2024-06-21 ENCOUNTER — Telehealth: Payer: Self-pay | Admitting: Pharmacy Technician

## 2024-06-21 ENCOUNTER — Encounter: Payer: Self-pay | Admitting: Internal Medicine

## 2024-06-21 MED ORDER — DEXCOM G7 SENSOR MISC: 1.0000 | 3 refills | Status: AC

## 2024-06-21 NOTE — Addendum Note (Signed)
 Addended by: Kadijah Shamoon J on: 06/21/2024 11:28 AM   Modules accepted: Orders

## 2024-06-25 MED ORDER — LEVOTHYROXINE SODIUM 175 MCG PO TABS
175.0000 ug | ORAL_TABLET | Freq: Every day | ORAL | 1 refills | Status: DC
Start: 2024-06-25 — End: 2024-09-06

## 2024-06-25 NOTE — Addendum Note (Signed)
 Addended by: Arshiya Jakes J on: 06/25/2024 06:45 AM   Modules accepted: Orders

## 2024-06-26 MED ORDER — CLOPIDOGREL BISULFATE 75 MG PO TABS: 75.0000 mg | ORAL_TABLET | Freq: Every day | ORAL | 2 refills | Status: DC

## 2024-06-26 MED ORDER — METOPROLOL TARTRATE 25 MG PO TABS: 25.0000 mg | ORAL_TABLET | Freq: Two times a day (BID) | ORAL | 3 refills | Status: DC

## 2024-07-01 DIAGNOSIS — M2550 Pain in unspecified joint: Secondary | ICD-10-CM | POA: Diagnosis not present

## 2024-07-01 DIAGNOSIS — F411 Generalized anxiety disorder: Secondary | ICD-10-CM | POA: Diagnosis not present

## 2024-07-01 DIAGNOSIS — M159 Polyosteoarthritis, unspecified: Secondary | ICD-10-CM | POA: Diagnosis not present

## 2024-07-01 DIAGNOSIS — E1165 Type 2 diabetes mellitus with hyperglycemia: Secondary | ICD-10-CM | POA: Diagnosis not present

## 2024-07-02 MED ORDER — OMNIPOD 5 DEXG7G6 PODS GEN 5 MISC: 11 refills | Status: DC

## 2024-07-02 MED ORDER — GLIMEPIRIDE 4 MG PO TABS: 4.0000 mg | ORAL_TABLET | Freq: Every day | ORAL | 1 refills | Status: DC

## 2024-07-02 NOTE — Telephone Encounter (Signed)
 Pt called in about this message per Corean, CPhT. Pt gets this med from [facility] and was told it would be shipped to [facility] office in 10-14 days. Please advise if it has been delivered

## 2024-07-02 NOTE — Addendum Note (Signed)
 Addended by: Calyx Hawker J on: 07/02/2024 10:34 AM   Modules accepted: Orders

## 2024-07-03 ENCOUNTER — Other Ambulatory Visit (HOSPITAL_COMMUNITY): Payer: Self-pay

## 2024-07-03 ENCOUNTER — Telehealth: Payer: Self-pay | Admitting: Pharmacy Technician

## 2024-07-03 NOTE — Telephone Encounter (Signed)
 The patient called to check on status of shipment   They said it went into process yesterday and the patient should get at her doctor office at [address], [place] in 10-14 business days. They said there is no tracking number as of yet but it will ship out overnight and then a tracking number will be provided. [facility]OV[facility] NORDISK [phone]

## 2024-07-08 ENCOUNTER — Telehealth: Payer: Self-pay | Admitting: Nurse Practitioner

## 2024-07-08 NOTE — Telephone Encounter (Signed)
 Opened in error

## 2024-07-12 ENCOUNTER — Telehealth: Payer: Self-pay | Admitting: Pharmacy Technician

## 2024-07-12 NOTE — Telephone Encounter (Signed)
 I called on 07/03/24 and they said 10-14 business days. I sent the patient a message. Then she wrote saying she wanted another update since the first letter came to her in mid July-they sent a letter 06/21/24.   I called 07/12/24- ozempic  is in the process to be shipped still -no tracking number as of yet. They said this did not process until 07/02/24. Still in the 10-14 business day window, sent patient message

## 2024-07-23 ENCOUNTER — Emergency Department (HOSPITAL_COMMUNITY)
Admission: EM | Admit: 2024-07-23 | Discharge: 2024-07-23 | Disposition: A | Discharge status: Home / Self Care | Attending: Emergency Medicine | Admitting: Emergency Medicine

## 2024-07-23 ENCOUNTER — Emergency Department (HOSPITAL_COMMUNITY)

## 2024-07-23 ENCOUNTER — Other Ambulatory Visit: Payer: Self-pay

## 2024-07-23 ENCOUNTER — Encounter (HOSPITAL_COMMUNITY): Payer: Self-pay | Admitting: Emergency Medicine

## 2024-07-23 DIAGNOSIS — Z79899 Other long term (current) drug therapy: Secondary | ICD-10-CM | POA: Insufficient documentation

## 2024-07-23 DIAGNOSIS — Z7982 Long term (current) use of aspirin: Secondary | ICD-10-CM | POA: Diagnosis not present

## 2024-07-23 DIAGNOSIS — E1165 Type 2 diabetes mellitus with hyperglycemia: Secondary | ICD-10-CM | POA: Diagnosis not present

## 2024-07-23 DIAGNOSIS — L03211 Cellulitis of face: Secondary | ICD-10-CM | POA: Insufficient documentation

## 2024-07-23 DIAGNOSIS — I10 Essential (primary) hypertension: Secondary | ICD-10-CM | POA: Insufficient documentation

## 2024-07-23 DIAGNOSIS — J342 Deviated nasal septum: Secondary | ICD-10-CM | POA: Diagnosis not present

## 2024-07-23 DIAGNOSIS — K047 Periapical abscess without sinus: Secondary | ICD-10-CM | POA: Diagnosis not present

## 2024-07-23 DIAGNOSIS — R220 Localized swelling, mass and lump, head: Secondary | ICD-10-CM | POA: Diagnosis not present

## 2024-07-23 DIAGNOSIS — J320 Chronic maxillary sinusitis: Secondary | ICD-10-CM | POA: Diagnosis not present

## 2024-07-23 DIAGNOSIS — R7989 Other specified abnormal findings of blood chemistry: Secondary | ICD-10-CM | POA: Insufficient documentation

## 2024-07-23 DIAGNOSIS — Z794 Long term (current) use of insulin: Secondary | ICD-10-CM | POA: Diagnosis not present

## 2024-07-23 LAB — COMPREHENSIVE METABOLIC PANEL WITH GFR
ALT: 40 U/L (ref 0–44)
AST: 21 U/L (ref 15–41)
Albumin: 4.3 g/dL (ref 3.5–5.0)
Alkaline Phosphatase: 40 U/L (ref 38–126)
Anion gap: 16 — ABNORMAL HIGH (ref 5–15)
BUN: 16 mg/dL (ref 6–20)
CO2: 25 mmol/L (ref 22–32)
Calcium: 9.1 mg/dL (ref 8.9–10.3)
Chloride: 96 mmol/L — ABNORMAL LOW (ref 98–111)
Creatinine, Ser: 1.07 mg/dL — ABNORMAL HIGH (ref 0.44–1.00)
GFR, Estimated: >60 mL/min (ref >60)
Glucose, Bld: 242 mg/dL — ABNORMAL HIGH (ref 70–99)
Potassium: 4.4 mmol/L (ref 3.5–5.1)
Sodium: 137 mmol/L (ref 135–145)
Total Bilirubin: 0.9 mg/dL (ref 0.0–1.2)
Total Protein: 8.1 g/dL (ref 6.5–8.1)

## 2024-07-23 LAB — CBC WITH DIFFERENTIAL/PLATELET
Abs Immature Granulocytes: 0.03 K/uL (ref 0.00–0.07)
Basophils Absolute: 0.1 K/uL (ref 0.0–0.1)
Basophils Relative: 1 %
Eosinophils Absolute: 0 K/uL (ref 0.0–0.5)
Eosinophils Relative: 0 %
HCT: 38.6 % (ref 36.0–46.0)
Hemoglobin: 13.4 g/dL (ref 12.0–15.0)
Immature Granulocytes: 0 %
Lymphocytes Relative: 15 %
Lymphs Abs: 1.6 K/uL (ref 0.7–4.0)
MCH: 30 pg (ref 26.0–34.0)
MCHC: 34.7 g/dL (ref 30.0–36.0)
MCV: 86.5 fL (ref 80.0–100.0)
Monocytes Absolute: 0.8 K/uL (ref 0.1–1.0)
Monocytes Relative: 7 %
Neutro Abs: 8 K/uL — ABNORMAL HIGH (ref 1.7–7.7)
Neutrophils Relative %: 77 %
Platelets: 184 K/uL (ref 150–400)
RBC: 4.46 MIL/uL (ref 3.87–5.11)
RDW: 13.1 % (ref 11.5–15.5)
WBC: 10.4 K/uL (ref 4.0–10.5)
nRBC: 0 % (ref 0.0–0.2)

## 2024-07-23 MED ORDER — ONDANSETRON 4 MG PO TBDP
4.0000 mg | ORAL_TABLET | Freq: Once | ORAL | Status: AC
  Administered 2024-07-23: 4 mg via ORAL
  Filled 2024-07-23: qty 1

## 2024-07-23 MED ORDER — AMOXICILLIN-POT CLAVULANATE 875-125 MG PO TABS: 1.0000 | ORAL_TABLET | Freq: Two times a day (BID) | ORAL | 0 refills | Status: AC

## 2024-07-23 MED ORDER — SODIUM CHLORIDE 0.9 % IV SOLN
3.0000 g | Freq: Four times a day (QID) | INTRAVENOUS | Status: DC
  Administered 2024-07-23: 3 g via INTRAVENOUS
  Filled 2024-07-23: qty 8

## 2024-07-23 MED ORDER — OXYCODONE HCL 5 MG PO TABS
5.0000 mg | ORAL_TABLET | Freq: Once | ORAL | Status: AC
  Administered 2024-07-23: 5 mg via ORAL
  Filled 2024-07-23: qty 1

## 2024-07-23 MED ORDER — IOHEXOL 300 MG/ML  SOLN
75.0000 mL | Freq: Once | INTRAMUSCULAR | Status: AC | PRN
  Administered 2024-07-23: 75 mL via INTRAVENOUS

## 2024-07-23 MED ORDER — HYDROMORPHONE HCL 1 MG/ML IJ SOLN
0.5000 mg | Freq: Once | INTRAMUSCULAR | Status: AC
  Administered 2024-07-23: 0.5 mg via INTRAVENOUS
  Filled 2024-07-23: qty 0.5

## 2024-07-23 NOTE — Discharge Instructions (Addendum)
 It was a pleasure taking care for you today.  Based on your history, physical exam, labs, and imaging I feel you are safe for discharge.  Today you have been given a prescription for an antibiotic, please take as prescribed and complete the entire course.  Please continue to monitor the swelling and redness of your face, if it starts to involve your eye, you have visual changes, severe eye pain, trouble breathing, trouble swallowing, refractory fever, chills, stiff neck, or other concerning symptom please return to the emergency department or seek further medical evaluation.  Please schedule a follow-up appointment with your primary care provider within 48 to 72 hours for a check of how your face and mouth look.  Please make an appointment with a dentist as soon as possible as right now this is the most likely cause of where the cellulitis and infection is coming from. You may continue to take over the counter tylenol  and ibuprofen for your fever and to assist with pain with your other prescribed medications. Please keep in mind that the max daily dose of tylenol  is 4000mg /day and the max dose of ibuprofen is 1200mg /day.

## 2024-07-23 NOTE — ED Provider Notes (Signed)
 [place] EMERGENCY DEPARTMENT AT [facility] Provider Note   CSN: [id number] Arrival date & time: 07/23/24  1225     Patient presents with: Facial Swelling   [name] is a 53 y.o. female who presents to the emergency department with a chief complaint of facial swelling and mouth pain.  Patient states that she had some bad teeth on the upper left side of her jaw and has been told by dentist that she needs multiple root canals, however she is unable to afford this at this time.  Patient states that her mouth became painful yesterday and she noticed some mild swelling to the left side of her face.  She states then that overnight the swelling to her face increased more and she now has redness extending up to right underneath her left eye.  She also appreciates a fever of up to 100 F at home which has gone down due to her taking Tylenol  and ibuprofen at home.  Patient states that she had a leftover prescription of doxycycline at home and has been taking this starting yesterday. Denies vision changes.  Denies issues swallowing or issues breathing. Denies diaphoresis, chest pain, shortness of breath, abdominal pain, nausea, vomiting. She does however appreciate pain when opening her mouth or chewing.  Patient has a past medical history significant for hypertension, diabetes, hyperlipidemia, anxiety, thyroidectomy, cardiac stent, etc.    HPI     Prior to Admission medications   Medication Sig Start Date End Date Taking? Authorizing Provider  amoxicillin -clavulanate (AUGMENTIN ) 875-125 MG tablet Take 1 tablet by mouth every 12 (twelve) hours for 7 days. 07/23/24 07/30/24 Yes Idrees Quam F, PA-C  ALPRAZolam (XANAX) 0.5 MG tablet Take 0.5 mg by mouth 2 (two) times daily as needed for anxiety (vertigo). 09/17/21   [provider]  aspirin  EC 81 MG tablet Take 1 tablet (81 mg total) by mouth daily. Swallow whole. 03/14/24   Mallipeddi, Vishnu P, MD  atorvastatin  (LIPITOR) 40 MG  tablet Take 1 tablet (40 mg total) by mouth daily. 03/04/24   Mallipeddi, Vishnu P, MD  B-D UF III MINI PEN NEEDLES 31G X 5 MM MISC USE AND DISCARD 1 PEN      NEEDLE IN THE MORNING, AT   NOON, IN THE EVENING, AND  AT BEDTIME 02/21/23   Reardon, Benton PARAS, NP  B-D UF III MINI PEN NEEDLES 31G X 5 MM MISC USE AND DISCARD 1 PEN      NEEDLE 3 TIMES A DAY FOR   SUBCUTANEOUS INJECTION 08/02/23   Therisa Benton PARAS, NP  B-D UF III MINI PEN NEEDLES 31G X 5 MM MISC Use to inject insulin 4 times daily 09/26/23   Therisa Benton PARAS, NP  BIOTIN PO Take by mouth.    [provider]  blood glucose meter kit and supplies KIT Dispense based on patient and insurance preference. Use up to four times daily as directed. 09/28/21   Therisa Benton PARAS, NP  butalbital -acetaminophen -caffeine  (FIORICET) 50-325-40 MG tablet Take 1 tablet by mouth 2 (two) times daily as needed for headache. 09/07/21   [provider]  CALCIUM  PO Take by mouth.    [provider]  clopidogrel  (PLAVIX ) 75 MG tablet Take 1 tablet (75 mg total) by mouth daily. 06/26/24 03/23/25  Mallipeddi, Vishnu P, MD  Continuous Glucose Sensor (DEXCOM G7 SENSOR) MISC Inject 1 Application into the skin as directed. Change sensor every 10 days as directed. 06/21/24   Therisa Benton PARAS, NP  diazepam (VALIUM) 2 MG tablet Take 2 mg by mouth 2 (two) times daily as needed for anxiety. 09/17/21   [provider]  diphenhydrAMINE (BENADRYL) 25 MG tablet Take 25 mg by mouth daily as needed for allergies.    [provider]  DULoxetine (CYMBALTA) 30 MG capsule Take 30 mg by mouth 2 (two) times daily. 09/24/21   [provider]  fenofibrate micronized (LOFIBRA) 200 MG capsule Take 200 mg by mouth daily before breakfast.    [provider]  Flaxseed, Linseed, (FLAXSEED OIL PO) Take by mouth.    [provider]  gabapentin (NEURONTIN) 800 MG tablet Take 800 mg by mouth 4 (four) times daily. 09/17/21   [provider]  glimepiride  (AMARYL ) 4 MG tablet Take 1 tablet (4 mg total) by mouth daily. 07/02/24   Therisa Benton PARAS, NP  HYDROcodone-acetaminophen  (NORCO) 10-325 MG tablet Take 1 tablet by mouth 3 (three) times daily as needed for severe pain (pain score 7-10).    [provider]  hydrOXYzine (ATARAX/VISTARIL) 25 MG tablet Take 25 mg by mouth 4 (four) times daily as needed for itching. 10/13/18   [provider]  insulin aspart  (NOVOLOG  FLEXPEN) 100 UNIT/ML FlexPen Inject 8-14 Units into the skin 3 (three) times daily with meals. Patient taking differently: Inject 10-16 Units into the skin 3 (three) times daily with meals. 03/26/24   Therisa Benton PARAS, NP  Insulin Disposable Pump (OMNIPOD 5 DEXG7G6 INTRO GEN 5) KIT Change pod every 48-72 hours Patient not taking: Reported on 05/15/2024 05/06/24   Therisa Benton PARAS, NP  Insulin Disposable Pump (OMNIPOD 5 DEXG7G6 PODS GEN 5) MISC Change pod every 48-72 hours 07/02/24   Therisa Benton PARAS, NP  insulin glargine , 2 Unit Dial, (TOUJEO  MAX SOLOSTAR) 300 UNIT/ML Solostar Pen Inject 55 Units into the skin at bedtime. 02/28/24   Therisa Benton PARAS, NP  Lancets (ONETOUCH DELICA PLUS [place]) MISC Apply 1 each topically 4 (four) times daily. 09/28/21   [provider]  levothyroxine  (SYNTHROID ) 175 MCG tablet Take 1 tablet (175 mcg total) by mouth daily before breakfast. 06/25/24   Therisa Benton PARAS, NP  lisinopril (ZESTRIL) 2.5 MG tablet Take 2.5 mg by mouth daily. 09/24/21   [provider]  meclizine (ANTIVERT) 25 MG tablet Take 25 mg by mouth 4 (four) times daily as needed for dizziness. 09/07/21   [provider]  Melatonin 10 MG TABS Take 10 mg by mouth at bedtime as needed (sleep).    [provider]  metoprolol  tartrate (LOPRESSOR ) 25 MG tablet Take 1 tablet (25 mg total) by mouth 2 (two) times daily. 06/26/24   Mallipeddi, Vishnu P, MD  nitrofurantoin , macrocrystal-monohydrate, (MACROBID ) 100 MG  capsule Take 1 capsule (100 mg total) by mouth 2 (two) times daily. 05/17/24   Signa Delon LABOR, NP  nitroGLYCERIN  (NITROSTAT ) 0.4 MG SL tablet Place 1 tablet (0.4 mg total) under the tongue every 5 (five) minutes as needed. 03/19/24   Mallipeddi, Vishnu P, MD  Omega-3 Fatty Acids (FISH OIL PO) Take by mouth.    [provider]  [facility] VERIO test strip 1 each 4 (four) times daily. 09/28/21   [provider]  Semaglutide , 1 MG/DOSE, 4 MG/3ML SOPN Inject 1 mg into the skin once a week. Starting week 9, this will be your maintenance dose 08/13/24   Mallipeddi, Vishnu P, MD  Semaglutide ,0.25 or 0.5MG /DOS, 2 MG/3ML SOPN Inject 0.5 mg into the skin once a week. For weeks 5-8 07/13/24 08/12/24  Mallipeddi, Vishnu P, MD  traMADol (ULTRAM) 50 MG tablet Take 100 mg by mouth 3 (three) times daily as needed for moderate pain. 09/07/21   [provider]  VITAMIN D  PO Take 1 capsule by mouth daily.    [provider]    Allergies: Aspirin , Sulfa antibiotics, and Sulfamethoxazole-trimethoprim    Review of Systems  HENT:  Positive for dental problem.     Updated Vital Signs BP (!) 155/73   Pulse 79   Temp 98.7 F (37.1 C) (Oral)   Resp 14   Ht 5' 4 (1.626 m)   Wt 99.8 kg   SpO2 91%   BMI 37.76 kg/m   Physical Exam Vitals and nursing note reviewed.  Constitutional:      General: She is awake. She is not in acute distress.    Appearance: Normal appearance. She is not ill-appearing, toxic-appearing or diaphoretic.  HENT:     Head: Normocephalic and atraumatic.     Comments: Significant swelling present to left side of face when compared to right     Mouth/Throat:     Mouth: Mucous membranes are moist.     Comments: Abscess present to upper left jaw surrounded by 3 teeth with previous dental work, not actively draining, no tongue swelling noted, no uvular deviation, no sign of PTA Eyes:     General: No scleral icterus.       Right eye: No discharge.        Left  eye: No discharge.     Extraocular Movements: Extraocular movements intact.     Conjunctiva/sclera: Conjunctivae normal.     Pupils: Pupils are equal, round, and reactive to light.  Pulmonary:     Effort: Pulmonary effort is normal. No respiratory distress.  Musculoskeletal:        General: Normal range of motion.     Cervical back: Normal range of motion.     Right lower leg: No edema.     Left lower leg: No edema.  Skin:    General: Skin is warm.     Capillary Refill: Capillary refill takes less than 2 seconds.     Comments: Left side of face erythematous and slightly warm to touch, no areas of fluctuance noted  Neurological:     General: No focal deficit present.     Mental Status: She is alert and oriented to person, place, and time.  Psychiatric:        Mood and Affect: Mood normal.        Behavior: Behavior normal. Behavior is cooperative.         (all labs ordered are listed, but only abnormal results are displayed) Labs Reviewed  CBC WITH DIFFERENTIAL/PLATELET - Abnormal; Notable for the following components:      Result Value   Neutro Abs 8.0 (*)    All other components within normal limits  COMPREHENSIVE METABOLIC PANEL WITH GFR - Abnormal; Notable for the following components:   Chloride 96 (*)    Glucose, Bld 242 (*)    Creatinine, Ser 1.07 (*)    Anion gap 16 (*)    All other components within normal limits    EKG: None  Radiology: CT Maxillofacial W Contrast Result Date: 07/23/2024 EXAM: CT Face with contrast 07/23/2024 05:33:03 PM TECHNIQUE: CT of the face was performed with the administration of intravenous contrast. Multiplanar reformatted images are provided for review. Automated exposure control, iterative reconstruction, and/or weight based adjustment of the mA/kV was utilized to reduce  the radiation dose to as low as reasonably achievable. COMPARISON: None available CLINICAL HISTORY: Facial cellulitis, dental abscess. Pt to the ED with left side  facial swelling and reports of a sore on one of her left teeth. Pt reports the facial swelling began yesterday. FINDINGS: AERODIGESTIVE TRACT: No mass. No edema. SALIVARY GLANDS: No acute abnormality. LYMPH NODES: No suspicious cervical lymphadenopathy. SOFT TISSUES: There is soft tissue swelling in the buccal space overlying the left aspect of the maxilla. There is mild stranding within the adjacent subcutaneous tissue of the left face overlying the maxillary sinus extending superiorly to the inferior soft tissues of the left orbit. Stranding in the subcutaneous tissues of the left face extend inferiorly into the left anterior neck. There is mild asymmetric thickening of the left platysma. No soft tissue swelling of deeper spaces in the visualized upper neck. No evidence of peripherally enhancing fluid collection to suggest abscess. BRAIN, ORBITS AND SINUSES: Mucosal thickening in the left maxillary sinus with large air-fluid level. Additional mild-to-moderate mucosal thickening in the left ethmoid sinus. The globes are intact. Lenses are normally located. Normal appearance of the extraocular muscles and optic nerve sheath complexes. Normal appearance of the intraorbital fat. BONES: No acute maxillofacial fracture or destructive osseous lesion. There is a periapical lucency involving the left maxillary second premolar and first molar with thinning of the left maxillary sinus floor at this level. Rightward deviation of the osseous nasal septum. IMPRESSION: 1. Left maxillary sinus mucosal thickening with large air-fluid level, concerning for acute sinusitis. 2. Periapical lucency involving the left maxillary second premolar and first molar with thinning of the left maxillary sinus floor at this level. Left maxillary sinus disease may partially odontogenic in etiology. 3. Soft tissue swelling in the buccal space overlying the left aspect of the maxilla, with mild stranding within the adjacent subcutaneous tissue of  the left face concerning for cellulitis. No evidence of abscess. Electronically signed by: Donnice Mania MD 07/23/2024 06:08 PM EDT RP Workstation: [id number]     Procedures   Medications Ordered in the ED  Ampicillin -Sulbactam (UNASYN ) 3 g in sodium chloride  0.9 % 100 mL IVPB (0 g Intravenous Stopped 07/23/24 1457)  oxyCODONE  (Oxy IR/ROXICODONE ) immediate release tablet 5 mg (5 mg Oral Given 07/23/24 1431)  HYDROmorphone  (DILAUDID ) injection 0.5 mg (0.5 mg Intravenous Given 07/23/24 1556)  ondansetron  (ZOFRAN -ODT) disintegrating tablet 4 mg (4 mg Oral Given 07/23/24 1554)  iohexol  (OMNIPAQUE ) 300 MG/ML solution 75 mL (75 mLs Intravenous Contrast Given 07/23/24 1724)  HYDROmorphone  (DILAUDID ) injection 0.5 mg (0.5 mg Intravenous Given 07/23/24 1814)    Clinical Course as of 07/24/24 0004  Tue Jul 23, 2024  1457 Facial cellulitis, upper left gum abscess, labs, imaging, IV abx [CH]    Clinical Course User Index [CH] Mattthew Ziomek, Terrall FALCON, PA-C                                 Medical Decision Making Amount and/or Complexity of Data Reviewed Labs: ordered. Radiology: ordered.  Risk Prescription drug management.   Patient presents to the ED for concern of dental pain, facial swelling, this involves an extensive number of treatment options, and is a complaint that carries with it a high risk of complications and morbidity.  The differential diagnosis includes dental infection, dental abscess, Ludwig's angina, peritonsillar abscess, angioedema, facial cellulitis, periorbital cellulitis, postseptal cellulitis, herpes zoster ophthalmicus, etc.   Co morbidities that complicate the patient evaluation  hypertension, diabetes, hyperlipidemia, anxiety, thyroidectomy, cardiac stent   Lab Tests:  I Ordered, and personally interpreted labs.  The pertinent results include: CBC unremarkable, CMP unremarkable other than elevated glucose at 242, elevated creatinine at 1.07, anion gap of 16 Low clinical  suspicion for DKA at this time as patient denies nausea, vomiting, abdominal pain, anion gap is only mildly elevated at 16, patient neurologically intact   Imaging Studies ordered:  I ordered imaging studies including CT maxillofacial with contrast I independently visualized and interpreted imaging which showed facial cellulitis with no evidence of abscess, possible oral infection, possible sinusitis I agree with the radiologist interpretation   Medicines ordered and prescription drug management:  I ordered medication including Unasyn  for dental infection, facial cellulitis, Dilaudid  for pain, Zofran  for nausea Reevaluation of the patient after these medicines showed that the patient improved I have reviewed the patients home medicines and have made adjustments as needed   Test Considered:  None   Critical Interventions:  None   Problem List / ED Course:  53 year old female, facial swelling and dental abscess, known dental problems but has been able unable to afford root canal, vital signs stable in the emergency department but subjective fever at home with Tmax of 100 F improving with Tylenol  and ibuprofen Physical exam significant for notable dental abscess of the left upper gum, no uvular deviation or sign of peritonsillar abscess, no extensive neck swelling consistent with Ludwig's angina, no visual changes or issues with extraocular motion, vision grossly intact by my exam, low clinical suspicion for deep cellulitis/infection involving eye Patient has taken doxycycline for 1 day, states that facial swelling and redness has worsened overnight Plan to obtain general lab work and start IV antibiotics Lab work overall reassuring, no elevated white blood cell count, CMP does show hyperglycemia with slightly elevated anion gap at 16 however low clinical suspicion for DKA based off of patient's symptoms and presentation CT maxillofacial with contrast showed facial cellulitis,  possible sinusitis with dental etiology Based off of CT showing no deep abscess or deep underlying infection, will plan to discharge patient with outpatient antibiotics, patient given 1 dose of IV Unasyn  here in the emergency department, plan to discharge with prescription for Augmentin  Return precautions given surrounding infectious symptoms, patient understands that if cellulitis worsens, extends to the eye and she has any eye symptoms, refractory fever, chills, nausea, vomiting, trouble breathing, trouble swallowing, or other concerning symptoms she needs to return the emergency department.  I also instructed her to follow-up with her primary care provider by the end of the week for a recheck to ensure that the facial cellulitis has not gotten any worse, patient understanding of this Most likely diagnosis at this time is facial cellulitis possibly originating from dental infection, patient treated symptomatically here in the emergency department and given outpatient course of antibiotics.  Patient understands to take over-the-counter Tylenol  and ibuprofen as well as her prescribed controlled pain medications for help with pain and fever.    Reevaluation:  After the interventions noted above, I reevaluated the patient and found that they have :improved   Social Determinants of Health:  Cannot afford dental procedure   Dispostion:  After consideration of the diagnostic results and the patients response to treatment, I feel that the patent would benefit from discharge and outpatient therapy as prescribed.  Follow-up with primary care provider within 48 to 72 hours for recheck of facial cellulitis and dental abscess.  Making an appointment with a dentist as soon  as possible for ongoing diagnosis and treatment.     Final diagnoses:  Dental abscess  Facial cellulitis    ED Discharge Orders          Ordered    amoxicillin -clavulanate (AUGMENTIN ) 875-125 MG tablet  Every 12 hours         07/23/24 1859               Janetta Terrall FALCON, PA-C 07/24/24 0004    Franklyn Sid SAILOR, MD 07/24/24 [phone]

## 2024-07-23 NOTE — ED Triage Notes (Signed)
 Pt to the ED with Left side facial swelling and reports of a sore on one of her left teeth.  Pt reports the facial swelling began yesterday.

## 2024-08-02 ENCOUNTER — Other Ambulatory Visit: Payer: Self-pay | Admitting: Nurse Practitioner

## 2024-08-02 DIAGNOSIS — E1165 Type 2 diabetes mellitus with hyperglycemia: Secondary | ICD-10-CM | POA: Diagnosis not present

## 2024-08-02 DIAGNOSIS — M545 Low back pain, unspecified: Secondary | ICD-10-CM | POA: Diagnosis not present

## 2024-08-02 DIAGNOSIS — Z0001 Encounter for general adult medical examination with abnormal findings: Secondary | ICD-10-CM | POA: Diagnosis not present

## 2024-08-02 DIAGNOSIS — Z1331 Encounter for screening for depression: Secondary | ICD-10-CM | POA: Diagnosis not present

## 2024-08-02 DIAGNOSIS — F411 Generalized anxiety disorder: Secondary | ICD-10-CM | POA: Diagnosis not present

## 2024-08-02 DIAGNOSIS — I10 Essential (primary) hypertension: Secondary | ICD-10-CM | POA: Diagnosis not present

## 2024-08-02 DIAGNOSIS — M159 Polyosteoarthritis, unspecified: Secondary | ICD-10-CM | POA: Diagnosis not present

## 2024-08-02 DIAGNOSIS — K047 Periapical abscess without sinus: Secondary | ICD-10-CM | POA: Diagnosis not present

## 2024-08-02 DIAGNOSIS — E6609 Other obesity due to excess calories: Secondary | ICD-10-CM | POA: Diagnosis not present

## 2024-08-02 DIAGNOSIS — L03211 Cellulitis of face: Secondary | ICD-10-CM | POA: Diagnosis not present

## 2024-08-02 DIAGNOSIS — Z6835 Body mass index (BMI) 35.0-35.9, adult: Secondary | ICD-10-CM | POA: Diagnosis not present

## 2024-08-02 DIAGNOSIS — M2550 Pain in unspecified joint: Secondary | ICD-10-CM | POA: Diagnosis not present

## 2024-08-06 ENCOUNTER — Telehealth: Payer: Self-pay | Admitting: Nurse Practitioner

## 2024-08-06 NOTE — Telephone Encounter (Signed)
 PCP wants pt to get back in to see you. See notes under media. Please advise

## 2024-08-06 NOTE — Telephone Encounter (Signed)
 We can overbook within reason.  Preferably not on a day we already have 18-20.

## 2024-08-07 ENCOUNTER — Encounter: Payer: Self-pay | Admitting: Internal Medicine

## 2024-08-08 ENCOUNTER — Encounter: Payer: Self-pay | Admitting: Internal Medicine

## 2024-08-08 NOTE — Telephone Encounter (Signed)
 Patient is following up regarding this matter. She says she plans to have her husband pick up the medication this afternoon on her behalf--just an FYI.

## 2024-08-26 NOTE — Telephone Encounter (Signed)
 Yes, those labs will work.

## 2024-08-28 ENCOUNTER — Encounter: Payer: Self-pay | Admitting: Internal Medicine

## 2024-08-30 ENCOUNTER — Ambulatory Visit: Payer: Self-pay | Admitting: Nurse Practitioner

## 2024-08-30 DIAGNOSIS — E890 Postprocedural hypothyroidism: Secondary | ICD-10-CM

## 2024-08-30 DIAGNOSIS — E1165 Type 2 diabetes mellitus with hyperglycemia: Secondary | ICD-10-CM

## 2024-09-05 NOTE — Patient Instructions (Signed)

## 2024-09-06 ENCOUNTER — Ambulatory Visit: Admitting: Nurse Practitioner

## 2024-09-06 ENCOUNTER — Encounter: Payer: Self-pay | Admitting: Nurse Practitioner

## 2024-09-06 VITALS — BP 106/78 | HR 73 | Ht 64.5 in | Wt 216.6 lb

## 2024-09-06 DIAGNOSIS — E559 Vitamin D deficiency, unspecified: Secondary | ICD-10-CM | POA: Diagnosis not present

## 2024-09-06 DIAGNOSIS — Z7984 Long term (current) use of oral hypoglycemic drugs: Secondary | ICD-10-CM

## 2024-09-06 DIAGNOSIS — I10 Essential (primary) hypertension: Secondary | ICD-10-CM

## 2024-09-06 DIAGNOSIS — Z794 Long term (current) use of insulin: Secondary | ICD-10-CM

## 2024-09-06 DIAGNOSIS — E890 Postprocedural hypothyroidism: Secondary | ICD-10-CM | POA: Diagnosis not present

## 2024-09-06 DIAGNOSIS — Z7985 Long-term (current) use of injectable non-insulin antidiabetic drugs: Secondary | ICD-10-CM

## 2024-09-06 DIAGNOSIS — E1165 Type 2 diabetes mellitus with hyperglycemia: Secondary | ICD-10-CM

## 2024-09-06 DIAGNOSIS — E782 Mixed hyperlipidemia: Secondary | ICD-10-CM

## 2024-09-06 LAB — POCT GLYCOSYLATED HEMOGLOBIN (HGB A1C): Hemoglobin A1C: 6.9 % — AB (ref 4.0–5.6)

## 2024-09-06 MED ORDER — LEVOTHYROXINE SODIUM 175 MCG PO TABS: 175.0000 ug | ORAL_TABLET | Freq: Every day | ORAL | 1 refills | Status: AC

## 2024-09-06 MED ORDER — TOUJEO MAX SOLOSTAR 300 UNIT/ML ~~LOC~~ SOPN: 50.0000 [IU] | PEN_INJECTOR | Freq: Every day | SUBCUTANEOUS | 3 refills | Status: AC

## 2024-09-06 MED ORDER — NOVOLOG FLEXPEN 100 UNIT/ML ~~LOC~~ SOPN: 8.0000 [IU] | PEN_INJECTOR | Freq: Three times a day (TID) | SUBCUTANEOUS | 3 refills | Status: AC

## 2024-09-06 NOTE — Progress Notes (Signed)
 Endocrinology Follow Up Note       09/06/2024, 11:47 AM   Subjective:    Patient ID: [name], female    DOB: 1970/12/16.  [name] is being seen in follow up after being seen in consultation for management of currently uncontrolled symptomatic diabetes requested by  Bertell Satterfield, MD.   Past Medical History:  Diagnosis Date   Anxiety    BV (bacterial vaginosis)    BV   Cancer (HCC) 2009   papillary thyroid  cancer   Central vestibular vertigo    Depression    Diabetes mellitus without complication (HCC)    Family history of breast cancer    Hyperlipidemia    Hypertension    Hypothyroidism    IBS (irritable bowel syndrome)    Thyroid  disease     Past Surgical History:  Procedure Laterality Date   ABLATION     CARPAL TUNNEL RELEASE     CARPAL TUNNEL RELEASE Right    CESAREAN SECTION     CORONARY STENT INTERVENTION N/A 02/09/2024   Procedure: CORONARY STENT INTERVENTION;  Surgeon: Anner Alm ORN, MD;  Location: [facility];  Service: Cardiovascular;  Laterality: N/A;   CYST EXCISION N/A 09/12/2022   Procedure: CYST REMOVAL, NECK;  Surgeon: Kallie Manuelita BROCKS, MD;  Location: [facility];  Service: General;  Laterality: N/A;   LEFT HEART CATH AND CORONARY ANGIOGRAPHY N/A 02/09/2024   Procedure: LEFT HEART CATH AND CORONARY ANGIOGRAPHY;  Surgeon: Anner Alm ORN, MD;  Location: [facility] INVASIVE CV LAB;  Service: Cardiovascular;  Laterality: N/A;   LIPOMA EXCISION Left 09/12/2022   Procedure: EXCISION LIPOMA, THIGH;  Surgeon: Kallie Manuelita BROCKS, MD;  Location: [facility];  Service: General;  Laterality: Left;   NECK AND CHEST LESION     THYROIDECTOMY     Right and Left, 05/2008, 09/2008   WISDOM TOOTH EXTRACTION      Social History   Socioeconomic History   Marital status: Married    Spouse name: Not on file   Number of children: Not on file   Years of education: Not on file   Highest education  level: Not on file  Occupational History   Not on file  Tobacco Use   Smoking status: Former   Smokeless tobacco: Never  Vaping Use   Vaping status: Never Used  Substance and Sexual Activity   Alcohol use: Yes    Comment: occ   Drug use: Never   Sexual activity: Yes    Birth control/protection: Surgical    Comment: tubal & ablation  Other Topics Concern   Not on file  Social History Narrative   Not on file   Social Drivers of Health   Financial Resource Strain: Medium Risk (10/20/2023)   Overall Financial Resource Strain (CARDIA)    Difficulty of Paying Living Expenses: Somewhat hard  Food Insecurity: No Food Insecurity (10/20/2023)   Hunger Vital Sign    Worried About Running Out of Food in the Last Year: Never true    Ran Out of Food in the Last Year: Never true  Transportation Needs: No Transportation Needs (10/20/2023)   PRAPARE - Transportation    Lack of Transportation (  Medical): No    Lack of Transportation (Non-Medical): No  Physical Activity: Insufficiently Active (10/20/2023)   Exercise Vital Sign    Days of Exercise per Week: 1 day    Minutes of Exercise per Session: 10 min  Stress: Stress Concern Present (10/20/2023)   [employer] of Occupational Health - Occupational Stress Questionnaire    Feeling of Stress : Rather much  Social Connections: Moderately Isolated (10/20/2023)   Social Connection and Isolation Panel    Frequency of Communication with Friends and Family: Once a week    Frequency of Social Gatherings with Friends and Family: Once a week    Attends Religious Services: 1 to 4 times per year    Active Member of [employer] or Organizations: No    Attends Engineer, structural: Never    Marital Status: Married    Family History  Problem Relation Age of Onset   Breast cancer Maternal Grandmother        died in her late 10s   Hypertension Father    Hyperlipidemia Father    Diabetes Father    Breast cancer Mother        died at age  88   Mental illness Mother    Lung cancer Maternal Uncle     Outpatient Encounter Medications as of 09/06/2024  Medication Sig   ALPRAZolam (XANAX) 0.5 MG tablet Take 0.5 mg by mouth 2 (two) times daily as needed for anxiety (vertigo).   aspirin  EC 81 MG tablet Take 1 tablet (81 mg total) by mouth daily. Swallow whole.   atorvastatin  (LIPITOR) 40 MG tablet Take 1 tablet (40 mg total) by mouth daily.   B-D UF III MINI PEN NEEDLES 31G X 5 MM MISC USE AND DISCARD 1 PEN      NEEDLE IN THE MORNING, AT   NOON, IN THE EVENING, AND  AT BEDTIME   B-D UF III MINI PEN NEEDLES 31G X 5 MM MISC USE AND DISCARD 1 PEN      NEEDLE 3 TIMES A DAY FOR   SUBCUTANEOUS INJECTION   B-D UF III MINI PEN NEEDLES 31G X 5 MM MISC Use to inject insulin 4 times daily   BIOTIN PO Take by mouth.   blood glucose meter kit and supplies KIT Dispense based on patient and insurance preference. Use up to four times daily as directed.   butalbital -acetaminophen -caffeine  (FIORICET) 50-325-40 MG tablet Take 1 tablet by mouth 2 (two) times daily as needed for headache.   CALCIUM  PO Take by mouth.   clopidogrel  (PLAVIX ) 75 MG tablet Take 1 tablet (75 mg total) by mouth daily.   Continuous Glucose Sensor (DEXCOM G7 SENSOR) MISC Inject 1 Application into the skin as directed. Change sensor every 10 days as directed.   diazepam (VALIUM) 2 MG tablet Take 2 mg by mouth 2 (two) times daily as needed for anxiety.   diphenhydrAMINE (BENADRYL) 25 MG tablet Take 25 mg by mouth daily as needed for allergies.   DULoxetine (CYMBALTA) 30 MG capsule Take 30 mg by mouth 2 (two) times daily.   fenofibrate micronized (LOFIBRA) 200 MG capsule Take 200 mg by mouth daily before breakfast.   Flaxseed, Linseed, (FLAXSEED OIL PO) Take by mouth.   gabapentin (NEURONTIN) 800 MG tablet Take 800 mg by mouth 4 (four) times daily.   HYDROcodone-acetaminophen  (NORCO) 10-325 MG tablet Take 1 tablet by mouth 3 (three) times daily as needed for severe pain (pain score  7-10).   hydrOXYzine (ATARAX/VISTARIL) 25 MG  tablet Take 25 mg by mouth 4 (four) times daily as needed for itching.   isosorbide  mononitrate (IMDUR ) 30 MG 24 hr tablet Take 30 mg by mouth daily.   Lancets (ONETOUCH DELICA PLUS LANCET33G) MISC Apply 1 each topically 4 (four) times daily.   lisinopril (ZESTRIL) 2.5 MG tablet Take 2.5 mg by mouth daily.   meclizine (ANTIVERT) 25 MG tablet Take 25 mg by mouth 4 (four) times daily as needed for dizziness.   Melatonin 10 MG TABS Take 10 mg by mouth at bedtime as needed (sleep).   metoprolol  tartrate (LOPRESSOR ) 25 MG tablet Take 1 tablet (25 mg total) by mouth 2 (two) times daily.   nitrofurantoin , macrocrystal-monohydrate, (MACROBID ) 100 MG capsule Take 1 capsule (100 mg total) by mouth 2 (two) times daily.   nitroGLYCERIN  (NITROSTAT ) 0.4 MG SL tablet Place 1 tablet (0.4 mg total) under the tongue every 5 (five) minutes as needed.   Omega-3 Fatty Acids (FISH OIL PO) Take by mouth.   ONETOUCH VERIO test strip 1 each 4 (four) times daily.   Semaglutide , 1 MG/DOSE, 4 MG/3ML SOPN Inject 1 mg into the skin once a week. Starting week 9, this will be your maintenance dose (Patient taking differently: Inject 0.5 mg into the skin once a week. Starting week 9, this will be your maintenance dose)   traMADol (ULTRAM) 50 MG tablet Take 100 mg by mouth 3 (three) times daily as needed for moderate pain.   VITAMIN D  PO Take 1 capsule by mouth daily.   [DISCONTINUED] glimepiride  (AMARYL ) 4 MG tablet TAKE 1 TABLET BY MOUTH EVERY DAY   [DISCONTINUED] insulin aspart  (NOVOLOG  FLEXPEN) 100 UNIT/ML FlexPen Inject 8-14 Units into the skin 3 (three) times daily with meals.   [DISCONTINUED] insulin glargine , 2 Unit Dial, (TOUJEO  MAX SOLOSTAR) 300 UNIT/ML Solostar Pen Inject 55 Units into the skin at bedtime. (Patient taking differently: Inject 50 Units into the skin at bedtime.)   [DISCONTINUED] levothyroxine  (SYNTHROID ) 175 MCG tablet Take 1 tablet (175 mcg total) by mouth  daily before breakfast.   insulin aspart  (NOVOLOG  FLEXPEN) 100 UNIT/ML FlexPen Inject 8-14 Units into the skin 3 (three) times daily with meals.   insulin glargine , 2 Unit Dial, (TOUJEO  MAX SOLOSTAR) 300 UNIT/ML Solostar Pen Inject 50 Units into the skin at bedtime.   levothyroxine  (SYNTHROID ) 175 MCG tablet Take 1 tablet (175 mcg total) by mouth daily before breakfast.   [DISCONTINUED] Insulin Disposable Pump (OMNIPOD 5 DEXG7G6 INTRO GEN 5) KIT Change pod every 48-72 hours (Patient not taking: Reported on 09/06/2024)   [DISCONTINUED] Insulin Disposable Pump (OMNIPOD 5 DEXG7G6 PODS GEN 5) MISC Change pod every 48-72 hours (Patient not taking: Reported on 09/06/2024)   No facility-administered encounter medications on file as of 09/06/2024.    ALLERGIES: Allergies  Allergen Reactions   Aspirin      Upset stomach    Sulfa Antibiotics Hives   Sulfamethoxazole-Trimethoprim Hives and Rash    Bactrim     VACCINATION STATUS:  There is no immunization history on file for this patient.  Diabetes She presents for her follow-up diabetic visit. She has type 2 diabetes mellitus. Onset time: had never been formally diagnosed- has gest DM with both children- started on meds about 1 year ago at age of 53. Her disease course has been improving. Hypoglycemia symptoms include nervousness/anxiousness, sweats and tremors. Associated symptoms include weight loss. Pertinent negatives for diabetes include no blurred vision, no fatigue, no foot paresthesias, no polydipsia and no polyuria. Hypoglycemia complications include nocturnal  hypoglycemia. Symptoms are improving. Diabetic complications include nephropathy and peripheral neuropathy. Risk factors for coronary artery disease include diabetes mellitus, dyslipidemia, family history, obesity, hypertension and sedentary lifestyle. Current diabetic treatment includes intensive insulin program and oral agent (monotherapy) (and Ozempic ). She is compliant with treatment  most of the time. Her weight is decreasing steadily. She is following a generally healthy diet. When asked about meal planning, she reported none. She has not had a previous visit with a dietitian. She rarely participates in exercise. Her home blood glucose trend is decreasing steadily. Her overall blood glucose range is 140-180 mg/dl. (She presents today, accompanied by her daughter, with her CGM showing mostly at goal glycemic profile.  Her POCT A1c today is 6.9%, improving from last visit of 8.6%.  Analysis of her CGM shows TIR 71%, TAR 28%, TBR 1% with a GMI of 7.2%.  She was started on Ozempic  by her cardiologist, who also reviewed thyroid  history and decided the benefits outweighed the risks.  She does note some gut troubles, especially if she eats something higher in fat.  Omnipod was too expensive for her current plan.) An ACE inhibitor/angiotensin II receptor blocker is being taken. She does not see a podiatrist.Eye exam is current.     Review of systems  Constitutional: + steadily decreasing body weight,  current Body mass index is 36.61 kg/m. , + fatigue, no subjective hyperthermia, no subjective hypothermia Eyes: no blurry vision, no xerophthalmia ENT: no sore throat, no dysphagia/odynophagia, no hoarseness Cardiovascular: no chest pain, no shortness of breath, no palpitations, no leg swelling Respiratory: no cough, no shortness of breath Gastrointestinal: + intermittent nausea and bloating (since starting the Ozempic ) Musculoskeletal: no muscle/joint aches Skin: no rashes, no hyperemia Neurological: no tremors, no numbness, no tingling Psychiatric: no depression, no anxiety  Objective:     BP 106/78 (BP Location: Left Arm, Patient Position: Sitting, Cuff Size: Large)   Pulse 73   Ht 5' 4.5 (1.638 m)   Wt 216 lb 9.6 oz (98.2 kg)   BMI 36.61 kg/m   Wt Readings from Last 3 Encounters:  09/06/24 216 lb 9.6 oz (98.2 kg)  07/23/24 220 lb (99.8 kg)  05/15/24 220 lb (99.8 kg)      BP Readings from Last 3 Encounters:  09/06/24 106/78  07/23/24 (!) 155/73  05/15/24 133/65      Physical Exam- Limited  Constitutional:  Body mass index is 36.61 kg/m. , not in acute distress, normal state of mind Eyes:  EOMI, no exophthalmos Musculoskeletal: no gross deformities, strength intact in all four extremities, no gross restriction of joint movements Skin:  no rashes, no hyperemia Neurological: no tremor with outstretched hands   Diabetic Foot Exam - Simple   No data filed      CMP ( most recent) CMP     Component Value Date/Time   NA 137 07/23/2024 1401   NA 139 08/14/2023 0841   K 4.4 07/23/2024 1401   CL 96 (L) 07/23/2024 1401   CO2 25 07/23/2024 1401   GLUCOSE 242 (H) 07/23/2024 1401   BUN 16 07/23/2024 1401   BUN 22 08/14/2023 0841   CREATININE 1.07 (H) 07/23/2024 1401   CALCIUM  9.1 07/23/2024 1401   PROT 8.1 07/23/2024 1401   PROT 6.8 08/14/2023 0841   ALBUMIN 4.3 07/23/2024 1401   ALBUMIN 4.5 08/14/2023 0841   ALBUMIN 30 mg/L 03/23/2023 1553   AST 21 07/23/2024 1401   ALT 40 07/23/2024 1401   ALKPHOS 40 07/23/2024 1401  BILITOT 0.9 07/23/2024 1401   BILITOT <0.2 08/14/2023 0841   GFRNONAA >60 07/23/2024 1401   GFRAA >60 05/18/2011 0856     Diabetic Labs (most recent): Lab Results  Component Value Date   HGBA1C 6.9 (A) 09/06/2024   HGBA1C 6.8 09/26/2023   HGBA1C 8.6 (A) 03/23/2023   MICROALBUR 10 12/24/2021     Lipid Panel ( most recent) Lipid Panel     Component Value Date/Time   CHOL 151 02/06/2024 0834   CHOL 161 08/14/2023 0841   TRIG 148 02/06/2024 0834   HDL 45 02/06/2024 0834   HDL 34 (L) 08/14/2023 0841   CHOLHDL 3.4 02/06/2024 0834   VLDL 30 02/06/2024 0834   LDLCALC 76 02/06/2024 0834   LDLCALC 73 08/14/2023 0841   LABVLDL 54 (H) 08/14/2023 0841      Lab Results  Component Value Date   TSH 5.630 (H) 08/14/2023   TSH 18.400 (H) 01/19/2023   TSH 3.630 01/07/2022   TSH 2.639 10/18/2013   FREET4 1.46  08/14/2023   FREET4 1.22 01/19/2023   FREET4 1.20 01/07/2022       Thyroid  US  from 05/20/22 CLINICAL DATA:  History of papillary thyroid  cancer, status post total thyroidectomy   EXAM: THYROID  ULTRASOUND   TECHNIQUE: Ultrasound examination of the thyroid  gland and adjacent soft tissues was performed.   COMPARISON:  01/09/2008   FINDINGS: Patient is status post total thyroidectomy. No thyroid  bed abnormality, nodule, or residual thyroid  tissue appreciated.   However, in the right superior neck in the submandibular region, there is a superficial solid hypoechoic nodule measuring 1.7 x 1.5 x 1.3 cm, concerning for a pathologic enlarged abnormal lymph node. Consider further evaluation with neck CT with contrast. This would be amenable to ultrasound core biopsy. Additional left neck benign lymph nodes are noted for comparison.   No additional neck mass bulky adenopathy by ultrasound.   IMPRESSION: Remote total thyroidectomy.   Right submandibular region 1.7 cm indeterminate soft tissue mass concerning for a pathologic enlarged lymph node. See above comment and recommendation.   These results will be called to the ordering clinician or representative by the Radiologist Assistant, and communication documented in the PACS or [employer].   The above is in keeping with the ACR TI-RADS recommendations - J Am Coll Radiol 2017;14:587-595.     Electronically Signed   By: CHRISTELLA.  Shick M.D.   On: 05/20/2022 11:42   Latest Reference Range & Units 10/18/13 11:13 01/07/22 10:17 01/19/23 14:49 08/14/23 08:41  TSH 0.450 - 4.500 uIU/mL 2.639 3.630 18.400 (H) 5.630 (H)  T4,Free(Direct) 0.82 - 1.77 ng/dL  8.79 8.77 8.53  (H): Data is abnormally high  Assessment & Plan:   1) Uncontrolled Type 2 Diabetes with hyperglycemia  She presents today, accompanied by her daughter, with her CGM showing mostly at goal glycemic profile.  Her POCT A1c today is 6.9%, improving from last visit of  8.6%.  Analysis of her CGM shows TIR 71%, TAR 28%, TBR 1% with a GMI of 7.2%.  She was started on Ozempic  by her cardiologist, who also reviewed thyroid  history and decided the benefits outweighed the risks.  She does note some gut troubles, especially if she eats something higher in fat.  Omnipod was too expensive for her current plan.  - JASREET DICKIE has currently uncontrolled symptomatic type 2 DM since 53 years of age.   -Recent labs reviewed.  - I had a long discussion with her about the progressive nature of diabetes  and the pathology behind its complications. -her diabetes is complicated by neuropathy and she remains at a high risk for more acute and chronic complications which include CAD, CVA, CKD, retinopathy, and neuropathy. These are all discussed in detail with her.  - Nutritional counseling repeated at each appointment due to patients tendency to fall back in to old habits.  - The patient admits there is a room for improvement in their diet and drink choices. -  Suggestion is made for the patient to avoid simple carbohydrates from their diet including Cakes, Sweet Desserts / Pastries, Ice Cream, Soda (diet and regular), Sweet Tea, Candies, Chips, Cookies, Sweet Pastries, Store Bought Juices, Alcohol in Excess of 1-2 drinks a day, Artificial Sweeteners, Coffee Creamer, and Sugar-free Products. This will help patient to have stable blood glucose profile and potentially avoid unintended weight gain.   - I encouraged the patient to switch to unprocessed or minimally processed complex starch and increased protein intake (animal or plant source), fruits, and vegetables.   - Patient is advised to stick to a routine mealtimes to eat 3 meals a day and avoid unnecessary snacks (to snack only to correct hypoglycemia).  - she will be scheduled with Penny Crumpton, RDN, CDE for diabetes education.  Has appt coming up next week.  - I have approached her with the following individualized plan  to manage her diabetes and patient agrees:   -She is advised to continue Toujeo  50 units SQ nightly (sample pen given from office today) and continue Novolog  to 5-11 units TID with meals if glucose is above 90 and she is eating (Specific instructions on how to titrate insulin dosage based on glucose readings given to patient in writing).  Will stop the Glimepiride  today as she is now on Ozempic  which may increase likelihood of severe hypoglycemia when used together.   She is currently still on Ozempic  0.5 mg SQ weekly (prescribed by cardiology) but is set to increase to the 1 mg dose in the coming weeks.  -she is encouraged to continue monitoring blood glucose 4 times daily (using her CGM), before meals and before bed, and to call the clinic if she has readings less than 70 or above 300 for 3 tests in a row.    - she is warned not to take insulin without proper monitoring per orders. - Adjustment parameters are given to her for hypo and hyperglycemia in writing.  - Specific targets for  A1c; LDL, HDL, and Triglycerides were discussed with the patient.  2) Blood Pressure /Hypertension:  her blood pressure is controlled to target.   she is advised to continue her current medications including Lisinopril 2.5 mg p.o. daily with breakfast.  3) Lipids/Hyperlipidemia:    Recent lipid panel from 08/14/23 shows controlled LDL of 73 and elevated triglycerides of 335.  she is advised to continue Lipitor 10 mg daily at bedtime and Fenofibrate 200 mg po daily.  Side effects and precautions discussed with her.    4)  Weight/Diet:  her Body mass index is 36.61 kg/m.  -  clearly complicating her diabetes care.   she is a candidate for weight loss. I discussed with her the fact that loss of 5 - 10% of her  current body weight will have the most impact on her diabetes management.  Exercise, and detailed carbohydrates information provided  -  detailed on discharge instructions.  5) Hypothyroidism- r/t total  thyroidectomy for thyroid  cancer -There are no recent TFTs to review.  She is  currently on Levothyroxine  175 mcg po daily before breakfast, she is advised to continue this for now.   Will recheck TFTs prior to next visit and adjust dose accordingly.  She is aware that if she loses weight on the Ozempic , it may require a dosage change in her thyroid  medication.   - The correct intake of thyroid  hormone (Levothyroxine , Synthroid ), is on empty stomach first thing in the morning, with water, separated by at least 30 minutes from breakfast and other medications,  and separated by more than 4 hours from calcium , iron, multivitamins, acid reflux medications (PPIs).  - This medication is a life-long medication and will be needed to correct thyroid  hormone imbalances for the rest of your life.  The dose may change from time to time, based on thyroid  blood work.  - It is extremely important to be consistent taking this medication, near the same time each morning.  -AVOID TAKING PRODUCTS CONTAINING BIOTIN (commonly found in Hair, Skin, Nails vitamins) AS IT INTERFERES WITH THE VALIDITY OF THYROID  FUNCTION BLOOD TESTS.  6) Vitamin D  deficiency Her most recent vitamin D  level was 9 on 08/05/21.  She is currently on Ergocalciferol  50000 units weekly.    7) Chronic Care/Health Maintenance: -she is on ACEI/ARB and Statin medications and is encouraged to initiate and continue to follow up with Ophthalmology, Dentist, Podiatrist at least yearly or according to recommendations, and advised to stay away from smoking. I have recommended yearly flu vaccine and pneumonia vaccine at least every 5 years; moderate intensity exercise for up to 150 minutes weekly; and sleep for at least 7 hours a day.  - she is advised to maintain close follow up with Bertell Satterfield, MD for primary care needs, as well as her other providers for optimal and coordinated care.     I spent  42  minutes in the care of the patient today  including review of labs from CMP, Lipids, Thyroid  Function, Hematology (current and previous including abstractions from other facilities); face-to-face time discussing  her blood glucose readings/logs, discussing hypoglycemia and hyperglycemia episodes and symptoms, medications doses, her options of short and long term treatment based on the latest standards of care / guidelines;  discussion about incorporating lifestyle medicine;  and documenting the encounter. Risk reduction counseling performed per USPSTF guidelines to reduce obesity and cardiovascular risk factors.     Please refer to Patient Instructions for Blood Glucose Monitoring and Insulin/Medications Dosing Guide  in media tab for additional information. Please  also refer to  Patient Self Inventory in the Media  tab for reviewed elements of pertinent patient history.  Randall VEAR Clubs participated in the discussions, expressed understanding, and voiced agreement with the above plans.  All questions were answered to her satisfaction. she is encouraged to contact clinic should she have any questions or concerns prior to her return visit.     Follow up plan: - Return in about 4 months (around 01/07/2025) for Diabetes F/U with A1c in office, Previsit labs, Bring meter and logs, Thyroid  follow up.   Benton Rio, [facility] [address] [place], [place] [address] Phone: [phone] Fax: [phone]  09/06/2024, 11:47 AM

## 2024-09-09 ENCOUNTER — Encounter: Payer: Self-pay | Admitting: *Deleted

## 2024-09-09 NOTE — Telephone Encounter (Signed)
 Patient having trouble affording her insulins.  She is already on PAP for Ozempic  (thru cardiology).  I guess we need to print out application for [place] and Novolog .

## 2024-09-09 NOTE — Telephone Encounter (Signed)
 Noted

## 2024-09-12 ENCOUNTER — Telehealth: Payer: Self-pay

## 2024-09-12 NOTE — Telephone Encounter (Signed)
 Form completed and faxed   09/12/24 1:58 pm [facility]

## 2024-09-12 NOTE — Telephone Encounter (Signed)
 Received a fax notice in the [facility] [employer] Patient [employer] que requesting a refill for Ozempic  (see chart media). Please have provider complete RX form and fax to [employer] at [phone].

## 2024-09-16 ENCOUNTER — Institutional Professional Consult (permissible substitution) (INDEPENDENT_AMBULATORY_CARE_PROVIDER_SITE_OTHER): Admitting: Otolaryngology

## 2024-09-22 ENCOUNTER — Other Ambulatory Visit: Payer: Self-pay | Admitting: Internal Medicine

## 2024-09-26 ENCOUNTER — Encounter: Payer: Self-pay | Admitting: Internal Medicine

## 2024-09-26 ENCOUNTER — Other Ambulatory Visit: Payer: Self-pay | Admitting: *Deleted

## 2024-09-26 NOTE — Telephone Encounter (Signed)
 Did we get her patient assistance yet?

## 2024-09-26 NOTE — Telephone Encounter (Signed)
 Have you heard anything from patient assistance about her insulin?

## 2024-10-01 NOTE — Telephone Encounter (Signed)
 Anything yet for her insulin?

## 2024-10-02 NOTE — Telephone Encounter (Signed)
 No. Paper work was sent in last week.

## 2024-10-02 NOTE — Telephone Encounter (Signed)
 Patient may be stopping by/ or sending her family to pick up 2 sample boxes of Toujeo , just FYI.

## 2024-10-16 ENCOUNTER — Ambulatory Visit (INDEPENDENT_AMBULATORY_CARE_PROVIDER_SITE_OTHER): Payer: Self-pay | Admitting: Family Medicine

## 2024-10-16 VITALS — BP 98/59 | HR 65 | Temp 97.7°F | Ht 64.5 in | Wt 204.8 lb

## 2024-10-16 DIAGNOSIS — E1142 Type 2 diabetes mellitus with diabetic polyneuropathy: Secondary | ICD-10-CM | POA: Diagnosis not present

## 2024-10-16 DIAGNOSIS — Z79899 Other long term (current) drug therapy: Secondary | ICD-10-CM

## 2024-10-16 DIAGNOSIS — E119 Type 2 diabetes mellitus without complications: Secondary | ICD-10-CM

## 2024-10-16 DIAGNOSIS — F411 Generalized anxiety disorder: Secondary | ICD-10-CM

## 2024-10-16 DIAGNOSIS — Z23 Encounter for immunization: Secondary | ICD-10-CM

## 2024-10-16 DIAGNOSIS — Z85850 Personal history of malignant neoplasm of thyroid: Secondary | ICD-10-CM

## 2024-10-16 DIAGNOSIS — F410 Panic disorder [episodic paroxysmal anxiety] without agoraphobia: Secondary | ICD-10-CM | POA: Diagnosis not present

## 2024-10-16 DIAGNOSIS — I10 Essential (primary) hypertension: Secondary | ICD-10-CM | POA: Diagnosis not present

## 2024-10-16 DIAGNOSIS — F32A Depression, unspecified: Secondary | ICD-10-CM

## 2024-10-16 DIAGNOSIS — I2510 Atherosclerotic heart disease of native coronary artery without angina pectoris: Secondary | ICD-10-CM

## 2024-10-16 DIAGNOSIS — E1165 Type 2 diabetes mellitus with hyperglycemia: Secondary | ICD-10-CM | POA: Insufficient documentation

## 2024-10-16 DIAGNOSIS — G43801 Other migraine, not intractable, with status migrainosus: Secondary | ICD-10-CM | POA: Diagnosis not present

## 2024-10-16 DIAGNOSIS — Z9889 Other specified postprocedural states: Secondary | ICD-10-CM | POA: Diagnosis not present

## 2024-10-16 DIAGNOSIS — G8929 Other chronic pain: Secondary | ICD-10-CM | POA: Diagnosis not present

## 2024-10-16 DIAGNOSIS — C73 Malignant neoplasm of thyroid gland: Secondary | ICD-10-CM | POA: Insufficient documentation

## 2024-10-16 MED ORDER — ALPRAZOLAM 0.5 MG PO TABS: 0.5000 mg | ORAL_TABLET | Freq: Three times a day (TID) | ORAL | 0 refills | Status: DC | PRN

## 2024-10-16 NOTE — Progress Notes (Addendum)
 New Patient Office Visit  Patient ID: [name], Female   DOB: Oct 04, 1971 53 y.o. MRN: [id number]  Chief Complaint  Patient presents with   Establish Care   Subjective:     [name] presents to establish care  HPI  Discussed the use of AI scribe software for clinical note transcription with the patient, who gave verbal consent to proceed.  History of Present Illness   [name] is a 53 year old female here today to establish care.    Reports hx of DM2, chronic pain, panic attacks, migraines, CAD, neuropathy, thyroid  cancer and thyroidectomy.   Medication management issues - Abrupt discontinuation of hydrocodone approximately one month ago following retirement of previous PCP. Patient has not had hydrocodone in one month.  - Patient reports that her past PCP colleagues would not accept her as a patient because they do not prescribe controlled substances. She also stated that local pharmacies would not fulfill prescriptions of controlled medications from Dr. Bertell.  - Recent depletion of Xanax  prescription for anxiety, last dose taken this morning. Reports that her cardiologist refilled it for 14 day supply 2 weeks ago until she could get in with new pcp.   Chronic pain and neuropathy - Peripheral neuropathy with burning sensation and pain in hands and feet - Gabapentin 800 mg four times daily provides partial relief of burning but incomplete pain control - Patient reports being diagnosed with central vestibular dysfunction and not being able to work for the past 4 years. - Reports chronic back pain, states that she has received injections without relief  Migraine headaches - Severe migraines since age [age] - Experiences ten or more migraines per month - Uses Fioricet as needed for migraine management - Is not on preventative migraine medicine  Diabetes mellitus - Currently taking Ozempic , Novolog , and Toujeo  for glycemic control - Last A1C 6.9. Previously as  high as 15 per patient.  - Lost 18 pounds over the past 11 weeks on ozempic .  - Ongoing endocrinology follow-up  Coronary artery disease and hyperlipidemia - She underwent LHC in March 2025 and received LAD PCI.  - Currently taking atorvastatin  and fenofibrate for cholesterol management  Thyroid  cancer, status post thyroidectomy - Thyroidectomy performed over 18 years ago for thyroid  cancer per patient - Regular endocrinology follow-up for diabetes and thyroid  surveillance       Outpatient Encounter Medications as of 10/16/2024  Medication Sig   ALPRAZolam  (XANAX ) 0.5 MG tablet Take 1 tablet (0.5 mg total) by mouth 3 (three) times daily as needed for anxiety.   aspirin  EC 81 MG tablet Take 1 tablet (81 mg total) by mouth daily. Swallow whole.   atorvastatin  (LIPITOR) 40 MG tablet Take 1 tablet (40 mg total) by mouth daily.   B-D UF III MINI PEN NEEDLES 31G X 5 MM MISC USE AND DISCARD 1 PEN      NEEDLE IN THE MORNING, AT   NOON, IN THE EVENING, AND  AT BEDTIME   B-D UF III MINI PEN NEEDLES 31G X 5 MM MISC USE AND DISCARD 1 PEN      NEEDLE 3 TIMES A DAY FOR   SUBCUTANEOUS INJECTION   B-D UF III MINI PEN NEEDLES 31G X 5 MM MISC Use to inject insulin 4 times daily   blood glucose meter kit and supplies KIT Dispense based on patient and insurance preference. Use up to four times daily as directed.   butalbital -apap-caffeine -codeine (FIORICET WITH CODEINE) 50-325-40-30 MG capsule Take  1 capsule by mouth 2 (two) times daily.   CALCIUM  PO Take by mouth.   clopidogrel  (PLAVIX ) 75 MG tablet Take 1 tablet (75 mg total) by mouth daily.   Continuous Glucose Sensor (DEXCOM G7 SENSOR) MISC Inject 1 Application into the skin as directed. Change sensor every 10 days as directed.   diphenhydrAMINE (BENADRYL) 25 MG tablet Take 25 mg by mouth daily as needed for allergies.   DULoxetine (CYMBALTA) 30 MG capsule Take 30 mg by mouth 2 (two) times daily.   fenofibrate micronized (LOFIBRA) 200 MG capsule Take  200 mg by mouth daily before breakfast.   gabapentin (NEURONTIN) 800 MG tablet Take 800 mg by mouth 4 (four) times daily.   hydrOXYzine (ATARAX/VISTARIL) 25 MG tablet Take 25 mg by mouth 4 (four) times daily as needed for itching.   insulin aspart  (NOVOLOG  FLEXPEN) 100 UNIT/ML FlexPen Inject 8-14 Units into the skin 3 (three) times daily with meals.   insulin glargine , 2 Unit Dial, (TOUJEO  MAX SOLOSTAR) 300 UNIT/ML Solostar Pen Inject 50 Units into the skin at bedtime.   Lancets (ONETOUCH DELICA PLUS LANCET33G) MISC Apply 1 each topically 4 (four) times daily.   levothyroxine  (SYNTHROID ) 175 MCG tablet Take 1 tablet (175 mcg total) by mouth daily before breakfast.   lisinopril (ZESTRIL) 2.5 MG tablet Take 2.5 mg by mouth daily.   meclizine (ANTIVERT) 25 MG tablet Take 25 mg by mouth 4 (four) times daily as needed for dizziness.   metoprolol  tartrate (LOPRESSOR ) 25 MG tablet Take 1 tablet (25 mg total) by mouth 2 (two) times daily.   nitrofurantoin , macrocrystal-monohydrate, (MACROBID ) 100 MG capsule Take 1 capsule (100 mg total) by mouth 2 (two) times daily.   nitroGLYCERIN  (NITROSTAT ) 0.4 MG SL tablet Place 1 tablet (0.4 mg total) under the tongue every 5 (five) minutes as needed.   Omega-3 Fatty Acids (FISH OIL PO) Take by mouth.   Semaglutide , 1 MG/DOSE, 4 MG/3ML SOPN Inject 1 mg into the skin once a week. Starting week 9, this will be your maintenance dose   traMADol (ULTRAM) 50 MG tablet Take 100 mg by mouth 3 (three) times daily as needed for moderate pain.   VITAMIN D  PO Take 1 capsule by mouth daily.   [DISCONTINUED] ALPRAZolam  (XANAX ) 0.5 MG tablet Take 0.5 mg by mouth 2 (two) times daily as needed for anxiety (vertigo).   HYDROcodone-acetaminophen  (NORCO) 10-325 MG tablet Take 1 tablet by mouth 3 (three) times daily as needed for severe pain (pain score 7-10). (Patient not taking: Reported on 10/16/2024)   [DISCONTINUED] BIOTIN PO Take by mouth.   [DISCONTINUED]  butalbital -acetaminophen -caffeine  (FIORICET) 50-325-40 MG tablet Take 1 tablet by mouth 2 (two) times daily as needed for headache.   [DISCONTINUED] diazepam (VALIUM) 2 MG tablet Take 2 mg by mouth 2 (two) times daily as needed for anxiety. (Patient not taking: Reported on 10/16/2024)   [DISCONTINUED] Flaxseed, Linseed, (FLAXSEED OIL PO) Take by mouth.   [DISCONTINUED] Melatonin 10 MG TABS Take 10 mg by mouth at bedtime as needed (sleep).   [DISCONTINUED] ONETOUCH VERIO test strip 1 each 4 (four) times daily.   No facility-administered encounter medications on file as of 10/16/2024.    Past Medical History:  Diagnosis Date   Anxiety    BV (bacterial vaginosis)    BV   Cancer (HCC) 2009   papillary thyroid  cancer   Central vestibular vertigo    Depression    Diabetes mellitus without complication (HCC)    Family history of breast  cancer    Hyperlipidemia    Hypertension    Hypothyroidism    IBS (irritable bowel syndrome)    Thyroid  disease     Past Surgical History:  Procedure Laterality Date   ABLATION     CARPAL TUNNEL RELEASE     CARPAL TUNNEL RELEASE Right    CESAREAN SECTION     CORONARY STENT INTERVENTION N/A 02/09/2024   Procedure: CORONARY STENT INTERVENTION;  Surgeon: Anner Alm ORN, MD;  Location: [facility] INVASIVE CV LAB;  Service: Cardiovascular;  Laterality: N/A;   CYST EXCISION N/A 09/12/2022   Procedure: CYST REMOVAL, NECK;  Surgeon: Kallie Manuelita BROCKS, MD;  Location: [facility] ORS;  Service: General;  Laterality: N/A;   LEFT HEART CATH AND CORONARY ANGIOGRAPHY N/A 02/09/2024   Procedure: LEFT HEART CATH AND CORONARY ANGIOGRAPHY;  Surgeon: Anner Alm ORN, MD;  Location[facility] INVASIVE CV LAB;  Service: Cardiovascular;  Laterality: N/A;   LIPOMA EXCISION Left 09/12/2022   Procedure: EXCISION LIPOMA, THIGH;  Surgeon: Kallie Manuelita BROCKS, MD;  Location: [facility] ORS;  Service: General;  Laterality: Left;   NECK AND CHEST LESION     THYROIDECTOMY     Right and Left, 05/2008, 09/2008    WISDOM TOOTH EXTRACTION      Family History  Problem Relation Age of Onset   Breast cancer Maternal Grandmother        died in her late 20s   Hypertension Father    Hyperlipidemia Father    Diabetes Father    Breast cancer Mother        died at age 40   Mental illness Mother    Lung cancer Maternal Uncle     Social History   Socioeconomic History   Marital status: Married    Spouse name: Not on file   Number of children: Not on file   Years of education: Not on file   Highest education level: Associate degree: occupational, scientist, product/process development, or vocational program  Occupational History   Not on file  Tobacco Use   Smoking status: Former   Smokeless tobacco: Never  Vaping Use   Vaping status: Never Used  Substance and Sexual Activity   Alcohol use: Yes    Comment: occ   Drug use: Never   Sexual activity: Yes    Birth control/protection: Surgical    Comment: tubal & ablation  Other Topics Concern   Not on file  Social History Narrative   Not on file   Social Drivers of Health   Financial Resource Strain: Medium Risk (10/12/2024)   Overall Financial Resource Strain (CARDIA)    Difficulty of Paying Living Expenses: Somewhat hard  Food Insecurity: Patient Declined (10/12/2024)   Hunger Vital Sign    Worried About Running Out of Food in the Last Year: Patient declined    Ran Out of Food in the Last Year: Patient declined  Transportation Needs: Patient Declined (10/12/2024)   PRAPARE - Administrator, Civil Service (Medical): Patient declined    Lack of Transportation (Non-Medical): Patient declined  Physical Activity: Inactive (10/12/2024)   Exercise Vital Sign    Days of Exercise per Week: 0 days    Minutes of Exercise per Session: Not on file  Stress: Stress Concern Present (10/12/2024)   [employer] of Occupational Health - Occupational Stress Questionnaire    Feeling of Stress: To some extent  Social Connections: Moderately Integrated (10/12/2024)    Social Connection and Isolation Panel    Frequency of Communication with Friends  and Family: Three times a week    Frequency of Social Gatherings with Friends and Family: Never    Attends Religious Services: 1 to 4 times per year    Active Member of [employer] or Organizations: No    Attends Engineer, Structural: Not on file    Marital Status: Married  Catering Manager Violence: Not At Risk (10/20/2023)   Humiliation, Afraid, Rape, and Kick questionnaire    Fear of Current or Ex-Partner: No    Emotionally Abused: No    Physically Abused: No    Sexually Abused: No    ROS    Objective:    BP (!) 98/59   Pulse 65   Temp 97.7 F (36.5 C)   Ht 5' 4.5 (1.638 m)   Wt 204 lb 12.8 oz (92.9 kg)   SpO2 97%   BMI 34.61 kg/m   Physical Exam Vitals reviewed.  Constitutional:      Appearance: Normal appearance.  HENT:     Head: Normocephalic and atraumatic.  Eyes:     Extraocular Movements: Extraocular movements intact.     Conjunctiva/sclera: Conjunctivae normal.     Pupils: Pupils are equal, round, and reactive to light.  Cardiovascular:     Rate and Rhythm: Normal rate and regular rhythm.     Pulses: Normal pulses.     Heart sounds: Normal heart sounds. No murmur heard. Pulmonary:     Effort: Pulmonary effort is normal. No respiratory distress.     Breath sounds: Normal breath sounds.  Musculoskeletal:        General: No deformity. Normal range of motion.     Cervical back: Normal range of motion.  Skin:    General: Skin is warm and dry.     Capillary Refill: Capillary refill takes less than 2 seconds.  Neurological:     General: No focal deficit present.     Mental Status: She is alert and oriented to person, place, and time.  Psychiatric:        Mood and Affect: Mood normal.        Behavior: Behavior normal.          Assessment & Plan:   Problem List Items Addressed This Visit       Cardiovascular and Mediastinum   Essential hypertension (Chronic)    CAD (coronary artery disease) (Chronic)   Migraine   Relevant Medications   butalbital -apap-caffeine -codeine (FIORICET WITH CODEINE) 50-325-40-30 MG capsule   Other Relevant Orders   Ambulatory referral to Neurology     Endocrine   Diabetes mellitus (HCC) - Primary (Chronic)   Diabetic peripheral neuropathy (HCC)   Relevant Medications   ALPRAZolam  (XANAX ) 0.5 MG tablet   Malignant neoplasm of thyroid  gland (HCC)   Relevant Medications   ALPRAZolam  (XANAX ) 0.5 MG tablet   Other Visit Diagnoses       Panic attacks       Relevant Medications   ALPRAZolam  (XANAX ) 0.5 MG tablet   Other Relevant Orders   Ambulatory referral to Psychiatry     H/O thyroidectomy         GAD (generalized anxiety disorder)       Relevant Medications   ALPRAZolam  (XANAX ) 0.5 MG tablet   Other Relevant Orders   Ambulatory referral to Psychiatry     Other chronic pain       Relevant Medications   butalbital -apap-caffeine -codeine (FIORICET WITH CODEINE) 50-325-40-30 MG capsule   Other Relevant Orders   Ambulatory referral to Pain Clinic  Depression, unspecified depression type       Relevant Medications   ALPRAZolam  (XANAX ) 0.5 MG tablet   Other Relevant Orders   Ambulatory referral to Psychiatry     Polypharmacy         Encounter for immunization       Relevant Orders   Flu vaccine trivalent PF, 6mos and older(Flulaval,Afluria,Fluarix,Fluzone) (Completed)       Assessment and Plan    Type 2 diabetes mellitus with peripheral neuropathy Diabetes improving with last A1c of 6.9. Peripheral neuropathy likely secondary to diabetes, with burning pain in hands and feet. Gabapentin provides partial relief.  - Continue Ozempic , Novolog , and Toujeo . - Continue gabapentin. - Continue f/u with endocrinology  Hx of thyroid  cancer and thyroidectomy - Continue f/u with endocrinology  Chronic migraine Chronic migraines more than ten times a month. Patient is not on any preventative medications.  -  Referred to neurology.  Chronic pain Chronic pain syndrome with history of long-term hydrocodone use, recently discontinued. Gabapentin provides some relief.  - Referred to pain management.  Coronary artery disease  On atorvastatin  and fenofibrate for cholesterol management. Regular cardiology follow-up ongoing. - Continue atorvastatin  and fenofibrate. - Continue follow-up with cardiology  Depression and anxiety Managed with duloxetine and alprazolam . Will refill alprazolam  x 1 month to bridge patient to psychiatry - Referred to psychiatry.     PDMP reveals that the patient was previously receiving xanax , valium, tramadol, fioricet, gabapentin, and norco. Discussed risk of polypharmacy with the patient. Referral to neurology, psych, and pain management placed to help manage.    Return in about 3 months (around 01/16/2025).   Oneil LELON Severin, FNP [facility]

## 2024-10-17 ENCOUNTER — Institutional Professional Consult (permissible substitution) (INDEPENDENT_AMBULATORY_CARE_PROVIDER_SITE_OTHER): Admitting: Otolaryngology

## 2024-11-04 ENCOUNTER — Encounter: Payer: Self-pay | Admitting: Nurse Practitioner

## 2024-11-04 DIAGNOSIS — Z114 Encounter for screening for human immunodeficiency virus [HIV]: Secondary | ICD-10-CM | POA: Diagnosis not present

## 2024-11-04 DIAGNOSIS — M130 Polyarthritis, unspecified: Secondary | ICD-10-CM | POA: Diagnosis not present

## 2024-11-04 DIAGNOSIS — Z789 Other specified health status: Secondary | ICD-10-CM | POA: Diagnosis not present

## 2024-11-04 DIAGNOSIS — E1169 Type 2 diabetes mellitus with other specified complication: Secondary | ICD-10-CM | POA: Diagnosis not present

## 2024-11-04 DIAGNOSIS — E6609 Other obesity due to excess calories: Secondary | ICD-10-CM | POA: Diagnosis not present

## 2024-11-04 DIAGNOSIS — Z6833 Body mass index (BMI) 33.0-33.9, adult: Secondary | ICD-10-CM | POA: Diagnosis not present

## 2024-11-04 DIAGNOSIS — Z79899 Other long term (current) drug therapy: Secondary | ICD-10-CM | POA: Diagnosis not present

## 2024-11-04 NOTE — Telephone Encounter (Signed)
 Have you seen anything back about her PAP for her insulin?

## 2024-11-05 NOTE — Telephone Encounter (Signed)
 No, I have not. Patient may want to reach out to her PAP.

## 2024-11-07 ENCOUNTER — Other Ambulatory Visit: Payer: Self-pay | Admitting: Family Medicine

## 2024-11-07 DIAGNOSIS — F410 Panic disorder [episodic paroxysmal anxiety] without agoraphobia: Secondary | ICD-10-CM

## 2024-11-07 MED ORDER — ALPRAZOLAM 0.5 MG PO TABS: 0.5000 mg | ORAL_TABLET | Freq: Three times a day (TID) | ORAL | 0 refills | Status: DC | PRN

## 2024-11-13 ENCOUNTER — Ambulatory Visit

## 2024-11-13 VITALS — BP 98/59 | HR 65 | Ht 64.0 in | Wt 204.0 lb

## 2024-11-13 DIAGNOSIS — Z0000 Encounter for general adult medical examination without abnormal findings: Secondary | ICD-10-CM | POA: Diagnosis not present

## 2024-11-13 NOTE — Patient Instructions (Signed)
 [name],  Thank you for taking the time for your Medicare Wellness Visit. I appreciate your continued commitment to your health goals. Please review the care plan we discussed, and feel free to reach out if I can assist you further.  Please note that Annual Wellness Visits do not include a physical exam. Some assessments may be limited, especially if the visit was conducted virtually. If needed, we may recommend an in-person follow-up with your provider.  Ongoing Care Seeing your primary care provider every 3 to 6 months helps us  monitor your health and provide consistent, personalized care.   Referrals If a referral was made during today's visit and you haven't received any updates within two weeks, please contact the referred provider directly to check on the status.  Recommended Screenings:  Health Maintenance  Topic Date Due   Medicare Annual Wellness Visit  Never done   COVID-19 Vaccine (1) Never done   Eye exam for diabetics  Never done   HIV Screening  Never done   Hepatitis C Screening  Never done   DTaP/Tdap/Td vaccine (1 - Tdap) Never done   Pneumococcal Vaccine for age over 40 (1 of 2 - PCV) Never done   Hepatitis B Vaccine (1 of 3 - 19+ 3-dose series) Never done   Zoster (Shingles) Vaccine (1 of 2) Never done   Yearly kidney health urinalysis for diabetes  03/22/2024   Complete foot exam   03/22/2024   Hemoglobin A1C  03/07/2025   Cologuard (Stool DNA test)  05/18/2025   Breast Cancer Screening  06/04/2025   Yearly kidney function blood test for diabetes  07/23/2025   Pap with HPV screening  10/19/2028   Flu Shot  Completed   HPV Vaccine  Aged Out   Meningitis B Vaccine  Aged Out       11/13/2024    9:33 AM  Advanced Directives  Does Patient Have a Medical Advance Directive? No    Vision: Annual vision screenings are recommended for early detection of glaucoma, cataracts, and diabetic retinopathy. These exams can also reveal signs of chronic conditions such  as diabetes and high blood pressure.  Dental: Annual dental screenings help detect early signs of oral cancer, gum disease, and other conditions linked to overall health, including heart disease and diabetes.  Please see the attached documents for additional preventive care recommendations.

## 2024-11-13 NOTE — Progress Notes (Signed)
 Chief Complaint  Patient presents with   Medicare Wellness     Subjective:   [name] is a 53 y.o. female who presents for a Medicare Annual Wellness Visit.  Visit info / Clinical Intake: Medicare Wellness Visit Type:: Subsequent Annual Wellness Visit Persons participating in visit and providing information:: patient Medicare Wellness Visit Mode:: Video Since this visit was completed virtually, some vitals may be partially provided or unavailable. Missing vitals are due to the limitations of the virtual format.: Documented vitals are patient reported If Telephone or Video please confirm:: I connected with patient using audio/video enable telemedicine. I verified patient identity with two identifiers, discussed telehealth limitations, and patient agreed to proceed. Patient Location:: home Provider Location:: office Interpreter Needed?: No Pre-visit prep was completed: yes AWV questionnaire completed by patient prior to visit?: yes Date:: 11/10/24 Living arrangements:: (Patient-Rptd) lives with spouse/significant other Patient's Overall Health Status Rating: (!) (Patient-Rptd) fair Typical amount of pain: (!) (Patient-Rptd) a lot Does pain affect daily life?: (!) (Patient-Rptd) yes Are you currently prescribed opioids?: no  Dietary Habits and Nutritional Risks How many meals a day?: (Patient-Rptd) 3 Eats fruit and vegetables daily?: (!) (Patient-Rptd) no Most meals are obtained by: (Patient-Rptd) having others provide food In the last 2 weeks, have you had any of the following?: (!) nausea, vomiting, diarrhea Diabetic:: (!) yes Any non-healing wounds?: no How often do you check your BS?: continuous glucose monitor; 2  Functional Status Activities of Daily Living (to include ambulation/medication): (!) (Patient-Rptd) Needs Assist Ambulation: (Patient-Rptd) Independent Medication Administration: (Patient-Rptd) Independent Home Management (perform basic housework or  laundry): (Patient-Rptd) Needs assistance (comment) Manage your own finances?: (Patient-Rptd) yes Primary transportation is: (Patient-Rptd) family / friends Concerns about vision?: no *vision screening is required for WTM* (vision is not update last 33yrs ago) Concerns about hearing?: (!) yes (appt in Dr Darel? ringing in the ears) Uses hearing aids?: no  Fall Screening Falls in the past year?: (Patient-Rptd) 1 Number of falls in past year: (Patient-Rptd) 1 Was there an injury with Fall?: (Patient-Rptd) 0 Fall Risk Category Calculator: (Patient-Rptd) 2 Patient Fall Risk Level: (Patient-Rptd) Moderate Fall Risk  Fall Risk Patient at Risk for Falls Due to: Impaired balance/gait Fall risk Follow up: Falls evaluation completed; Education provided  Home and Transportation Safety: All rugs have non-skid backing?: (Patient-Rptd) yes All stairs or steps have railings?: (Patient-Rptd) yes Grab bars in the bathtub or shower?: (Patient-Rptd) yes Have non-skid surface in bathtub or shower?: (Patient-Rptd) yes Good home lighting?: (Patient-Rptd) yes Regular seat belt use?: (Patient-Rptd) yes Hospital stays in the last year:: (Patient-Rptd) no  Cognitive Assessment Difficulty concentrating, remembering, or making decisions? : (Patient-Rptd) yes Will 6CIT or Mini Cog be Completed: yes What year is it?: 0 points What month is it?: 0 points Give patient an address phrase to remember (5 components): [address] [place], [place] About what time is it?: 0 points Count backwards from 20 to 1: 0 points Say the months of the year in reverse: 0 points Repeat the address phrase from earlier: 0 points 6 CIT Score: 0 points  Advance Directives (For Healthcare) Does Patient Have a Medical Advance Directive?: No Would patient like information on creating a medical advance directive?: No - Patient declined  Reviewed/Updated  Reviewed/Updated: Reviewed All (Medical, Surgical, Family, Medications, Allergies,  Care Teams, Patient Goals); Family History; Surgical History; Medical History; Medications; Allergies; Care Teams; Patient Goals    Allergies (verified) Aspirin , Sulfa antibiotics, and Sulfamethoxazole-trimethoprim   Current Medications (verified) Outpatient  Encounter Medications as of 11/13/2024  Medication Sig   [START ON 11/14/2024] ALPRAZolam  (XANAX ) 0.5 MG tablet Take 1 tablet (0.5 mg total) by mouth 3 (three) times daily as needed for anxiety.   aspirin  EC 81 MG tablet Take 1 tablet (81 mg total) by mouth daily. Swallow whole.   atorvastatin  (LIPITOR) 40 MG tablet Take 1 tablet (40 mg total) by mouth daily.   B-D UF III MINI PEN NEEDLES 31G X 5 MM MISC USE AND DISCARD 1 PEN      NEEDLE IN THE MORNING, AT   NOON, IN THE EVENING, AND  AT BEDTIME   B-D UF III MINI PEN NEEDLES 31G X 5 MM MISC USE AND DISCARD 1 PEN      NEEDLE 3 TIMES A DAY FOR   SUBCUTANEOUS INJECTION   B-D UF III MINI PEN NEEDLES 31G X 5 MM MISC Use to inject insulin 4 times daily   blood glucose meter kit and supplies KIT Dispense based on patient and insurance preference. Use up to four times daily as directed.   butalbital -apap-caffeine -codeine (FIORICET WITH CODEINE) 50-325-40-30 MG capsule Take 1 capsule by mouth 2 (two) times daily.   CALCIUM  PO Take by mouth.   clopidogrel  (PLAVIX ) 75 MG tablet Take 1 tablet (75 mg total) by mouth daily.   Continuous Glucose Sensor (DEXCOM G7 SENSOR) MISC Inject 1 Application into the skin as directed. Change sensor every 10 days as directed.   diphenhydrAMINE (BENADRYL) 25 MG tablet Take 25 mg by mouth daily as needed for allergies.   DULoxetine (CYMBALTA) 30 MG capsule Take 30 mg by mouth 2 (two) times daily.   fenofibrate micronized (LOFIBRA) 200 MG capsule Take 200 mg by mouth daily before breakfast.   gabapentin (NEURONTIN) 800 MG tablet Take 800 mg by mouth 4 (four) times daily.   hydrOXYzine (ATARAX/VISTARIL) 25 MG tablet Take 25 mg by mouth 4 (four) times daily as needed  for itching.   insulin aspart  (NOVOLOG  FLEXPEN) 100 UNIT/ML FlexPen Inject 8-14 Units into the skin 3 (three) times daily with meals.   insulin glargine , 2 Unit Dial, (TOUJEO  MAX SOLOSTAR) 300 UNIT/ML Solostar Pen Inject 50 Units into the skin at bedtime.   Lancets (ONETOUCH DELICA PLUS LANCET33G) MISC Apply 1 each topically 4 (four) times daily.   levothyroxine  (SYNTHROID ) 175 MCG tablet Take 1 tablet (175 mcg total) by mouth daily before breakfast.   lisinopril (ZESTRIL) 2.5 MG tablet Take 2.5 mg by mouth daily.   meclizine (ANTIVERT) 25 MG tablet Take 25 mg by mouth 4 (four) times daily as needed for dizziness.   metoprolol  tartrate (LOPRESSOR ) 25 MG tablet Take 1 tablet (25 mg total) by mouth 2 (two) times daily.   nitrofurantoin , macrocrystal-monohydrate, (MACROBID ) 100 MG capsule Take 1 capsule (100 mg total) by mouth 2 (two) times daily.   nitroGLYCERIN  (NITROSTAT ) 0.4 MG SL tablet Place 1 tablet (0.4 mg total) under the tongue every 5 (five) minutes as needed.   Omega-3 Fatty Acids (FISH OIL PO) Take by mouth.   Semaglutide , 1 MG/DOSE, 4 MG/3ML SOPN Inject 1 mg into the skin once a week. Starting week 9, this will be your maintenance dose   traMADol (ULTRAM) 50 MG tablet Take 100 mg by mouth 3 (three) times daily as needed for moderate pain.   VITAMIN D  PO Take 1 capsule by mouth daily.   HYDROcodone-acetaminophen  (NORCO) 10-325 MG tablet Take 1 tablet by mouth 3 (three) times daily as needed for severe pain (pain score 7-10). (  Patient not taking: Reported on 11/13/2024)   No facility-administered encounter medications on file as of 11/13/2024.    History: Past Medical History:  Diagnosis Date   Allergy    Anxiety    Arthritis    BV (bacterial vaginosis)    BV   Cancer (HCC) 2009   papillary thyroid  cancer   Central vestibular vertigo    Depression    Diabetes mellitus without complication (HCC)    Family history of breast cancer    Heart murmur    Hyperlipidemia     Hypertension    Hypothyroidism    IBS (irritable bowel syndrome)    Neuromuscular disorder (HCC)    Thyroid  disease    Past Surgical History:  Procedure Laterality Date   ABLATION     CARPAL TUNNEL RELEASE     CARPAL TUNNEL RELEASE Right    CESAREAN SECTION     CORONARY STENT INTERVENTION N/A 02/09/2024   Procedure: CORONARY STENT INTERVENTION;  Surgeon: Anner Alm ORN, MD;  Location: [facility];  Service: Cardiovascular;  Laterality: N/A;   CYST EXCISION N/A 09/12/2022   Procedure: CYST REMOVAL, NECK;  Surgeon: Kallie Manuelita BROCKS, MD;  Location: [facility];  Service: General;  Laterality: N/A;   LEFT HEART CATH AND CORONARY ANGIOGRAPHY N/A 02/09/2024   Procedure: LEFT HEART CATH AND CORONARY ANGIOGRAPHY;  Surgeon: Anner Alm ORN, MD;  Location: [facility] INVASIVE CV LAB;  Service: Cardiovascular;  Laterality: N/A;   LIPOMA EXCISION Left 09/12/2022   Procedure: EXCISION LIPOMA, THIGH;  Surgeon: Kallie Manuelita BROCKS, MD;  Location: [facility];  Service: General;  Laterality: Left;   NECK AND CHEST LESION     THYROIDECTOMY     Right and Left, 05/2008, 09/2008   TUBAL LIGATION     WISDOM TOOTH EXTRACTION     Family History  Problem Relation Age of Onset   Arthritis Paternal Grandmother    Breast cancer Maternal Grandmother        died in her late 15s   Cancer Maternal Grandmother    Hypertension Father    Hyperlipidemia Father    Diabetes Father    Arthritis Father    COPD Father    Kidney disease Father    Breast cancer Mother        died at age 27   Mental illness Mother    Alcohol abuse Mother    Cancer Mother    Depression Mother    Drug abuse Mother    Early death Mother    Obesity Mother    Lung cancer Maternal Uncle    Cancer Maternal Uncle    Cancer Paternal Uncle    Diabetes Paternal Uncle    Early death Paternal Uncle    Arthritis Paternal Grandfather    Heart disease Paternal Grandfather    Anxiety disorder Paternal Aunt    Arthritis Paternal Aunt    COPD  Paternal Aunt    Depression Paternal Aunt    Asthma Daughter    Hearing loss Brother    Social History   Occupational History   Not on file  Tobacco Use   Smoking status: Former   Smokeless tobacco: Never  [employer]vertising Account [employer]   Vaping status: Never Used  Substance and Sexual Activity   Alcohol use: Yes    Comment: occ   Drug use: Never   Sexual activity: Yes    Birth control/protection: Surgical    Comment: tubal & ablation   Tobacco Counseling Counseling given: Not Answered  SDOH Screenings   Food Insecurity: Patient Declined (11/13/2024)  Housing: High Risk (11/13/2024)  Transportation Needs: Patient Declined (11/13/2024)  Utilities: Not At Risk (11/13/2024)  Alcohol Screen: Low Risk  (10/20/2023)  Depression (PHQ2-9): High Risk (11/13/2024)  Financial Resource Strain: Medium Risk (10/12/2024)  Physical Activity: Inactive (11/13/2024)  Social Connections: Moderately Integrated (11/13/2024)  Stress: Stress Concern Present (10/12/2024)  Tobacco Use: Medium Risk (09/06/2024)  Health Literacy: Adequate Health Literacy (11/13/2024)   See flowsheets for full screening details  Depression Screen PHQ 2 & 9 Depression Scale- Over the past 2 weeks, how often have you been bothered by any of the following problems? Little interest or pleasure in doing things: 1 Feeling down, depressed, or hopeless (PHQ Adolescent also includes...irritable): 3 PHQ-2 Total Score: 4 Trouble falling or staying asleep, or sleeping too much: 2 Feeling tired or having little energy: 3 Poor appetite or overeating (PHQ Adolescent also includes...weight loss): 0 Feeling bad about yourself - or that you are a failure or have let yourself or your family down: 1 Trouble concentrating on things, such as reading the newspaper or watching television (PHQ Adolescent also includes...like school work): 1 Moving or speaking so slowly that other people could have noticed. Or the opposite - being so fidgety or restless that  you have been moving around a lot more than usual: 0 Thoughts that you would be better off dead, or of hurting yourself in some way: 0 PHQ-9 Total Score: 11 If you checked off any problems, how difficult have these problems made it for you to do your work, take care of things at home, or get along with other people?: Very difficult  Depression Treatment Depression Interventions/Treatment : Medication     Goals Addressed   None          Objective:    There were no vitals filed for this visit. There is no height or weight on file to calculate BMI.  Hearing/Vision screen No results found. Immunizations and Health Maintenance Health Maintenance  Topic Date Due   COVID-19 Vaccine (1) Never done   OPHTHALMOLOGY EXAM  Never done   HIV Screening  Never done   Hepatitis C Screening  Never done   DTaP/Tdap/Td (1 - Tdap) Never done   Pneumococcal Vaccine: 50+ Years (1 of 2 - PCV) Never done   Hepatitis B Vaccines 19-59 Average Risk (1 of 3 - 19+ 3-dose series) Never done   Zoster Vaccines- Shingrix (1 of 2) Never done   Diabetic kidney evaluation - Urine ACR  03/22/2024   FOOT EXAM  03/22/2024   HEMOGLOBIN A1C  03/07/2025   Fecal DNA (Cologuard)  05/18/2025   Mammogram  06/04/2025   Diabetic kidney evaluation - eGFR measurement  07/23/2025   Medicare Annual Wellness (AWV)  11/13/2025   Cervical Cancer Screening (HPV/Pap Cotest)  10/19/2028   Influenza Vaccine  Completed   HPV VACCINES  Aged Out   Meningococcal B Vaccine  Aged Out        Assessment/Plan:  This is a routine wellness examination for [name].  Patient Care Team: Alcus Oneil ORN, FNP as PCP - General (Family Medicine) Mallipeddi, Diannah SQUIBB, MD as PCP - Cardiology (Cardiology)  I have personally reviewed and noted the following in the patients chart:   Medical and social history Use of alcohol, tobacco or illicit drugs  Current medications and supplements including opioid prescriptions. Functional ability and  status Nutritional status Physical activity Advanced directives List of other physicians Hospitalizations, surgeries, and ER  visits in previous 12 months Vitals Screenings to include cognitive, depression, and falls Referrals and appointments  No orders of the defined types were placed in this encounter.  In addition, I have reviewed and discussed with patient certain preventive protocols, quality metrics, and best practice recommendations. A written personalized care plan for preventive services as well as general preventive health recommendations were provided to patient.   Ozie Ned, CMA   11/13/2024   Return in 1 year (on 11/13/2025).  After Visit Summary: (MyChart) Due to this being a telephonic visit, the after visit summary with patients personalized plan was offered to patient via MyChart   Nurse Notes: pt is aware and due the following: No voiced or noted concerns at this time Appointment(s) made: (11/14/24 at 9:20am) HM Addressed: Vaccines Due: DTAP, Pneumonia, Hep B, shingles, covid vaccines, Foot exam, Diabetic exam Labs Due UrineA1c, Hep C

## 2024-11-18 NOTE — Telephone Encounter (Signed)
 I can see that we did something under media back in October.  I guess they didn't get it.  The one scanned looks a bit blurry, we may need to redo, unless you maybe kept a copy?

## 2024-11-19 ENCOUNTER — Ambulatory Visit: Attending: Internal Medicine | Admitting: Internal Medicine

## 2024-11-19 ENCOUNTER — Encounter: Payer: Self-pay | Admitting: Internal Medicine

## 2024-11-19 VITALS — BP 114/82 | HR 72 | Ht 64.0 in | Wt 207.0 lb

## 2024-11-19 DIAGNOSIS — Z9861 Coronary angioplasty status: Secondary | ICD-10-CM | POA: Diagnosis not present

## 2024-11-19 DIAGNOSIS — E7800 Pure hypercholesterolemia, unspecified: Secondary | ICD-10-CM | POA: Diagnosis not present

## 2024-11-19 DIAGNOSIS — I2510 Atherosclerotic heart disease of native coronary artery without angina pectoris: Secondary | ICD-10-CM

## 2024-11-19 DIAGNOSIS — I10 Essential (primary) hypertension: Secondary | ICD-10-CM

## 2024-11-19 DIAGNOSIS — Z7902 Long term (current) use of antithrombotics/antiplatelets: Secondary | ICD-10-CM | POA: Insufficient documentation

## 2024-11-19 MED ORDER — CLOPIDOGREL BISULFATE 75 MG PO TABS: 75.0000 mg | ORAL_TABLET | Freq: Every day | ORAL | 0 refills | Status: AC

## 2024-11-19 MED ORDER — METOPROLOL TARTRATE 25 MG PO TABS: 12.5000 mg | ORAL_TABLET | Freq: Two times a day (BID) | ORAL | 3 refills | Status: AC

## 2024-11-19 NOTE — Patient Instructions (Addendum)
 Medication Instructions:  Your physician has recommended you make the following change in your medication:  Decrease Metoprolol  Tartrate from 25 mg to 12.5 mg twice daily  Stop taking Plavix  on February 09, 2025 Continue taking all other medications as prescribed   Labwork: Requested labs [facility]  Lipoprotein-a at [place] at [facility]   Testing/Procedures: None  Follow-Up: Your physician recommends that you schedule a follow-up appointment in: 10 months  Any Other Special Instructions Will Be Listed Below (If Applicable). Referral to Cardiac Rehab  Thank you for choosing [facility]!     If you need a refill on your cardiac medications before your next appointment, please call your pharmacy.

## 2024-11-19 NOTE — Progress Notes (Signed)
 Cardiology Office Note  Date: 11/19/2024   ID: [name], DOB January 22, 1971, MRN [id number]  PCP:  Alcus Oneil ORN, FNP  Cardiologist:  Diannah SHAUNNA Maywood, MD Electrophysiologist:  None   History of Present Illness: [name] is a 53 y.o. female known to have HTN, insulin-dependent diabetes mellitus type 2, autoimmune thyroiditis on thyroid  supplements is here for follow-up visit.  Patient was initially referred to cardiology clinic for evaluation of chest pain.  She was having exertional chest pain (exertional activities include climbing stairs and walking her dog) 2 times per week and lasting for minutes. Due to cardiac risk factors including HTN and insulin-dependent diabetes mellitus, she underwent CT cardiac and echocardiogram.  CT cardiac showed coronary calcium  score of 67 (95th percentile for age and sex matched control), total plaque volume is 109, 75th percentile for age and sex matched control.  There was evidence of moderate soft plaque stenosis in the mid LAD followed by a severe soft plaque stenosis (70-99%).  There is low-attenuation plaque over here.  CT FFR analysis showed significant functional stenosis in the mid to distal LAD area.  Otherwise, there is mild nonobstructive CAD in the remainder of the vessels.  There is evidence of soft plaque in all the coronary arteries.  Echocardiogram showed normal LVEF and no valvular heart disease.  She underwent LHC in March 2025 and received LAD PCI.  Patient had a history of papillary thyroid  cancer in the past and had total thyroidectomy with no thyroid  bed.  I reviewed the report and hence started her on Ozempic .  She is tolerating Ozempic  very well.  She is here today for follow-up visit.  She has chest pains with anxiety but no chest pains with exertion.  No DOE.  Sometimes she has palpitations.  She also has almost daily dizziness which she describes as letters becoming flowy when she tries to read  She was not evaluated by  ophthalmology recently.  These dizziness symptoms are chronic and not new since starting Ozempic .  She also had falls from this.  Patient had endometrial ablation in the past, does not have periods. Has ovaries and uterus.   Past Medical History:  Diagnosis Date   Allergy    Anxiety    Arthritis    BV (bacterial vaginosis)    BV   Cancer (HCC) 2009   papillary thyroid  cancer   Central vestibular vertigo    Depression    Diabetes mellitus without complication (HCC)    Family history of breast cancer    Heart murmur    Hyperlipidemia    Hypertension    Hypothyroidism    IBS (irritable bowel syndrome)    Neuromuscular disorder (HCC)    Thyroid  disease     Past Surgical History:  Procedure Laterality Date   ABLATION     CARPAL TUNNEL RELEASE     CARPAL TUNNEL RELEASE Right    CESAREAN SECTION     CORONARY STENT INTERVENTION N/A 02/09/2024   Procedure: CORONARY STENT INTERVENTION;  Surgeon: Anner Alm ORN, MD;  Location: [facility] INVASIVE [facility];  Service: Cardiovascular;  Laterality: N/A;   CYST EXCISION N/A 09/12/2022   Procedure: CYST REMOVAL, NECK;  Surgeon: Kallie Manuelita BROCKS, MD;  Location: [facility];  Service: General;  Laterality: N/A;   LEFT HEART CATH AND CORONARY ANGIOGRAPHY N/A 02/09/2024   Procedure: LEFT HEART CATH AND CORONARY ANGIOGRAPHY;  Surgeon: Anner Alm ORN, MD;  Location: [facility] INVASIVE CV LAB;  Service: Cardiovascular;  Laterality: N/A;   LIPOMA EXCISION Left 09/12/2022   Procedure: EXCISION LIPOMA, THIGH;  Surgeon: Kallie Manuelita BROCKS, MD;  Location: [facility];  Service: General;  Laterality: Left;   NECK AND CHEST LESION     THYROIDECTOMY     Right and Left, 05/2008, 09/2008   TUBAL LIGATION     WISDOM TOOTH EXTRACTION      Current Outpatient Medications  Medication Sig Dispense Refill   ALPRAZolam  (XANAX ) 0.5 MG tablet Take 1 tablet (0.5 mg total) by mouth 3 (three) times daily as needed for anxiety. 90 tablet 0   aspirin  EC 81 MG tablet Take 1 tablet (81  mg total) by mouth daily. Swallow whole. 30 tablet 5   atorvastatin  (LIPITOR) 40 MG tablet Take 1 tablet (40 mg total) by mouth daily. 30 tablet 3   B-D UF III MINI PEN NEEDLES 31G X 5 MM MISC USE AND DISCARD 1 PEN      NEEDLE IN THE MORNING, AT   NOON, IN THE EVENING, AND  AT BEDTIME 100 each 0   B-D UF III MINI PEN NEEDLES 31G X 5 MM MISC USE AND DISCARD 1 PEN      NEEDLE 3 TIMES A DAY FOR   SUBCUTANEOUS INJECTION 270 each 1   B-D UF III MINI PEN NEEDLES 31G X 5 MM MISC Use to inject insulin 4 times daily 300 each 3   blood glucose meter kit and supplies KIT Dispense based on patient and insurance preference. Use up to four times daily as directed. 1 each 0   butalbital -apap-caffeine -codeine (FIORICET WITH CODEINE) 50-325-40-30 MG capsule Take 1 capsule by mouth 2 (two) times daily.     CALCIUM  PO Take by mouth.     clopidogrel  (PLAVIX ) 75 MG tablet Take 1 tablet (75 mg total) by mouth daily. 90 tablet 2   Continuous Glucose Sensor (DEXCOM G7 SENSOR) MISC Inject 1 Application into the skin as directed. Change sensor every 10 days as directed. 9 each 3   diphenhydrAMINE (BENADRYL) 25 MG tablet Take 25 mg by mouth daily as needed for allergies.     DULoxetine (CYMBALTA) 30 MG capsule Take 30 mg by mouth 2 (two) times daily.     fenofibrate micronized (LOFIBRA) 200 MG capsule Take 200 mg by mouth daily before breakfast.     gabapentin (NEURONTIN) 800 MG tablet Take 800 mg by mouth 4 (four) times daily.     HYDROcodone-acetaminophen  (NORCO) 10-325 MG tablet Take 1 tablet by mouth 3 (three) times daily as needed for severe pain (pain score 7-10). (Patient not taking: Reported on 11/13/2024)     hydrOXYzine (ATARAX/VISTARIL) 25 MG tablet Take 25 mg by mouth 4 (four) times daily as needed for itching.     insulin aspart  (NOVOLOG  FLEXPEN) 100 UNIT/ML FlexPen Inject 8-14 Units into the skin 3 (three) times daily with meals. 45 mL 3   insulin glargine , 2 Unit Dial, (TOUJEO  MAX SOLOSTAR) 300 UNIT/ML Solostar  Pen Inject 50 Units into the skin at bedtime. 15 mL 3   Lancets (ONETOUCH DELICA PLUS LANCET33G) MISC Apply 1 each topically 4 (four) times daily.     levothyroxine  (SYNTHROID ) 175 MCG tablet Take 1 tablet (175 mcg total) by mouth daily before breakfast. 90 tablet 1   lisinopril (ZESTRIL) 2.5 MG tablet Take 2.5 mg by mouth daily.     meclizine (ANTIVERT) 25 MG tablet Take 25 mg by mouth 4 (four) times daily as needed for dizziness.     metoprolol  tartrate (  LOPRESSOR ) 25 MG tablet Take 1 tablet (25 mg total) by mouth 2 (two) times daily. 180 tablet 3   nitrofurantoin , macrocrystal-monohydrate, (MACROBID ) 100 MG capsule Take 1 capsule (100 mg total) by mouth 2 (two) times daily. 14 capsule 0   nitroGLYCERIN  (NITROSTAT ) 0.4 MG SL tablet Place 1 tablet (0.4 mg total) under the tongue every 5 (five) minutes as needed. 25 tablet 2   Omega-3 Fatty Acids (FISH OIL PO) Take by mouth.     Semaglutide , 1 MG/DOSE, 4 MG/3ML SOPN Inject 1 mg into the skin once a week. Starting week 9, this will be your maintenance dose 3 mL 2   traMADol (ULTRAM) 50 MG tablet Take 100 mg by mouth 3 (three) times daily as needed for moderate pain.     VITAMIN D  PO Take 1 capsule by mouth daily.     No current facility-administered medications for this visit.   Allergies:  Aspirin , Sulfa antibiotics, and Sulfamethoxazole-trimethoprim   Social History: The patient  reports that she has quit smoking. She has never used smokeless tobacco. She reports current alcohol use. She reports that she does not use drugs.   Family History: The patient's family history includes Alcohol abuse in her mother; Anxiety disorder in her paternal aunt; Arthritis in her father, paternal aunt, paternal grandfather, and paternal grandmother; Asthma in her daughter; Breast cancer in her maternal grandmother and mother; COPD in her father and paternal aunt; Cancer in her maternal grandmother, maternal uncle, mother, and paternal uncle; Depression in her  mother and paternal aunt; Diabetes in her father and paternal uncle; Drug abuse in her mother; Early death in her mother and paternal uncle; Hearing loss in her brother; Heart disease in her paternal grandfather; Hyperlipidemia in her father; Hypertension in her father; Kidney disease in her father; Lung cancer in her maternal uncle; Mental illness in her mother; Obesity in her mother.   ROS:  Please see the history of present illness. Otherwise, complete review of systems is positive for none  All other systems are reviewed and negative.   Physical Exam: VS:  There were no vitals taken for this visit., BMI There is no height or weight on file to calculate BMI.  Wt Readings from Last 3 Encounters:  11/13/24 204 lb (92.5 kg)  10/16/24 204 lb 12.8 oz (92.9 kg)  09/06/24 216 lb 9.6 oz (98.2 kg)    General: Patient appears comfortable at rest. HEENT: Conjunctiva and lids normal Neck: Supple Lungs: Clear to auscultation, nonlabored breathing at rest. Cardiac: Regular rate and rhythm, no S3 or significant systolic murmur, no pericardial rub. Musculoskeletal: No kyphosis. Neuropsychiatric: Alert and oriented x3, affect grossly appropriate.  Recent Labwork: 07/23/2024: ALT 40; AST 21; BUN 16; Creatinine, Ser 1.07; Hemoglobin 13.4; Platelets 184; Potassium 4.4; Sodium 137     Component Value Date/Time   CHOL 151 02/06/2024 0834   CHOL 161 08/14/2023 0841   TRIG 148 02/06/2024 0834   HDL 45 02/06/2024 0834   HDL 34 (L) 08/14/2023 0841   CHOLHDL 3.4 02/06/2024 0834   VLDL 30 02/06/2024 0834   LDLCALC 76 02/06/2024 0834   LDLCALC 73 08/14/2023 0841     Assessment and Plan:   CAD manifested by SIHD with imaging evidence of flow-limiting mid to distal LAD disease s/p LAD PCI in 02/09/2024: No exertional chest pains but she has chest pains with anxiety.  EKG today showed NSR, nonspecific T wave inversions in the anterior leads (V2 and V3).  In the absence of symptoms,  will continue to monitor.   Continue DAPT, aspirin  81 mg daily and Plavix  75 mg once daily for total duration of 1 year.  Stop Plavix  on February 09, 2025.  Continue atorvastatin  40 mg nightly, fenofibrate 200 mg once daily.  Decrease metoprolol  tartrate dose from 25 mg to 12.5 mg twice daily.  Continue Ozempic .  Echocardiogram showed normal LVEF and no valvular heart disease.  Not exercising at home.  Referral to cardiac rehab.  HLD, hypertriglyceridemia: Lipid panel from March 2025 reviewed, TG 148 and LDL 76.  Both within goal.  Continue current medications, atorvastatin  40 mg nightly, fenofibrate 200 mg once daily.  Obtain LP(a) levels.  If elevated, need to start Repatha and recommend screening for first-degree relatives.  Dizziness:  She also has almost daily dizziness which she describes as letters becoming flowy when she tries to read. more like blurred vision.  She was not evaluated by ophthalmology recently.  This symptom is chronic and not new since starting Ozempic .  No indication for event monitor at this time.  Will decrease dose of metoprolol  at rate from 25 mg to 12.5 mg twice daily due to recent low blood pressures in the chart, 90 mmHg SBP.  Patient also lost more than 10 pounds after starting Ozempic .  HTN, controlled: Continue lisinopril 2.5 mg once daily.  Insulin-dependent diabetes mellitus type 2: Continue Ozempic .     30-minute spent in reviewing prior medical records, more than 3 labs, discussion and documentation  Medication Adjustments/Labs and Tests Ordered: Current medicines are reviewed at length with the patient today.  Concerns regarding medicines are outlined above.    Disposition:  Follow up 10 months  Signed Shinichi Anguiano Priya Romelia Bromell, MD, 11/19/2024 11:07 AM    [facility] at [facility] [address] [place], [place], [place] [address]

## 2024-11-20 ENCOUNTER — Encounter: Payer: Self-pay | Admitting: Internal Medicine

## 2024-11-23 ENCOUNTER — Other Ambulatory Visit: Payer: Self-pay | Admitting: Internal Medicine

## 2024-11-25 ENCOUNTER — Encounter: Payer: Self-pay | Admitting: Neurology

## 2024-11-29 ENCOUNTER — Ambulatory Visit: Payer: Self-pay | Admitting: Internal Medicine

## 2024-12-02 MED ORDER — REPATHA SURECLICK 140 MG/ML ~~LOC~~ SOAJ: 140.0000 mg | SUBCUTANEOUS | 2 refills | Status: AC

## 2024-12-02 NOTE — Telephone Encounter (Signed)
 The patient has been notified of the result and verbalized understanding.  All questions (if any) were answered. Littie CHRISTELLA Croak, CMA 12/02/2024 2:01 PM

## 2024-12-02 NOTE — Telephone Encounter (Signed)
-----   Message from Vishnu Mallipeddi, MD sent at 11/29/2024  1:08 PM EST ----- Lp-a mildly elevated. Start Repatha. Continue statin.

## 2024-12-03 ENCOUNTER — Telehealth: Payer: Self-pay | Admitting: Pharmacy Technician

## 2024-12-03 ENCOUNTER — Other Ambulatory Visit (HOSPITAL_COMMUNITY): Payer: Self-pay

## 2024-12-03 ENCOUNTER — Encounter: Payer: Self-pay | Admitting: Internal Medicine

## 2024-12-03 ENCOUNTER — Encounter: Payer: Self-pay | Admitting: Nurse Practitioner

## 2024-12-03 NOTE — Telephone Encounter (Signed)
 Hi, can someone please get the provider to sign the form that is scanned in media under Ozempic  refill request for this patient and fax back to [phone]? Thank you!     Starting 12/05/24 no longer available for medicare patients -she has medicare

## 2024-12-03 NOTE — Telephone Encounter (Signed)
 Pharmacy Patient Advocate Encounter  Received notification from [employer] that Prior Authorization for REpatha has been APPROVED from 06/04/24 to 12/04/24. Unable to obtain price due to refill too soon rejection, last fill date 12/03/24 next available fill date03/03/26   PA #/Case ID/Reference #: in media

## 2024-12-03 NOTE — Telephone Encounter (Signed)
 Pharmacy Patient Advocate Encounter   Received notification from Physician's Office that prior authorization for repatha is required/requested.   Insurance verification completed.   The patient is insured through [employer].   Per test claim: PA required; PA submitted to above mentioned insurance via Latent Key/confirmation #/EOC [facility] Status is pending

## 2024-12-03 NOTE — Telephone Encounter (Signed)
 See patient message, she says [employer] is needing us  to resend some forms.  And she can have some Toujeo  samples if we have them.

## 2024-12-03 NOTE — Telephone Encounter (Signed)
MD signed, faxed back

## 2024-12-04 ENCOUNTER — Telehealth: Payer: Self-pay | Admitting: *Deleted

## 2024-12-04 ENCOUNTER — Other Ambulatory Visit: Payer: Self-pay | Admitting: Family Medicine

## 2024-12-04 ENCOUNTER — Other Ambulatory Visit (HOSPITAL_COMMUNITY): Payer: Self-pay

## 2024-12-04 ENCOUNTER — Telehealth: Payer: Self-pay | Admitting: Pharmacy Technician

## 2024-12-04 DIAGNOSIS — F410 Panic disorder [episodic paroxysmal anxiety] without agoraphobia: Secondary | ICD-10-CM

## 2024-12-04 MED ORDER — ALPRAZOLAM 0.5 MG PO TABS: 0.5000 mg | ORAL_TABLET | Freq: Three times a day (TID) | ORAL | 0 refills | Status: DC | PRN

## 2024-12-04 NOTE — Telephone Encounter (Signed)
 I checked yesterday, we have a bunch.  She can have 2 Toujeo  pens to hold her over.

## 2024-12-04 NOTE — Telephone Encounter (Signed)
 Refill form faxed to [facility] for ozempic 

## 2024-12-04 NOTE — Telephone Encounter (Signed)
 Patient Advocate Encounter   The patient was approved for a [employer] grant that will help cover the cost of REPATHA Total amount awarded, 2500.  Effective: 11/04/24 - 11/03/25   APW:[id number] ERW:[id number] Hmnle:[id number] PI:[id number] [employer] ID: [id number]   Pharmacy provided with approval and processing information. Patient informed via mychart    Called and gave [employer]

## 2024-12-04 NOTE — Telephone Encounter (Signed)
 Patient has left a message stating that she had sent My Chart messages to [place]. She has called [employer] and they have advised her that our office needs to complete pages 7,8,9 of the application and resend it. We have sent our part in on 11/20/2024,pages 1-3. This is would be the clinical part.  She also ask if she could be given samples of insulin until she hears back from [employer]. They told her that once they got the information , she should be approved. She has Toujeo  and says that she has been rationing it to make it last. The last samples she says that she was given 3 boxed from us .  Patient is asking for acknowledgement of her call. Will call [employer] to see what they may need from us .

## 2024-12-04 NOTE — Telephone Encounter (Signed)
 The requested pages 7,8,9 from [employer] have been resent  (Faxed) to them. Patient was called and made aware that another physician had sent in a request to [employer], causing our practice to need and update those pages.

## 2024-12-04 NOTE — Telephone Encounter (Signed)
 Two samples of Toujeo  have been pulled and signed out. Front staff was made aware. Patient plans to come by later today or on Friday,12/06/2024 to pick up.

## 2024-12-06 ENCOUNTER — Telehealth: Payer: Self-pay | Admitting: Pharmacy Technician

## 2024-12-06 ENCOUNTER — Other Ambulatory Visit (HOSPITAL_COMMUNITY): Payer: Self-pay

## 2024-12-06 NOTE — Telephone Encounter (Signed)
 PA automatically renewed until 12/04/25:

## 2024-12-09 NOTE — Telephone Encounter (Signed)
"  See patient message   "

## 2024-12-10 ENCOUNTER — Institutional Professional Consult (permissible substitution) (INDEPENDENT_AMBULATORY_CARE_PROVIDER_SITE_OTHER): Admitting: Otolaryngology

## 2024-12-11 NOTE — Telephone Encounter (Signed)
 He picked the samples up on Monday.

## 2024-12-11 NOTE — Telephone Encounter (Signed)
 Patient's PAP was sent in. Meanwhile, she has been given samples per Benton Rio, NP instruction.

## 2024-12-16 ENCOUNTER — Institutional Professional Consult (permissible substitution) (INDEPENDENT_AMBULATORY_CARE_PROVIDER_SITE_OTHER): Admitting: Otolaryngology

## 2024-12-17 ENCOUNTER — Telehealth: Payer: Self-pay | Admitting: Pharmacy Technician

## 2024-12-17 ENCOUNTER — Encounter: Payer: Self-pay | Admitting: Nurse Practitioner

## 2024-12-17 NOTE — Telephone Encounter (Signed)
 Did we get any insulin for her yet?

## 2024-12-17 NOTE — Telephone Encounter (Signed)
Left message for patient to pick up

## 2024-12-17 NOTE — Telephone Encounter (Signed)
 Hi, per [facility] the ozempic  1mg  was delivered on 12/16/24 signed by elouise per [facility]. They said it was sent to :Address: [address], [place], [place], [address]. Can you please let the patient know if you have received this? Thank you!

## 2024-12-17 NOTE — Telephone Encounter (Signed)
Medication picked up by husband

## 2024-12-19 ENCOUNTER — Other Ambulatory Visit (HOSPITAL_COMMUNITY): Payer: Self-pay

## 2024-12-25 ENCOUNTER — Encounter: Payer: Self-pay | Admitting: Nurse Practitioner

## 2024-12-25 ENCOUNTER — Other Ambulatory Visit (HOSPITAL_COMMUNITY): Payer: Self-pay

## 2024-12-25 ENCOUNTER — Telehealth: Payer: Self-pay

## 2024-12-25 NOTE — Telephone Encounter (Signed)
 Patient needs PA for Dexcom G7, only has 1 left.

## 2024-12-25 NOTE — Telephone Encounter (Incomplete)
 Pharmacy Patient Advocate Encounter   Received notification from Pt Calls Messages that prior authorization for Dexcom G7 sensor is required/requested.   Insurance verification completed.   The patient is insured through [facility].   Per test claim: PA required and submitted KEY/EOC/Request #: [id number] due to  [employer] is not able to process this request through ePA, please contact the plan at [phone] or fax in request to [phone].   Called the number provided and submitted the request via phone. They stated we should receive a determination within 72 hours.  Case #: [id number]

## 2024-12-27 ENCOUNTER — Other Ambulatory Visit (HOSPITAL_COMMUNITY): Payer: Self-pay

## 2024-12-27 ENCOUNTER — Encounter: Payer: Self-pay | Admitting: Internal Medicine

## 2024-12-30 ENCOUNTER — Telehealth: Payer: Self-pay | Admitting: Pharmacy Technician

## 2024-12-30 NOTE — Telephone Encounter (Signed)
" ° ° °  Faxed [facility] refill form 12/30/24 "

## 2024-12-31 ENCOUNTER — Other Ambulatory Visit (HOSPITAL_COMMUNITY): Payer: Self-pay | Admitting: Family Medicine

## 2024-12-31 ENCOUNTER — Encounter (HOSPITAL_COMMUNITY): Payer: Self-pay

## 2024-12-31 DIAGNOSIS — Z1231 Encounter for screening mammogram for malignant neoplasm of breast: Secondary | ICD-10-CM

## 2025-01-01 ENCOUNTER — Telehealth: Payer: Self-pay | Admitting: Nurse Practitioner

## 2025-01-01 NOTE — Telephone Encounter (Signed)
 Let pt know PAP of Novolog  and Pen needles were ready for pick up

## 2025-01-02 ENCOUNTER — Encounter (HOSPITAL_COMMUNITY)

## 2025-01-02 ENCOUNTER — Telehealth: Payer: Self-pay | Admitting: *Deleted

## 2025-01-02 DIAGNOSIS — Z1231 Encounter for screening mammogram for malignant neoplasm of breast: Secondary | ICD-10-CM

## 2025-01-02 NOTE — Telephone Encounter (Signed)
 Pts husband picked up PAP of novolog  and pen needles

## 2025-01-02 NOTE — Telephone Encounter (Signed)
 Pt notified that 3 boxes of Ozempic  have arrived in the office for pick up.

## 2025-01-03 ENCOUNTER — Other Ambulatory Visit (HOSPITAL_COMMUNITY): Payer: Self-pay

## 2025-01-06 ENCOUNTER — Encounter: Payer: Self-pay | Admitting: Nurse Practitioner

## 2025-01-07 NOTE — Telephone Encounter (Signed)
 Pharmacy Patient Advocate Encounter   Received notification from Pt Calls Messages that prior authorization for Dexcom G7 sensor is required/requested.   Insurance verification completed.   The patient is insured through [facility].   Per test claim: PA required and submitted KEY/EOC/Request #: [id number] due to  [employer] is not able to process this request through ePA, please contact the plan at [phone] or fax in request to [phone].   Called the number provided and submitted the request via phone. They stated we should receive a determination within 72 hours.  Case #: [id number]

## 2025-01-07 NOTE — Telephone Encounter (Signed)
 Pharmacy Patient Advocate Encounter  Received notification from [facility] that Prior Authorization for Dexcom G7 sensor has been APPROVED from 01/03/2025 to 01/03/2026

## 2025-01-08 ENCOUNTER — Ambulatory Visit: Admitting: Nurse Practitioner

## 2025-01-08 ENCOUNTER — Other Ambulatory Visit: Payer: Self-pay | Admitting: Family Medicine

## 2025-01-08 DIAGNOSIS — F410 Panic disorder [episodic paroxysmal anxiety] without agoraphobia: Secondary | ICD-10-CM

## 2025-01-08 MED ORDER — ALPRAZOLAM 0.5 MG PO TABS: 0.5000 mg | ORAL_TABLET | Freq: Three times a day (TID) | ORAL | 0 refills | Status: AC | PRN

## 2025-01-08 NOTE — Telephone Encounter (Signed)
 Patient was called and made aware. She states that we had sent in for her Novolog  to PAP,([facility]) She is asking where can we sent for her Toujeo ?

## 2025-01-08 NOTE — Telephone Encounter (Signed)
 We can try for Tresiba with [facility] since she is already established with them.  I thought we did that.

## 2025-01-10 ENCOUNTER — Encounter (INDEPENDENT_AMBULATORY_CARE_PROVIDER_SITE_OTHER): Payer: Self-pay | Admitting: Otolaryngology

## 2025-01-10 ENCOUNTER — Ambulatory Visit (INDEPENDENT_AMBULATORY_CARE_PROVIDER_SITE_OTHER): Admitting: Otolaryngology

## 2025-01-10 VITALS — BP 136/76 | HR 72 | Ht 64.0 in | Wt 203.0 lb

## 2025-01-10 DIAGNOSIS — H903 Sensorineural hearing loss, bilateral: Secondary | ICD-10-CM | POA: Insufficient documentation

## 2025-01-10 DIAGNOSIS — H9313 Tinnitus, bilateral: Secondary | ICD-10-CM | POA: Insufficient documentation

## 2025-01-10 DIAGNOSIS — R42 Dizziness and giddiness: Secondary | ICD-10-CM

## 2025-01-10 NOTE — Progress Notes (Unsigned)
 CC: ***  Discussed the use of AI scribe software for clinical note transcription with the patient, who gave verbal consent to proceed.  History of Present Illness [name] is a 54 year old female with central vestibular dysfunction and bilateral sensorineural hearing loss who presents for evaluation of worsening dizziness.  Dizziness has been persistent for at least five years and has progressively worsened. She experiences both lightheadedness and episodic vertigo, with the spinning sensation occurring less frequently than a constant off-balance feeling. The off-balance sensation is present approximately 75% of the day, nearly every day. She has frequent falls at home, including one episode resulting in a forehead contusion, but has not required hospitalization or sustained fractures. She does not use assistive devices but has created paths in her home to aid mobility. She is unable to drive due to her dizziness.  Tinnitus is bilateral and has worsened over the past five years. She describes the ringing as high-pitched and screaming, occurring constantly, with some days worse than others. The tinnitus interferes with conversations, especially when people speak softly. She has not used hearing aids. She uses a TV app to generate white noise at night to help cope with the tinnitus.  She has difficulty hearing, particularly when others speak softly. She denies otalgia or otorrhea. Her hearing was last evaluated five years ago.  She previously underwent physical therapy for dizziness, including repeated testing for benign paroxysmal positional vertigo, which was negative. She was taught a home exercise involving sitting and turning her head, but was ultimately told by the therapist that further therapy would not be helpful. She underwent two hours of testing; her inner ear function was reported as normal. Her father has a history of benign paroxysmal positional vertigo.     Past Medical History:   Diagnosis Date   Allergy    Anxiety    Arthritis    BV (bacterial vaginosis)    BV   Cancer (HCC) 2009   papillary thyroid  cancer   Central vestibular vertigo    Depression    Diabetes mellitus without complication (HCC)    Family history of breast cancer    Heart murmur    Hyperlipidemia    Hypertension    Hypothyroidism    IBS (irritable bowel syndrome)    Neuromuscular disorder (HCC)    Thyroid  disease     Past Surgical History:  Procedure Laterality Date   ABLATION     CARPAL TUNNEL RELEASE     CARPAL TUNNEL RELEASE Right    CESAREAN SECTION     CORONARY STENT INTERVENTION N/A 02/09/2024   Procedure: CORONARY STENT INTERVENTION;  Surgeon: Anner Alm ORN, MD;  Location: [facility] IN[facility]ASIVE [facility];  Service: Cardiovascular;  Laterality: N/A;   CYST EXCISION N/A 09/12/2022   Procedure: CYST REMOVAL, NECK;  Surgeon: Kallie Manuelita BROCKS, MD;  Location: [facility];  Service: General;  Laterality: N/A;   LEFT HEART CATH AND CORONARY ANGIOGRAPHY N/A 02/09/2024   Procedure: LEFT HEART CATH AND CORONARY ANGIOGRAPHY;  Surgeon: Anner Alm ORN, MD;  Location: [facility] INVASIVE CV LAB;  Service: Cardiovascular;  Laterality: N/A;   LIPOMA EXCISION Left 09/12/2022   Procedure: EXCISION LIPOMA, THIGH;  Surgeon: Kallie Manuelita BROCKS, MD;  Location: [facility];  Service: General;  Laterality: Left;   NECK AND CHEST LESION     THYROIDECTOMY     Right and Left, 05/2008, 09/2008   TUBAL LIGATION     WISDOM TOOTH EXTRACTION      Family History  Problem Relation Age of Onset   Arthritis Paternal Grandmother    Breast cancer Maternal Grandmother        died in her late 42s   Cancer Maternal Grandmother    Hypertension Father    Hyperlipidemia Father    Diabetes Father    Arthritis Father    COPD Father    Kidney disease Father    Breast cancer Mother        died at age 19   Mental illness Mother    Alcohol abuse Mother    Cancer Mother    Depression Mother    Drug abuse Mother    Early death  Mother    Obesity Mother    Lung cancer Maternal Uncle    Cancer Maternal Uncle    Cancer Paternal Uncle    Diabetes Paternal Uncle    Early death Paternal Uncle    Arthritis Paternal Grandfather    Heart disease Paternal Grandfather    Anxiety disorder Paternal Aunt    Arthritis Paternal Aunt    COPD Paternal Aunt    Depression Paternal Aunt    Asthma Daughter    Hearing loss Brother     Social History:  reports that she has quit smoking. She has never used smokeless tobacco. She reports current alcohol use. She reports that she does not use drugs.  Allergies: Allergies[1]  Prior to Admission medications  Medication Sig Start Date End Date Taking? Authorizing Provider  ALPRAZolam  (XANAX ) 0.5 MG tablet Take 1 tablet (0.5 mg total) by mouth 3 (three) times daily as needed for anxiety. 01/10/25 02/09/25 Yes Alcus Oneil ORN, FNP  aspirin  EC 81 MG tablet Take 1 tablet (81 mg total) by mouth daily. Swallow whole. 03/14/24  Yes Mallipeddi, Vishnu P, MD  atorvastatin  (LIPITOR) 40 MG tablet TAKE 1 TABLET BY MOUTH EVERY DAY 11/25/24  Yes Mallipeddi, Vishnu P, MD  B-D UF III MINI PEN NEEDLES 31G X 5 MM MISC USE AND DISCARD 1 PEN      NEEDLE IN THE MORNING, AT   NOON, IN THE EVENING, AND  AT BEDTIME 02/21/23  Yes Reardon, Benton J, NP  B-D UF III MINI PEN NEEDLES 31G X 5 MM MISC USE AND DISCARD 1 PEN      NEEDLE 3 TIMES A DAY FOR   SUBCUTANEOUS INJECTION 08/02/23  Yes Therisa Benton J, NP  B-D UF III MINI PEN NEEDLES 31G X 5 MM MISC Use to inject insulin 4 times daily 09/26/23  Yes Reardon, Benton PARAS, NP  blood glucose meter kit and supplies KIT Dispense based on patient and insurance preference. Use up to four times daily as directed. 09/28/21  Yes Reardon, Benton PARAS, NP  butalbital -apap-caffeine -codeine (FIORICET WITH CODEINE) 50-325-40-30 MG capsule Take 1 capsule by mouth 2 (two) times daily. 09/18/24  Yes [provider]  CALCIUM  PO Take by mouth.   Yes [provider]   clopidogrel  (PLAVIX ) 75 MG tablet Take 1 tablet (75 mg total) by mouth daily. 11/19/24 02/09/25 Yes Mallipeddi, Vishnu P, MD  Continuous Glucose Sensor (DEXCOM G7 SENSOR) MISC Inject 1 Application into the skin as directed. Change sensor every 10 days as directed. 06/21/24  Yes Therisa Benton PARAS, NP  diphenhydrAMINE (BENADRYL) 25 MG tablet Take 25 mg by mouth daily as needed for allergies.   Yes [provider]  DULoxetine (CYMBALTA) 30 MG capsule Take 30 mg by mouth 2 (two) times daily. 09/24/21  Yes [provider]  Evolocumab  (REPATHA  SURECLICK) 140  MG/ML SOAJ Inject 140 mg into the skin every 14 (fourteen) days. 12/02/24  Yes Mallipeddi, Vishnu P, MD  fenofibrate micronized (LOFIBRA) 200 MG capsule Take 200 mg by mouth daily before breakfast.   Yes [provider]  gabapentin (NEURONTIN) 800 MG tablet Take 800 mg by mouth 4 (four) times daily. 09/17/21  Yes [provider]  HYDROcodone-acetaminophen  (NORCO) 10-325 MG tablet Take 1 tablet by mouth 3 (three) times daily as needed for severe pain (pain score 7-10).   Yes [provider]  hydrOXYzine (ATARAX/VISTARIL) 25 MG tablet Take 25 mg by mouth 4 (four) times daily as needed for itching. 10/13/18  Yes [provider]  insulin aspart  (NOVOLOG  FLEXPEN) 100 UNIT/ML FlexPen Inject 8-14 Units into the skin 3 (three) times daily with meals. 09/06/24  Yes Reardon, Benton PARAS, NP  insulin glargine , 2 Unit Dial, (TOUJEO  MAX SOLOSTAR) 300 UNIT/ML Solostar Pen Inject 50 Units into the skin at bedtime. 09/06/24  Yes Reardon, Benton PARAS, NP  Lancets (ONETOUCH DELICA PLUS LANCET33G) MISC Apply 1 each topically 4 (four) times daily. 09/28/21  Yes [provider]  levothyroxine  (SYNTHROID ) 175 MCG tablet Take 1 tablet (175 mcg total) by mouth daily before breakfast. 09/06/24  Yes Reardon, Benton J, NP  lisinopril (ZESTRIL) 2.5 MG tablet Take 2.5 mg by mouth daily. 09/24/21  Yes [provider]   meclizine (ANTIVERT) 25 MG tablet Take 25 mg by mouth 4 (four) times daily as needed for dizziness. 09/07/21  Yes [provider]  metoprolol  tartrate (LOPRESSOR ) 25 MG tablet Take 0.5 tablets (12.5 mg total) by mouth 2 (two) times daily. 11/19/24  Yes Mallipeddi, Vishnu P, MD  nitrofurantoin , macrocrystal-monohydrate, (MACROBID ) 100 MG capsule Take 1 capsule (100 mg total) by mouth 2 (two) times daily. 05/17/24  Yes Signa Nest A, NP  nitroGLYCERIN  (NITROSTAT ) 0.4 MG SL tablet Place 1 tablet (0.4 mg total) under the tongue every 5 (five) minutes as needed. 03/19/24  Yes Mallipeddi, Vishnu P, MD  Omega-3 Fatty Acids (FISH OIL PO) Take by mouth.   Yes [provider]  Semaglutide , 1 MG/DOSE, 4 MG/3ML SOPN Inject 1 mg into the skin once a week. Starting week 9, this will be your maintenance dose 08/13/24  Yes Mallipeddi, Vishnu P, MD  VITAMIN D  PO Take 1 capsule by mouth daily.   Yes [provider]  traMADol (ULTRAM) 50 MG tablet Take 100 mg by mouth 3 (three) times daily as needed for moderate pain. Patient not taking: Reported on 01/10/2025 09/07/21   [provider]    Blood pressure 136/76, pulse 72, height 5' 4 (1.626 m), weight 203 lb (92.1 kg), SpO2 96%. Exam: General: Communicates without difficulty, well nourished, no acute distress. Head: Normocephalic, no evidence injury, no tenderness, facial buttresses intact without stepoff. Face/sinus: No tenderness to palpation and percussion. Facial movement is normal and symmetric. Eyes: PERRL, EOMI. No scleral icterus, conjunctivae clear. Neuro: CN II exam reveals vision grossly intact.  No nystagmus at any point of gaze. Ears: Auricles well formed without lesions.  Ear canals are intact without mass or lesion.  No erythema or edema is appreciated.  The TMs are intact without fluid. Nose: External evaluation reveals normal support and skin without lesions.  Dorsum is intact.  Anterior rhinoscopy reveals congested  mucosa over anterior aspect of inferior turbinates and intact septum.  No purulence noted. Oral:  Oral cavity and oropharynx are intact, symmetric, without erythema or edema.  Mucosa is moist without lesions. Neck: Full range  of motion without pain.  There is no significant lymphadenopathy.  No masses palpable.  Thyroid  bed within normal limits to palpation.  Parotid glands and submandibular glands equal bilaterally without mass.  Trachea is midline. Neuro:  CN 2-12 grossly intact.   Assessment and Plan Assessment & Plan Central vestibular dysfunction Chronic central vestibular dysfunction with persistent dizziness and imbalance, progressively worsening over five years. Prior testing and clinical history exclude peripheral vestibular dysfunction. No pharmacologic or surgical interventions are available; management is focused on balance rehabilitation. - Referred to neurorehabilitation physical therapist in [place] with expertise in dizziness and balance disorders. - Advised use of assistive devices (cane or walker) for fall prevention due to frequent falls. - Scheduled physical therapy at [facility].  Bilateral sensorineural hearing loss with tinnitus Progressive bilateral sensorineural hearing loss with worsening tinnitus, likely secondary to hearing loss. No pharmacologic or surgical options for tinnitus; hearing aids may improve both auditory function and tinnitus suppression. - Ordered audiology evaluation to assess progression of hearing loss. - Discussed potential benefit of hearing aids for hearing loss and tinnitus suppression. - Provided counseling on tinnitus coping strategies, including use of white noise (fan, radio, TV, phone apps), particularly at night.       Dontai Pember W Darika Ildefonso 01/10/2025, 10:54 AM      [1]  Allergies Allergen Reactions   Aspirin      Upset stomach    Sulfa Antibiotics Hives   Sulfamethoxazole-Trimethoprim Hives and Rash    Bactrim

## 2025-01-10 NOTE — Telephone Encounter (Signed)
 Patient has been called and a message was left  sharing about the Novo Nordisk Application. This would be for the [employer]. Ask that she call our office back to let us  know what she would like to do.

## 2025-01-17 ENCOUNTER — Ambulatory Visit (INDEPENDENT_AMBULATORY_CARE_PROVIDER_SITE_OTHER): Admitting: Audiology

## 2025-01-24 ENCOUNTER — Ambulatory Visit: Payer: Self-pay | Admitting: Neurology

## 2025-01-29 ENCOUNTER — Ambulatory Visit (HOSPITAL_COMMUNITY): Admitting: Psychiatry

## 2025-02-06 ENCOUNTER — Ambulatory Visit: Admitting: Nurse Practitioner

## 2025-11-14 ENCOUNTER — Ambulatory Visit
